# Patient Record
Sex: Female | Born: 1965 | Race: Black or African American | Hispanic: No | Marital: Married | Smoking: Former smoker
Health system: Southern US, Community
[De-identification: ages and names within clinical notes are randomized; demographics above are authoritative.]

## PROBLEM LIST (undated history)

## (undated) DIAGNOSIS — E213 Hyperparathyroidism, unspecified: Secondary | ICD-10-CM

## (undated) DIAGNOSIS — D649 Anemia, unspecified: Secondary | ICD-10-CM

## (undated) DIAGNOSIS — G4700 Insomnia, unspecified: Secondary | ICD-10-CM

## (undated) DIAGNOSIS — D126 Benign neoplasm of colon, unspecified: Secondary | ICD-10-CM

## (undated) DIAGNOSIS — K589 Irritable bowel syndrome without diarrhea: Secondary | ICD-10-CM

## (undated) DIAGNOSIS — E8881 Metabolic syndrome: Secondary | ICD-10-CM

## (undated) DIAGNOSIS — K219 Gastro-esophageal reflux disease without esophagitis: Secondary | ICD-10-CM

## (undated) DIAGNOSIS — R011 Cardiac murmur, unspecified: Secondary | ICD-10-CM

## (undated) DIAGNOSIS — Z720 Tobacco use: Secondary | ICD-10-CM

## (undated) DIAGNOSIS — I10 Essential (primary) hypertension: Secondary | ICD-10-CM

## (undated) DIAGNOSIS — G5640 Causalgia of unspecified upper limb: Secondary | ICD-10-CM

## (undated) DIAGNOSIS — K648 Other hemorrhoids: Secondary | ICD-10-CM

## (undated) DIAGNOSIS — M5020 Other cervical disc displacement, unspecified cervical region: Secondary | ICD-10-CM

## (undated) DIAGNOSIS — F329 Major depressive disorder, single episode, unspecified: Secondary | ICD-10-CM

## (undated) DIAGNOSIS — G9050 Complex regional pain syndrome I, unspecified: Secondary | ICD-10-CM

## (undated) DIAGNOSIS — E785 Hyperlipidemia, unspecified: Secondary | ICD-10-CM

## (undated) DIAGNOSIS — E210 Primary hyperparathyroidism: Secondary | ICD-10-CM

## (undated) DIAGNOSIS — G43909 Migraine, unspecified, not intractable, without status migrainosus: Secondary | ICD-10-CM

## (undated) DIAGNOSIS — R739 Hyperglycemia, unspecified: Secondary | ICD-10-CM

## (undated) DIAGNOSIS — E669 Obesity, unspecified: Secondary | ICD-10-CM

## (undated) DIAGNOSIS — K56699 Other intestinal obstruction unspecified as to partial versus complete obstruction: Secondary | ICD-10-CM

## (undated) DIAGNOSIS — K56609 Unspecified intestinal obstruction, unspecified as to partial versus complete obstruction: Secondary | ICD-10-CM

## (undated) DIAGNOSIS — K3184 Gastroparesis: Secondary | ICD-10-CM

## (undated) HISTORY — DX: Hyperglycemia, unspecified: R73.9

## (undated) HISTORY — DX: Hyperlipidemia, unspecified: E78.5

## (undated) HISTORY — DX: Complex regional pain syndrome I, unspecified: G90.50

## (undated) HISTORY — DX: Migraine, unspecified, not intractable, without status migrainosus: G43.909

## (undated) HISTORY — PX: HERNIA REPAIR: SHX51

## (undated) HISTORY — DX: Other cervical disc displacement, unspecified cervical region: M50.20

## (undated) HISTORY — DX: Insomnia, unspecified: G47.00

## (undated) HISTORY — DX: Gastroparesis: K31.84

## (undated) HISTORY — DX: Hyperparathyroidism, unspecified: E21.3

## (undated) HISTORY — PX: BACK SURGERY: SHX140

## (undated) HISTORY — PX: COLLATERAL LIGAMENT REPAIR, KNEE: SHX601

## (undated) HISTORY — DX: Essential (primary) hypertension: I10

## (undated) HISTORY — PX: SHOULDER ARTHROSCOPY W/ ROTATOR CUFF REPAIR: SHX2400

## (undated) HISTORY — DX: Cardiac murmur, unspecified: R01.1

## (undated) HISTORY — DX: Major depressive disorder, single episode, unspecified: F32.9

## (undated) HISTORY — PX: MYOMECTOMY: SHX85

## (undated) HISTORY — DX: Gastro-esophageal reflux disease without esophagitis: K21.9

## (undated) HISTORY — DX: Unspecified intestinal obstruction, unspecified as to partial versus complete obstruction: K56.609

## (undated) HISTORY — DX: Anemia, unspecified: D64.9

## (undated) HISTORY — PX: LUMBAR DISC SURGERY: SHX700

## (undated) HISTORY — DX: Metabolic syndrome: E88.810

## (undated) HISTORY — DX: Tobacco use: Z72.0

## (undated) HISTORY — DX: Benign neoplasm of colon, unspecified: D12.6

## (undated) HISTORY — DX: Primary hyperparathyroidism: E21.0

## (undated) HISTORY — DX: Causalgia of unspecified upper limb: G56.40

## (undated) HISTORY — DX: Metabolic syndrome: E88.81

## (undated) HISTORY — DX: Other intestinal obstruction unspecified as to partial versus complete obstruction: K56.699

## (undated) HISTORY — DX: Obesity, unspecified: E66.9

## (undated) HISTORY — DX: Other hemorrhoids: K64.8

---

## 1993-08-13 HISTORY — PX: TUBAL LIGATION: SHX77

## 1996-08-13 HISTORY — PX: ULNAR NERVE REPAIR: SHX2594

## 2003-08-14 HISTORY — PX: OTHER SURGICAL HISTORY: SHX169

## 2003-10-12 ENCOUNTER — Encounter: Payer: Self-pay | Admitting: Family Medicine

## 2003-10-12 LAB — CONVERTED CEMR LAB: Pap Smear: NORMAL

## 2004-09-11 ENCOUNTER — Emergency Department (HOSPITAL_COMMUNITY): Admission: EM | Admit: 2004-09-11 | Discharge: 2004-09-11 | Payer: Self-pay | Admitting: *Deleted

## 2005-01-19 ENCOUNTER — Encounter: Payer: Self-pay | Admitting: Family Medicine

## 2005-05-22 ENCOUNTER — Ambulatory Visit: Payer: Self-pay | Admitting: Internal Medicine

## 2005-05-23 ENCOUNTER — Ambulatory Visit: Payer: Self-pay | Admitting: Internal Medicine

## 2005-05-30 ENCOUNTER — Ambulatory Visit: Payer: Self-pay | Admitting: Internal Medicine

## 2005-05-30 ENCOUNTER — Ambulatory Visit (HOSPITAL_COMMUNITY): Admission: RE | Admit: 2005-05-30 | Discharge: 2005-05-30 | Payer: Self-pay | Admitting: Internal Medicine

## 2005-10-24 ENCOUNTER — Emergency Department (HOSPITAL_COMMUNITY): Admission: EM | Admit: 2005-10-24 | Discharge: 2005-10-25 | Payer: Self-pay | Admitting: Emergency Medicine

## 2005-10-29 ENCOUNTER — Ambulatory Visit: Payer: Self-pay | Admitting: Internal Medicine

## 2005-11-16 ENCOUNTER — Encounter: Admission: RE | Admit: 2005-11-16 | Discharge: 2006-02-14 | Payer: Self-pay | Admitting: Internal Medicine

## 2005-12-06 ENCOUNTER — Encounter
Admission: RE | Admit: 2005-12-06 | Discharge: 2006-03-06 | Payer: Self-pay | Admitting: Physical Medicine and Rehabilitation

## 2005-12-06 ENCOUNTER — Ambulatory Visit: Payer: Self-pay | Admitting: Physical Medicine and Rehabilitation

## 2005-12-12 ENCOUNTER — Ambulatory Visit: Payer: Self-pay | Admitting: Internal Medicine

## 2005-12-21 ENCOUNTER — Encounter
Admission: RE | Admit: 2005-12-21 | Discharge: 2005-12-21 | Payer: Self-pay | Admitting: Physical Medicine and Rehabilitation

## 2006-02-24 ENCOUNTER — Encounter: Admission: RE | Admit: 2006-02-24 | Discharge: 2006-02-24 | Payer: Self-pay | Admitting: Neurosurgery

## 2006-03-13 ENCOUNTER — Ambulatory Visit: Payer: Self-pay | Admitting: Internal Medicine

## 2006-06-24 ENCOUNTER — Ambulatory Visit: Payer: Self-pay | Admitting: Family Medicine

## 2006-09-19 ENCOUNTER — Observation Stay (HOSPITAL_COMMUNITY): Admission: AD | Admit: 2006-09-19 | Discharge: 2006-09-20 | Payer: Self-pay | Admitting: Family Medicine

## 2006-09-19 ENCOUNTER — Ambulatory Visit: Payer: Self-pay | Admitting: Family Medicine

## 2006-09-20 ENCOUNTER — Ambulatory Visit: Payer: Self-pay | Admitting: Internal Medicine

## 2006-09-25 ENCOUNTER — Ambulatory Visit: Payer: Self-pay | Admitting: Family Medicine

## 2006-10-08 ENCOUNTER — Ambulatory Visit: Payer: Self-pay | Admitting: Internal Medicine

## 2006-10-08 ENCOUNTER — Encounter: Payer: Self-pay | Admitting: Family Medicine

## 2006-11-10 ENCOUNTER — Emergency Department (HOSPITAL_COMMUNITY): Admission: EM | Admit: 2006-11-10 | Discharge: 2006-11-10 | Payer: Self-pay | Admitting: Emergency Medicine

## 2006-12-02 ENCOUNTER — Encounter: Admission: RE | Admit: 2006-12-02 | Discharge: 2006-12-02 | Payer: Self-pay | Admitting: Neurosurgery

## 2006-12-16 ENCOUNTER — Encounter: Payer: Self-pay | Admitting: Family Medicine

## 2007-01-17 ENCOUNTER — Emergency Department (HOSPITAL_COMMUNITY): Admission: EM | Admit: 2007-01-17 | Discharge: 2007-01-18 | Payer: Self-pay | Admitting: Emergency Medicine

## 2007-01-20 ENCOUNTER — Encounter (INDEPENDENT_AMBULATORY_CARE_PROVIDER_SITE_OTHER): Payer: Self-pay | Admitting: *Deleted

## 2007-04-02 ENCOUNTER — Telehealth: Payer: Self-pay | Admitting: Family Medicine

## 2007-05-06 ENCOUNTER — Telehealth: Payer: Self-pay | Admitting: Family Medicine

## 2007-06-05 ENCOUNTER — Telehealth: Payer: Self-pay | Admitting: Family Medicine

## 2007-06-17 ENCOUNTER — Encounter: Payer: Self-pay | Admitting: Family Medicine

## 2007-06-17 DIAGNOSIS — E785 Hyperlipidemia, unspecified: Secondary | ICD-10-CM | POA: Insufficient documentation

## 2007-06-17 DIAGNOSIS — D649 Anemia, unspecified: Secondary | ICD-10-CM

## 2007-06-17 DIAGNOSIS — F329 Major depressive disorder, single episode, unspecified: Secondary | ICD-10-CM

## 2007-06-17 DIAGNOSIS — I10 Essential (primary) hypertension: Secondary | ICD-10-CM | POA: Insufficient documentation

## 2007-06-17 DIAGNOSIS — K219 Gastro-esophageal reflux disease without esophagitis: Secondary | ICD-10-CM

## 2007-06-17 DIAGNOSIS — G9050 Complex regional pain syndrome I, unspecified: Secondary | ICD-10-CM | POA: Insufficient documentation

## 2007-06-17 DIAGNOSIS — K3184 Gastroparesis: Secondary | ICD-10-CM

## 2007-06-17 DIAGNOSIS — M5020 Other cervical disc displacement, unspecified cervical region: Secondary | ICD-10-CM | POA: Insufficient documentation

## 2007-06-17 DIAGNOSIS — E669 Obesity, unspecified: Secondary | ICD-10-CM

## 2007-06-17 DIAGNOSIS — F17200 Nicotine dependence, unspecified, uncomplicated: Secondary | ICD-10-CM | POA: Insufficient documentation

## 2007-06-17 DIAGNOSIS — G4700 Insomnia, unspecified: Secondary | ICD-10-CM | POA: Insufficient documentation

## 2007-06-17 DIAGNOSIS — E8881 Metabolic syndrome: Secondary | ICD-10-CM

## 2007-06-18 ENCOUNTER — Ambulatory Visit: Payer: Self-pay | Admitting: Family Medicine

## 2007-06-18 DIAGNOSIS — R5383 Other fatigue: Secondary | ICD-10-CM

## 2007-06-18 DIAGNOSIS — R5381 Other malaise: Secondary | ICD-10-CM

## 2007-06-19 LAB — CONVERTED CEMR LAB
BUN: 10 mg/dL (ref 6–23)
Basophils Absolute: 0.1 10*3/uL (ref 0.0–0.1)
Basophils Relative: 0.6 % (ref 0.0–1.0)
CO2: 31 meq/L (ref 19–32)
Calcium: 10.4 mg/dL (ref 8.4–10.5)
Chloride: 99 meq/L (ref 96–112)
Creatinine, Ser: 0.8 mg/dL (ref 0.4–1.2)
Eosinophils Absolute: 0.3 10*3/uL (ref 0.0–0.6)
Eosinophils Relative: 2.2 % (ref 0.0–5.0)
GFR calc Af Amer: 103 mL/min
GFR calc non Af Amer: 85 mL/min
Glucose, Bld: 104 mg/dL — ABNORMAL HIGH (ref 70–99)
HCT: 42.5 % (ref 36.0–46.0)
Hemoglobin: 14.8 g/dL (ref 12.0–15.0)
Hgb A1c MFr Bld: 6.1 % — ABNORMAL HIGH (ref 4.6–6.0)
Lymphocytes Relative: 40.3 % (ref 12.0–46.0)
MCHC: 34.8 g/dL (ref 30.0–36.0)
MCV: 85.8 fL (ref 78.0–100.0)
Monocytes Absolute: 0.9 10*3/uL — ABNORMAL HIGH (ref 0.2–0.7)
Monocytes Relative: 7.5 % (ref 3.0–11.0)
Neutro Abs: 5.5 10*3/uL (ref 1.4–7.7)
Neutrophils Relative %: 49.4 % (ref 43.0–77.0)
Platelets: 284 10*3/uL (ref 150–400)
Potassium: 3.3 meq/L — ABNORMAL LOW (ref 3.5–5.1)
RBC: 4.95 M/uL (ref 3.87–5.11)
RDW: 12.9 % (ref 11.5–14.6)
Sodium: 138 meq/L (ref 135–145)
TSH: 2.55 microintl units/mL (ref 0.35–5.50)
Vitamin B-12: 441 pg/mL (ref 211–911)
WBC: 11.4 10*3/uL — ABNORMAL HIGH (ref 4.5–10.5)

## 2007-06-20 LAB — CONVERTED CEMR LAB
BUN: 10 mg/dL (ref 6–23)
Basophils Absolute: 0.1 10*3/uL (ref 0.0–0.1)
Basophils Relative: 0.6 % (ref 0.0–1.0)
CO2: 31 meq/L (ref 19–32)
Calcium: 10.4 mg/dL (ref 8.4–10.5)
Chloride: 99 meq/L (ref 96–112)
Cholesterol: 207 mg/dL (ref 0–200)
Creatinine, Ser: 0.8 mg/dL (ref 0.4–1.2)
Direct LDL: 102.4 mg/dL
Eosinophils Absolute: 0.3 10*3/uL (ref 0.0–0.6)
Eosinophils Relative: 2.2 % (ref 0.0–5.0)
GFR calc Af Amer: 103 mL/min
GFR calc non Af Amer: 85 mL/min
Glucose, Bld: 104 mg/dL — ABNORMAL HIGH (ref 70–99)
HCT: 42.5 % (ref 36.0–46.0)
HDL: 32.4 mg/dL — ABNORMAL LOW (ref >39.0)
Hemoglobin: 14.8 g/dL (ref 12.0–15.0)
Hgb A1c MFr Bld: 6.1 % — ABNORMAL HIGH (ref 4.6–6.0)
Lymphocytes Relative: 40.3 % (ref 12.0–46.0)
MCHC: 34.8 g/dL (ref 30.0–36.0)
MCV: 85.8 fL (ref 78.0–100.0)
Monocytes Absolute: 0.9 10*3/uL — ABNORMAL HIGH (ref 0.2–0.7)
Monocytes Relative: 7.5 % (ref 3.0–11.0)
Neutro Abs: 5.5 10*3/uL (ref 1.4–7.7)
Neutrophils Relative %: 49.4 % (ref 43.0–77.0)
Platelets: 284 10*3/uL (ref 150–400)
Potassium: 3.3 meq/L — ABNORMAL LOW (ref 3.5–5.1)
RBC: 4.95 M/uL (ref 3.87–5.11)
RDW: 12.9 % (ref 11.5–14.6)
Sodium: 138 meq/L (ref 135–145)
TSH: 2.55 microintl units/mL (ref 0.35–5.50)
Total CHOL/HDL Ratio: 6.4
Triglycerides: 361 mg/dL (ref 0–149)
VLDL: 72 mg/dL — ABNORMAL HIGH (ref 0–40)
Vitamin B-12: 441 pg/mL (ref 211–911)
WBC: 11.4 10*3/uL — ABNORMAL HIGH (ref 4.5–10.5)

## 2007-07-02 ENCOUNTER — Ambulatory Visit: Payer: Self-pay | Admitting: Family Medicine

## 2007-07-02 LAB — CONVERTED CEMR LAB
CRP, High Sensitivity: 10 — ABNORMAL HIGH (ref 0.00–5.00)
Mono Screen: NEGATIVE
Rheumatoid fact SerPl-aCnc: <20 intl units/mL — ABNORMAL LOW (ref 0.0–20.0)
Sed Rate: 10 mm/hr (ref 0–25)

## 2007-07-04 LAB — CONVERTED CEMR LAB: Anti Nuclear Antibody(ANA): NEGATIVE

## 2007-07-16 ENCOUNTER — Telehealth: Payer: Self-pay | Admitting: Family Medicine

## 2007-07-21 ENCOUNTER — Telehealth (INDEPENDENT_AMBULATORY_CARE_PROVIDER_SITE_OTHER): Payer: Self-pay | Admitting: *Deleted

## 2007-07-31 ENCOUNTER — Encounter: Admission: RE | Admit: 2007-07-31 | Discharge: 2007-08-13 | Payer: Self-pay | Admitting: Family Medicine

## 2007-08-19 ENCOUNTER — Telehealth: Payer: Self-pay | Admitting: Family Medicine

## 2007-08-22 ENCOUNTER — Telehealth: Payer: Self-pay | Admitting: Family Medicine

## 2007-09-11 ENCOUNTER — Telehealth: Payer: Self-pay | Admitting: Family Medicine

## 2007-09-18 ENCOUNTER — Encounter (INDEPENDENT_AMBULATORY_CARE_PROVIDER_SITE_OTHER): Payer: Self-pay | Admitting: *Deleted

## 2007-09-22 ENCOUNTER — Telehealth: Payer: Self-pay | Admitting: Family Medicine

## 2007-09-24 ENCOUNTER — Telehealth: Payer: Self-pay | Admitting: Family Medicine

## 2007-10-17 ENCOUNTER — Telehealth: Payer: Self-pay | Admitting: Family Medicine

## 2007-11-24 ENCOUNTER — Telehealth: Payer: Self-pay | Admitting: Family Medicine

## 2007-12-26 ENCOUNTER — Telehealth: Payer: Self-pay | Admitting: Family Medicine

## 2008-01-08 ENCOUNTER — Telehealth: Payer: Self-pay | Admitting: Family Medicine

## 2008-01-22 ENCOUNTER — Telehealth: Payer: Self-pay | Admitting: Family Medicine

## 2008-01-23 ENCOUNTER — Telehealth: Payer: Self-pay | Admitting: Family Medicine

## 2008-02-03 ENCOUNTER — Telehealth: Payer: Self-pay | Admitting: Family Medicine

## 2008-02-11 ENCOUNTER — Telehealth: Payer: Self-pay | Admitting: Family Medicine

## 2008-02-19 ENCOUNTER — Ambulatory Visit: Payer: Self-pay | Admitting: Family Medicine

## 2008-02-19 ENCOUNTER — Telehealth: Payer: Self-pay | Admitting: Family Medicine

## 2008-02-25 ENCOUNTER — Telehealth: Payer: Self-pay | Admitting: Family Medicine

## 2008-03-11 ENCOUNTER — Telehealth: Payer: Self-pay | Admitting: Family Medicine

## 2008-03-19 ENCOUNTER — Telehealth: Payer: Self-pay | Admitting: Family Medicine

## 2008-03-25 ENCOUNTER — Telehealth: Payer: Self-pay | Admitting: Family Medicine

## 2008-04-29 ENCOUNTER — Telehealth: Payer: Self-pay | Admitting: Family Medicine

## 2008-04-30 ENCOUNTER — Telehealth: Payer: Self-pay | Admitting: Family Medicine

## 2008-04-30 ENCOUNTER — Encounter: Payer: Self-pay | Admitting: Family Medicine

## 2008-05-05 ENCOUNTER — Ambulatory Visit: Payer: Self-pay | Admitting: Family Medicine

## 2008-05-11 ENCOUNTER — Telehealth: Payer: Self-pay | Admitting: Family Medicine

## 2008-05-19 ENCOUNTER — Ambulatory Visit: Payer: Self-pay | Admitting: Family Medicine

## 2008-05-19 DIAGNOSIS — H00029 Hordeolum internum unspecified eye, unspecified eyelid: Secondary | ICD-10-CM | POA: Insufficient documentation

## 2008-05-19 DIAGNOSIS — J309 Allergic rhinitis, unspecified: Secondary | ICD-10-CM | POA: Insufficient documentation

## 2008-05-20 ENCOUNTER — Telehealth: Payer: Self-pay | Admitting: Family Medicine

## 2008-05-26 ENCOUNTER — Ambulatory Visit: Payer: Self-pay | Admitting: Family Medicine

## 2008-06-04 ENCOUNTER — Encounter (INDEPENDENT_AMBULATORY_CARE_PROVIDER_SITE_OTHER): Payer: Self-pay | Admitting: *Deleted

## 2008-06-11 ENCOUNTER — Telehealth: Payer: Self-pay | Admitting: Family Medicine

## 2008-06-28 ENCOUNTER — Encounter (INDEPENDENT_AMBULATORY_CARE_PROVIDER_SITE_OTHER): Payer: Self-pay | Admitting: *Deleted

## 2008-07-06 ENCOUNTER — Telehealth: Payer: Self-pay | Admitting: Family Medicine

## 2008-07-09 ENCOUNTER — Telehealth: Payer: Self-pay | Admitting: Family Medicine

## 2008-07-12 ENCOUNTER — Telehealth: Payer: Self-pay | Admitting: Family Medicine

## 2008-07-14 ENCOUNTER — Ambulatory Visit: Payer: Self-pay | Admitting: Family Medicine

## 2008-07-14 DIAGNOSIS — R209 Unspecified disturbances of skin sensation: Secondary | ICD-10-CM

## 2008-07-19 LAB — CONVERTED CEMR LAB
ALT: 42 units/L — ABNORMAL HIGH (ref 0–35)
AST: 34 units/L (ref 0–37)
Albumin: 3.8 g/dL (ref 3.5–5.2)
Alkaline Phosphatase: 73 units/L (ref 39–117)
BUN: 9 mg/dL (ref 6–23)
Basophils Absolute: 0 10*3/uL (ref 0.0–0.1)
Basophils Relative: 0.4 % (ref 0.0–3.0)
Bilirubin, Direct: 0.1 mg/dL (ref 0.0–0.3)
CO2: 30 meq/L (ref 19–32)
Calcium: 10.1 mg/dL (ref 8.4–10.5)
Chloride: 104 meq/L (ref 96–112)
Cholesterol: 186 mg/dL (ref 0–200)
Creatinine, Ser: 0.8 mg/dL (ref 0.4–1.2)
Direct LDL: 103.5 mg/dL
Eosinophils Absolute: 0.2 10*3/uL (ref 0.0–0.7)
Eosinophils Relative: 2.9 % (ref 0.0–5.0)
Folate: 16.2 ng/mL
GFR calc Af Amer: 102 mL/min
GFR calc non Af Amer: 84 mL/min
Glucose, Bld: 90 mg/dL (ref 70–99)
HCT: 44.3 % (ref 36.0–46.0)
HDL: 38.5 mg/dL — ABNORMAL LOW (ref >39.0)
Hemoglobin: 15.5 g/dL — ABNORMAL HIGH (ref 12.0–15.0)
Lymphocytes Relative: 46.7 % — ABNORMAL HIGH (ref 12.0–46.0)
MCHC: 35.1 g/dL (ref 30.0–36.0)
MCV: 85.7 fL (ref 78.0–100.0)
Monocytes Absolute: 0.6 10*3/uL (ref 0.1–1.0)
Monocytes Relative: 6.9 % (ref 3.0–12.0)
Neutro Abs: 3.7 10*3/uL (ref 1.4–7.7)
Neutrophils Relative %: 43.1 % (ref 43.0–77.0)
Platelets: 244 10*3/uL (ref 150–400)
Potassium: 3.6 meq/L (ref 3.5–5.1)
RBC: 5.17 M/uL — ABNORMAL HIGH (ref 3.87–5.11)
RDW: 13.1 % (ref 11.5–14.6)
Sodium: 139 meq/L (ref 135–145)
TSH: 1.71 microintl units/mL (ref 0.35–5.50)
Total Bilirubin: 0.8 mg/dL (ref 0.3–1.2)
Total CHOL/HDL Ratio: 4.8
Total Protein: 7 g/dL (ref 6.0–8.3)
Triglycerides: 238 mg/dL (ref 0–149)
VLDL: 48 mg/dL — ABNORMAL HIGH (ref 0–40)
Vitamin B-12: 522 pg/mL (ref 211–911)
WBC: 8.5 10*3/uL (ref 4.5–10.5)

## 2008-08-05 ENCOUNTER — Telehealth: Payer: Self-pay | Admitting: Family Medicine

## 2008-08-16 ENCOUNTER — Telehealth: Payer: Self-pay | Admitting: Family Medicine

## 2008-08-16 ENCOUNTER — Encounter: Payer: Self-pay | Admitting: Family Medicine

## 2008-09-20 ENCOUNTER — Telehealth: Payer: Self-pay | Admitting: Family Medicine

## 2008-10-20 ENCOUNTER — Telehealth: Payer: Self-pay | Admitting: Family Medicine

## 2008-10-28 ENCOUNTER — Encounter (INDEPENDENT_AMBULATORY_CARE_PROVIDER_SITE_OTHER): Payer: Self-pay | Admitting: *Deleted

## 2008-10-29 ENCOUNTER — Telehealth: Payer: Self-pay | Admitting: Family Medicine

## 2008-11-05 ENCOUNTER — Telehealth: Payer: Self-pay | Admitting: Family Medicine

## 2008-11-11 ENCOUNTER — Telehealth: Payer: Self-pay | Admitting: Family Medicine

## 2008-11-19 ENCOUNTER — Telehealth: Payer: Self-pay | Admitting: Family Medicine

## 2008-12-10 ENCOUNTER — Ambulatory Visit: Payer: Self-pay | Admitting: Family Medicine

## 2008-12-10 DIAGNOSIS — H6690 Otitis media, unspecified, unspecified ear: Secondary | ICD-10-CM | POA: Insufficient documentation

## 2008-12-21 ENCOUNTER — Telehealth: Payer: Self-pay | Admitting: Family Medicine

## 2009-01-24 ENCOUNTER — Telehealth: Payer: Self-pay | Admitting: Family Medicine

## 2009-02-22 ENCOUNTER — Telehealth: Payer: Self-pay | Admitting: Family Medicine

## 2009-03-10 ENCOUNTER — Encounter: Payer: Self-pay | Admitting: Family Medicine

## 2009-03-18 ENCOUNTER — Encounter: Payer: Self-pay | Admitting: Family Medicine

## 2009-03-25 ENCOUNTER — Telehealth: Payer: Self-pay | Admitting: Family Medicine

## 2009-03-29 ENCOUNTER — Encounter: Payer: Self-pay | Admitting: Family Medicine

## 2009-03-30 ENCOUNTER — Telehealth: Payer: Self-pay | Admitting: Family Medicine

## 2009-04-19 ENCOUNTER — Telehealth: Payer: Self-pay | Admitting: Family Medicine

## 2009-04-27 ENCOUNTER — Telehealth: Payer: Self-pay | Admitting: Family Medicine

## 2009-05-12 ENCOUNTER — Telehealth: Payer: Self-pay | Admitting: Family Medicine

## 2009-05-19 ENCOUNTER — Ambulatory Visit: Payer: Self-pay | Admitting: Family Medicine

## 2009-05-19 DIAGNOSIS — H1045 Other chronic allergic conjunctivitis: Secondary | ICD-10-CM

## 2009-05-27 ENCOUNTER — Telehealth: Payer: Self-pay | Admitting: Family Medicine

## 2009-06-27 ENCOUNTER — Telehealth: Payer: Self-pay | Admitting: Family Medicine

## 2009-07-01 ENCOUNTER — Telehealth: Payer: Self-pay | Admitting: Family Medicine

## 2009-07-06 ENCOUNTER — Ambulatory Visit: Payer: Self-pay | Admitting: Family Medicine

## 2009-07-22 ENCOUNTER — Ambulatory Visit: Payer: Self-pay | Admitting: Family Medicine

## 2009-07-22 DIAGNOSIS — Z87898 Personal history of other specified conditions: Secondary | ICD-10-CM | POA: Insufficient documentation

## 2009-07-25 ENCOUNTER — Telehealth: Payer: Self-pay | Admitting: Family Medicine

## 2009-07-27 ENCOUNTER — Encounter: Payer: Self-pay | Admitting: Family Medicine

## 2009-08-01 ENCOUNTER — Telehealth: Payer: Self-pay | Admitting: Family Medicine

## 2009-08-10 ENCOUNTER — Telehealth: Payer: Self-pay | Admitting: Family Medicine

## 2009-08-15 ENCOUNTER — Telehealth: Payer: Self-pay | Admitting: Family Medicine

## 2009-08-22 ENCOUNTER — Telehealth (INDEPENDENT_AMBULATORY_CARE_PROVIDER_SITE_OTHER): Payer: Self-pay | Admitting: *Deleted

## 2009-08-26 ENCOUNTER — Telehealth: Payer: Self-pay | Admitting: Family Medicine

## 2009-09-21 ENCOUNTER — Telehealth: Payer: Self-pay | Admitting: Family Medicine

## 2009-10-05 ENCOUNTER — Telehealth: Payer: Self-pay | Admitting: Family Medicine

## 2010-05-01 ENCOUNTER — Encounter: Admission: RE | Admit: 2010-05-01 | Discharge: 2010-05-01 | Payer: Self-pay | Admitting: Gastroenterology

## 2010-05-08 ENCOUNTER — Encounter: Admission: RE | Admit: 2010-05-08 | Discharge: 2010-05-08 | Payer: Self-pay | Admitting: Gastroenterology

## 2010-05-15 ENCOUNTER — Ambulatory Visit (HOSPITAL_COMMUNITY): Admission: RE | Admit: 2010-05-15 | Discharge: 2010-05-15 | Payer: Self-pay | Admitting: Gastroenterology

## 2010-05-19 ENCOUNTER — Encounter: Admission: RE | Admit: 2010-05-19 | Discharge: 2010-05-19 | Payer: Self-pay | Admitting: Gastroenterology

## 2010-06-01 ENCOUNTER — Ambulatory Visit (HOSPITAL_COMMUNITY): Admission: RE | Admit: 2010-06-01 | Discharge: 2010-06-01 | Payer: Self-pay | Admitting: Gastroenterology

## 2010-09-03 ENCOUNTER — Encounter: Payer: Self-pay | Admitting: Physical Medicine and Rehabilitation

## 2010-09-12 NOTE — Progress Notes (Signed)
Summary: refill requests for kadian, MSIR  Phone Note Refill Request Call back at Home Phone [phone] Message from:  Patient  Refills Requested: Medication #1:  KADIAN 60 MG  CP24 1 tab by mouth two times a day ( in addition to 30 mg two times a day )  Medication #2:  KADIAN 30 MG XR24H-CAP 1 tab by mouth two times a day in addition to the 60 mg two times a day already using.  Medication #3:  MSIR 30 MG 1 tab po q 6 hours  as needed breakthru pain Phoned request from pt.  She is asking if she can have a few more msir this month because she has been using more due to the weather.  She needs more added to this script because her insurance will only pay for one script per month.  Please call when ready.  Initial call taken by: Lowella Petties CMA,  September 21, 2009 8:29 AM  Follow-up for Phone Call        Left message on cell phone voicemail  that Rx ready for pick up. Follow-up by: Linde Gillis CMA Duncan Dull),  September 21, 2009 10:37 AM    Prescriptions: MSIR 30 MG 1 tab po q 6 hours  as needed breakthru pain  #45 x 0   Entered and Authorized by:   Kerby Nora MD   Signed by:   Kerby Nora MD on 09/21/2009   Method used:   Print then Give to Patient   RxID:   [id number] KADIAN 60 MG  CP24 (MORPHINE SULFATE) 1 tab by mouth two times a day ( in addition to 30 mg two times a day )  #60 x 0   Entered and Authorized by:   Kerby Nora MD   Signed by:   Kerby Nora MD on 09/21/2009   Method used:   Print then Give to Patient   RxID:   [id number] KADIAN 30 MG XR24H-CAP (MORPHINE SULFATE) 1 tab by mouth two times a day in addition to the 60 mg two times a day already using.  #60 x 0   Entered and Authorized by:   Kerby Nora MD   Signed by:   Kerby Nora MD on 09/21/2009   Method used:   Print then Give to Patient   RxID:   [id number]

## 2010-09-12 NOTE — Progress Notes (Signed)
Summary: Followup on MSIR rx.  Phone Note Call from Patient Call back at [phone] or [phone]   Caller: Patient Call For: Kerby Nora MD Summary of Call: Pt called last week for MSIR rx. and did not get call back. I called [facility] is there and ready. I called pt apologized for not getting a call back last week but med is at [facility] and ready for pick up. Pt thanked me said that was OK and will pick up med today. Initial call taken by: Lewanda Rife LPN,  August 15, 2009 9:46 AM

## 2010-09-12 NOTE — Progress Notes (Signed)
Summary: MSIR   Phone Note Call from Patient Call back at [facility] Phone [phone] Call back at [phone]   Caller: Patient Call For: Dr. Ermalene Searing Summary of Call: pt calling upset that she has been waiting all week for return call from Dr. Ermalene Searing or Dr. Patsy Lager. Pt states that because of the weather she uses more pain medication because the weather makes the pain worse. Patient states that the pharmacy will not fill MSIR 30 MG 3 tab by mouth daily as needed breakthru pain, because she is using more than what the rx states. Pt would like for you to re-write the instructions to reflect she is taking more because of the weather. Pt can't get rx filled because she is due until 09/03/2009, pt uses [facility]. Initial call taken by: Mervin Hack CMA Duncan Dull),  August 26, 2009 4:39 PM  Follow-up for Phone Call        Will temporarily change MSIR prescription to 4 times daily, but if continue high use..will need to return to pain center for KAdian adjustment.  please discard other MSIR rx.Dajha Urquilla will need to come pick up MSIR prescription.  Follow-up by: Kerby Nora MD,  August 26, 2009 5:13 PM  Additional Follow-up for Phone Call Additional follow up Details #1::        Patient says that she would like a referral to pain clinic as she thought this was already in the process of being set up.  Rob from [place] to come by office to pick up Rx. Additional Follow-up by: Linde Gillis CMA Duncan Dull),  August 26, 2009 5:42 PM    New/Updated Medications: * MSIR 30 MG 1 tab po q 6 hours  as needed breakthru pain Prescriptions: MSIR 30 MG 1 tab po q 6 hours  as needed breakthru pain  #30 x 0   Entered and Authorized by:   Kerby Nora MD   Signed by:   Kerby Nora MD on 08/26/2009   Method used:   Print then Give to Patient   RxID:   [id number]

## 2010-09-12 NOTE — Progress Notes (Signed)
Summary: wants phone call   Phone Note Call from Patient Call back at [facility] Phone [phone] Call back at [phone]   Caller: Patient Call For: Kerby Nora MD/ Dr. Patsy Lager Summary of Call: Patient is requesting phone call from Dr. Patsy Lager regarding her pain medication.  Initial call taken by: Melody Comas,  August 22, 2009 4:32 PM  Follow-up for Phone Call        i am going to defer question about pain medicines to Dr. Ermalene Searing who is in tomorrow Follow-up by: Hannah Beat MD,  August 22, 2009 4:47 PM  Additional Follow-up for Phone Call Additional follow up Details #1::        Will call back when in office again on Friday...if she has specific concerns that need to be adressed sooner...take message and resend me a phone note.  Additional Follow-up by: Kerby Nora MD,  August 23, 2009 3:50 PM    Additional Follow-up for Phone Call Additional follow up Details #2::    patient says that she was worried about the weather but she has figured it out herself Follow-up by: Benny Lennert CMA Duncan Dull),  August 24, 2009 10:46 AM

## 2010-09-12 NOTE — Progress Notes (Signed)
Summary: needs records sent to pain mgmt clinic  Phone Note Call from Patient Call back at [facility] Phone [phone] Call back at [phone]   Caller: Patient Call For: Kerby Nora MD Summary of Call: Patient wants phone call from Dr. Daphine Deutscher nurse.  Initial call taken by: Melody Comas,  October 05, 2009 3:24 PM  Follow-up for Phone Call        Pt has found a pain management doctor at [facility].  His name is Sheran Luz and they need pt's records sent to them at fax number [phone].  He wants meds prescribed for the last few months, last office notes for the last few months. Follow-up by: Lowella Petties CMA,  October 05, 2009 3:57 PM  Additional Follow-up for Phone Call Additional follow up Details #1::        Please send requested info.  Additional Follow-up by: Kerby Nora MD,  October 05, 2009 5:16 PM    Additional Follow-up for Phone Call Additional follow up Details #2::    Faxed over 23 pages of documentation including complete medication list, and last three office visit notes beginning with the last note on 07/22/2009.  Faxed to Sheran Luz at [phone]. Follow-up by: Linde Gillis CMA Duncan Dull),  October 07, 2009 12:32 PM   Appended Document: needs records sent to pain mgmt clinic Patient notified that information was sent today via fax.

## 2010-09-12 NOTE — Progress Notes (Signed)
Summary: Kadian and MSIR rx  Phone Note Refill Request Call back at [phone] Message from:  Patient on August 26, 2009 2:44 PM  Refills Requested: Medication #1:  KADIAN 60 MG  CP24 1 tab by mouth two times a day ( in addition to 30 mg two times a day )  Medication #2:  KADIAN 30 MG XR24H-CAP 1 tab by mouth two times a day in addition to the 60 mg two times a day already using.  Medication #3:  MSIR 30 MG 3 tab by mouth daily as needed breakthru pain Pt request refill for Kadian and MSIR. Please call pt when ready for pick up at [phone].Thank you.   Method Requested: Pick up at Office Initial call taken by: Lewanda Rife LPN,  August 26, 2009 2:45 PM  Follow-up for Phone Call        Patient notified, Rx left at front desk for pickup Follow-up by: Linde Gillis CMA Duncan Dull),  August 26, 2009 3:31 PM    Prescriptions: MSIR 30 MG 3 tab by mouth daily as needed breakthru pain  #30 x 0   Entered and Authorized by:   Kerby Nora MD   Signed by:   Kerby Nora MD on 08/26/2009   Method used:   Print then Give to Patient   RxID:   [id number] KADIAN 30 MG XR24H-CAP (MORPHINE SULFATE) 1 tab by mouth two times a day in addition to the 60 mg two times a day already using.  #60 x 0   Entered and Authorized by:   Kerby Nora MD   Signed by:   Kerby Nora MD on 08/26/2009   Method used:   Print then Give to Patient   RxID:   [id number] KADIAN 60 MG  CP24 (MORPHINE SULFATE) 1 tab by mouth two times a day ( in addition to 30 mg two times a day )  #60 x 0   Entered and Authorized by:   Kerby Nora MD   Signed by:   Kerby Nora MD on 08/26/2009   Method used:   Print then Give to Patient   RxID:   [id number]

## 2010-12-26 NOTE — Assessment & Plan Note (Signed)
[facility]                                 ON-CALL NOTE   SHUNTIA, EXTON                    MRN:          [id number]  DATE:01/17/2007                            DOB:          1965/10/07    Phone number [phone].   CHIEF COMPLAINT:  Vomiting.  Patient says that she has been vomiting  since Tuesday, everything she eats.  She has just had sips of water  today.  Took 2 TUMS, and is still vomiting.  Now, she said she is  projectile vomiting with no control over it.  Denies diarrhea or fever.  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  She takes  morphine twice a day, and she thinks she is in withdrawal.  Her regular  doctor, Dr. Ermalene Searing, went out on maternity leave this week, and she said  she has not called the office because no one will believe her, and they  think she is just a drug seeker.  She said that she is making no tears  now when she cries.  Her lips are chapped.  She thinks she is very  dehydrated, and cannot stop vomiting.  I instructed her to go to the  emergency room for evaluation now, because she may need rehydration with  IV, and this is what she is going to do.     Marne A. Tower, MD  Electronically Signed    MAT/MedQ  DD: 01/17/2007  DT: 01/18/2007  Job #: [id number]   cc:   Kerby Nora, MD

## 2010-12-26 NOTE — Assessment & Plan Note (Signed)
[facility]                                 ON-CALL NOTE   PRUDIE, GUTHRIDGE                    MRN:          [id number]  DATE:01/18/2007                            DOB:          1966-06-10    A patient of Dr. Daphine Deutscher, at around 1 o'clock p.m., phone number is  [phone].   CHIEF COMPLAINT:  Vomiting.  The patient says that she has continued  nausea and vomiting from narcotic withdrawal.  She cannot take any  medications.  She went to the emergency room, they did nothing but give  her nausea medicine.  She said her nausea and vomiting has slowed down,  but she does not know what to do because she feels so bad and she knows  the vomiting will return.  She wants medicine to treat her withdrawal  from narcotics.  I told her I could not do this over the phone, and  advised her that if she if still having nausea and vomiting to go back  to the emergency room.  She said she would continue to use the nausea  medicine they gave her at the current time and go to the emergency room  if she gets worse.     Marne A. Tower, MD  Electronically Signed    MAT/MedQ  DD: 01/18/2007  DT: 01/18/2007  Job #: [id number]

## 2010-12-29 NOTE — Discharge Summary (Signed)
NAME:  Emma Munoz, Emma Munoz NO.:  [id number]   MEDICAL RECORD NO.:  [id number]          PATIENT TYPE:  OBV   LOCATION:  5524                         FACILITY:  [facility]   PHYSICIAN:  Valerie A. Felicity Coyer, MDDATE OF BIRTH:  21-Aug-1965   DATE OF ADMISSION:  09/19/2006  DATE OF DISCHARGE:  09/20/2006                               DISCHARGE SUMMARY   DISCHARGE DIAGNOSIS:  1. Nausea and vomiting likely secondary to gastroparesis and      constipation in the setting of chronic narcotic use.  2. Mild dehydration.  3. Mild tachycardia secondary to dehydration.  4. Chronic pain  5. Hypertension.  6. Hypokalemia.   HISTORY OF PRESENT ILLNESS:  Ms. Mckeon is a 45 year old female who  presented to her primary care's office on September 19, 2005, with a chief  complaint of intractable nausea and vomiting.  She has a known history  of gastroparesis. She is also followed in the pain clinic for chronic  pain after an injury to her left hand. She reports a one week history of  occasional nausea and vomiting which became worse on the evening prior  to admission.  She reported vomiting dark brown foul smelling copious  emesis which she believes was feces.  She has been unable to keep down  her pain medications for 48 hours prior to admission. She was admitted  for further evaluation and treatment.   PAST MEDICAL HISTORY:  1. Hypertension.  2. Depression.  3. Insomnia.  4. Chronic pain with history of reflex sympathetic dystrophy.  5. Gastroesophageal reflux disease.  6. Gastroparesis secondary to chronic pain medications.  7. Tobacco abuse.  8. Obesity.  9. Anemia.  10.Hypertriglyceridemia.  11.Metabolic syndrome/pre-diabetes.   COURSE OF HOSPITALIZATION:  1. Nausea and vomiting likely secondary to gastroparesis and      constipation in the setting of chronic narcotic use. The patient      was admitted and underwent a KUB which showed no acute abdominal      abnormalities  except probable constipation.  She also underwent a      chest x-ray which noted chronic bronchitis versus asthmatic      changes.  The patient did have a bowel movement and is currently      tolerating p.o.'s.  As she states she does not have daily bowel      movements and uses MiraLax p.r.n., it was recommended to the      patient to try MiraLax once daily to try to regulate bowel pattern      and prevent constipation   1. Hypokalemia.  The patient was noted to have a potassium of 3.1.      This was taken prior to IV fluid which had 20 mEq of potassium and      we will give an additional 20 mEq p.o. prior to discharge.   DISCHARGE MEDICATIONS:  1. MiraLax 17 grams once daily.  2. Kadian CR 60 mg twice daily.  3. Metoprolol/HCTZ 100/25 1 tablet p.o. daily.  4. Cymbalta 60 mg p.o. daily.  5. K-Dur 20 mEq p.o. daily.  6. Nexium 40  mEq p.o. daily.  7. Morphine sulfate 30 mg as needed per pain clinic instructions.  8. Diclopramide 10 mg p.o. b.i.d.  9. Chantix 1mg  p.o. b.i.d.  10.Flexeril 10 mg as needed.   DISPOSITION:  The patient will be discharged to home.   PERTINENT LABORATORIES AT TIME OF DISCHARGE:  Potassium 3.1, BUN 7,  creatinine 0.64, hemoglobin 15.3, hematocrit 44.8, lipase 16.   FOLLOW-UP:  The patient is instructed follow up with Dr. Ermalene Searing been  slow on February 13 at 11 a.m. She is instructed to call Dr. Ermalene Searing or  return to the emergency room should she develop recurrent nausea,  vomiting or inability to keep down food or medicines.      Sandford Craze, NP      Raenette Rover. Felicity Coyer, MD  Electronically Signed    MO/MEDQ  D:  09/20/2006  T:  09/20/2006  Job:  [id number]   cc:   Kerby Nora, MD

## 2010-12-29 NOTE — Assessment & Plan Note (Signed)
[facility]                                 ON-CALL NOTE   RAMYAH, PANKOWSKI                    MRN:          [id number]  DATE:12/07/2006                            DOB:          1966-03-22    TELEPHONE PROGRESS NOTE:  Telephone number is [phone].   PROGRESS NOTE:  Nursing assistant discussed with her, that the patient  complained of withdrawal from medicine of time released morphine because  there was a mix up on her medication. She has been out of her medicine  and the insurance would not fill it. She is having some problems with  body shaking, cold sweats, arm pain, and throwing up. I recommended that  she be evaluated through the emergency room. It is not possible to go to  a direct admit without observing the patient specifically. The patient  stated that she has had this happen before and Dr. Ermalene Searing admitted her  from the office for a similar thing. I recommended that today though,  because I am not her primary physician, she be evaluated in the  emergency room. I discussed with Dr. Ermalene Searing that this not happen again.     Neta Mends. Fabian Sharp, MD  Electronically Signed    WKP/MedQ  DD: 12/07/2006  DT: 12/07/2006  Job #: [id number]   cc:   Kerby Nora, MD
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
## 2010-12-29 NOTE — Assessment & Plan Note (Signed)
[facility]                           [place] OFFICE NOTE   Emma Munoz, Emma Munoz                    MRN:          [id number]  DATE:09/19/2006                            DOB:          09-Jan-1966    HOSPITAL ADMISSION   CHIEF COMPLAINT:  Intractable nausea and vomiting with history of  gastroparesis.   HISTORY OF PRESENT ILLNESS:  Emma Munoz is a 45 year old female with  history of GERD, gastroparesis, and chronic pain on chronic narcotics  who presents with 1 week of occasional nausea and vomiting and belching  which gradually worsened until last night when she began vomiting  repeatedly.  The emesis was dark brown, foul smelling, and copious.  She  did not see any blood.  She did state that it smelled like feces.  She  also reports epigastric and central abdominal cramping, moderate in  intensity.  She has not been able to keep down pain medications in 48  hours and she has also not been able to tolerate liquids since last  night.  Her last bowel movement was 2 days ago.  She has Phenergan and  metoclopramide at home that she should be taking regularly but she  cannot keep these down.  She has been hospitalized for similar symptoms  in the past.  She states that she has had workup of stomach emptying  which I do not have documentation of in the past that showed  gastroparesis.   REVIEW OF SYSTEMS:  Tachycardia, weak, fatigue.  No fever or chills.  No  sick contacts.  No diarrhea.  No blood in the stool.   PAST MEDICAL HISTORY:  1. Hypertension.  2. Depression.  3. Insomnia.  4. Chronic pain (reflex sympathetic dystrophy).  5. GERD.  6. Gastroparesis from chronic pain medication.  7. Tobacco abuse.  8. Obesity.  9. Anemia.  10.Hypertriglyceridemia.  11.Metabolic syndrome/prediabetes.   SURGICAL HISTORY:  1. 1998 hand crush injury with a power jack resulting in chronic pain.  2. Five hand and arm surgeries since 1998.  3.  Hysterectomy for fibroids, no BSO.  4. Knee surgery on the left.  5. Three back surgeries for herniated disk.  6. Three C-sections.  7. 2006 EGD negative.  8. Colonoscopy negative in 2006.   FAMILY HISTORY:  Father deceased at age 39 secondary to assault,  otherwise healthy.  Mother alive at age 20, healthy.  One brother with  end-stage renal disease.  No family history of cardiovascular disease or  diabetes.   SOCIAL HISTORY:  Smokes less than one pack per day, no alcohol, no  drugs.  She has been married for 15 years and is disabled secondary to  her hand injury.  She has three children.   ALLERGIES:  ASPIRIN, IBUPROFEN, AND ALL NSAIDS BECAUSE THEY CAUSE  TONGUE, THROAT AND EXTREMITY SWELLING.   MEDICATIONS:  1. Kadian 60 mg p.o. b.i.d.  2. MSIR 30 mg three to four tablets q.4-6 h.  3. Chantix.  4. Cymbalta 60 mg daily.  5. Metoprolol/hydrochlorothiazide 100/12.5 mg daily.  6. Flexeril 10 mg two tablets p.o. daily.  7.  Potassium 20 mEq one tablet p.o. daily.  8. Metoclopramide 5 mg q.i.d.  9. Nexium 40 mg daily.  10.Lunesta 2 mg p.o. q.h.s. p.r.n.  11.Vitamin B complex 100 mg daily.  12.Multivitamin Weight Smart daily.   PHYSICAL EXAMINATION:  VITAL SIGNS:  Weight 226, temperature 98.4, pulse  112, blood pressure 134/90.  GENERAL:  Mild distress secondary to nausea, morbid obesity.  HEENT:  PERRLA.  Extraocular muscles intact.  Oropharynx clear.  Tympanic membranes clear.  Mucous membranes dry.  No thyromegaly.  No  cervical or supraclavicular lymphadenopathy.  CARDIOVASCULAR:  Tachycardia, 1/6 systolic murmur, normal PMI, 2+  peripheral pulses, no peripheral edema.  PULMONARY:  Clear to auscultation bilaterally.  No wheezes, rales, or  rhonchi.  ABDOMEN:  Epigastric and central tenderness to palpation moderate.  No  rebound, no guarding, no hepatosplenomegaly.  Normoactive bowel sounds.  NEURO:  Alert and oriented x3.  Cranial nerves II-XII grossly intact.    ASSESSMENT AND PLAN:  A 45 year old female with:   Problem 1. INTRACTABLE NAUSEA AND VOMITING.  Given her symptoms, this is  likely secondary to gastroparesis and less likely viral gastroenteritis.  Given her foul-smelling dark stool, I will check an acute abdominal  series to rule out partial obstruction.  She will be treated with IV  Reglan four times a day with Phenergan p.r.n. nausea.  I will check  electrolytes, lipase, and LFTs.   Problem 2. DEHYDRATION, MILD.  IV fluids at D5 1/2-normal saline with 20  mEq KCl at 150 mL/hr for 3 hours then decrease to 125 mL/hr.   Problem 3. TACHYCARDIA.  Likely secondary to #2 and possibly early  narcotic withdrawal.   Problem 4.  CHRONIC PAIN.  We will give pain medications in IV form.  The equivalent of Kadian 60 mg p.o. b.i.d. and morphine as 40 mg IV  divided daily.  I will begin it with this dose and this may be increased  as needed to prevent withdrawal and control the patient's pain while she  is in the hospital until she is tolerating p.o. medications.   Problem 5. HYPERTENSION.  Blood pressure is well controlled at this  point in time.  Once nausea and vomiting is controlled, she may restart  on her oral medications.     Kerby Nora, MD  Electronically Signed    AB/MedQ  DD: 09/19/2006  DT: 09/19/2006  Job #: [id number]

## 2010-12-29 NOTE — Assessment & Plan Note (Signed)
[facility]                         GASTROENTEROLOGY OFFICE NOTE   Emma Munoz                    MRN:          [id number]  DATE:10/08/2006                            DOB:          [birth year]    CHIEF COMPLAINT:  Nausea and vomiting, constipation/gastroparesis.   ASSESSMENT:  A 45 year old [place] American woman with chronic narcotic  use, which has induced gastroparesis and constipation.  She is now  having more difficulty than before.   PLAN:  1. Try some enemas, 1 or 2, to help things move from below, and then      use MiraLax 4 to 8 doses over 2 hours or so to see if that will not      promote a better bowel movement and improve these symptoms.  She is      to take her metoclopramide before the attempt to prep with some      MiraLax.  If this fails to work, and in particular, she develops      protracted vomiting, hospitalization could be needed, as she may go      into narcotic withdraw.  Beyond that, other than with MiraLax, we      will add some Amitiza too to see if she can tolerate that.  If      those samples help, once she has some bowel movements here, then we      can prescribe that.  Ultimately, I think the narcotics are the big      issue, which she cannot come off of due to her reflex sympathetic      dystrophy and chronic pain syndrome.   I have also requested records from a workup when she was hospitalized  (has been hospitalized twice for these problems) at [facility] in [place], [place].  She believes she had an upper GI  endoscopy, a colonoscopy and a gastric emptying study.   HISTORY:  A 45 year old [place] woman, as described above.  She  has been on chronic narcotics since 2002 due to chronic pain syndrome.  She had the development of left ulnar neuropathy and reflex sympathetic  dystrophy problems in the left arm after an injury to her left hand.  She has had several surgeries there.   For the past 2 years, she has had  difficulty with vomiting.  She was hospitalized in 2005 in [place],  [place] and had the workup described above.  Since that time, she had some  intermittent symptoms, but lately, they have been quite steady, and she  has not moved in her bowels in about 2 weeks at this time.  She did take  an enema, and had some results about 2 weeks ago.  She feels very  distended, and said her weight will go up, and then decline as she ends  up defecating.  She was hospitalized from September 19, 2006 to September 20, 2006 on the inpatient  Service, and she said that she drank  quite a bit of MiraLax, and eventually moved her bowels and felt  somewhat better.   PAST MEDICAL HISTORY:  1.  As above.  2. Hysterectomy.  3. Four surgeries in the left arm 1998, 2001, 2002.  4. Left and right knee surgery.  5. Obesity.  6. Three caesarian sections.  7. Two back surgeries.  8. Multiple fibroids removed 1983.   MEDICATIONS:  1. Kadian 60 mg twice daily.  2. Morphine sulfate IR 30 mg 3 to 4 every 4 to 6 hours.  3. Chantix.  4. Cymbalta 60 mg daily.  5. Metoprolol/hydrochlorothiazide 100/12.5 mg daily.  6. Flexeril 20 mg daily.  7. Potassium 20 mEq daily.  8. Metoclopramide 10 mg 4 times a day.  9. Nexium 40 mg daily.  10.Vitamin B complex 100 mg daily.  11.Multivitamin.  12.Weight Smart daily.  13.MiraLax daily.  14.Lunesta 2 mg at bedtime as needed.  15.Pseudoephedrine p.r.n.   DRUG ALLERGIES:  1. ASPIRIN.  2. NSAIDS CAUSE SWELLING OF THE TONGUE, THROAT AND EXTREMITIES.   Additional medical history includes metabolic syndrome and prediabetes,  hypertriglyceridemia, history of anemia, insomnia.   FAMILY HISTORY:  Prostate cancer in a grandfather.  Otherwise, our  review is negative, including no family history of colon cancer.   SOCIAL HISTORY:  She is married, 1 son, 2 daughters.  Has a college  education.  She lives with her husband and children.   She does smoke,  trying to quit.  No alcohol, tobacco or drugs otherwise.   REVIEW OF SYSTEMS:  See above, or see my medical history form.   PHYSICAL EXAMINATION:  Reveals an obese back woman, in no acute  distress.  Height 5 feet 8 inches.  Weight 232 pounds.  Blood pressure 102/72.  Pulse 72.  The eyes are anicteric.  NECK:  Supple.  No thyromegaly or mass.  CHEST:  Clear.  HEART:  S1 and S2.  I hear no murmurs or gallops.  ABDOMEN:  Distended.  Bowel sounds are present.  I do not hear an  obvious succussion splash.  It is soft.  It is mildly tender throughout.  RECTAL:  Exam in the presence of female nursing staff shows no  impaction.  There is no significant stool in the rectum.  LYMPHATIC:  No neck or supraclavicular nodes.  EXTREMITIES:  No peripheral edema.  SKIN:  Warm and dry.  No acute rash.  NEUROLOGIC/PSYCHIATRIC:  She is alert and oriented x3.   Data from her hospitalization:  Her abdominal films showed no acute  abnormalities, except what they described as probable constipation.  A  chest x-ray showed a chronic bronchitis versus asthmatic changes.  During that hospitalization she was eventually able to tolerate p.o. and  was discharged.  Her hemoglobin was 15.  Potassium was 3.1, which was  supplemented.  BUN 7.  Creatinine 0.64.  Lipase was 16.   I appreciate the opportunity to care for this patient.   I have not scheduled followup for this lady right at this time.  We will  see her in followup if needed.  She is to call me back with the response  to the Amitiza, and I will prescribe that if it is working, and if not,  we will need to think of other techniques.  Ultimately, if it would be  at all possible for her to get some non-narcotic pain relief, I think  she should try to pursue that option.  Perhaps, getting established with  a pain clinic, presumably she already is.  I have not discussed that with her further at this time.     Emma Munoz.  Emma Payor, MD,FACG   Electronically Signed    CEG/MedQ  DD: 10/08/2006  DT: 10/08/2006  Job #: [id number]   cc:   Kerby Nora, MD

## 2010-12-29 NOTE — Assessment & Plan Note (Signed)
[facility]                             [place] OFFICE NOTE   Emma Munoz, Emma Munoz                    MRN:          [id number]  DATE:06/24/2006                            DOB:          07/18/1966    CHIEF COMPLAINT:  A 45 year old-black-female here to establish new doctor.   HISTORY OF PRESENT ILLNESS:  Emma Munoz states that she previously saw  Dr. Artist Pais at a different  office.  She is changing doctors because of  location.  She has the following health issues.  1. Hypertension, well-controlled:  She currently takes      metoprolol/hydrochlorothiazide 100/12.5 mg daily.  She denies any side      effects from this medication.  2. Depression, stable:  She is currently on Chantix 60 mg daily.  She      states that this is controlling her depression fairly well.  She denies      suicidal or homicidal ideation.  3. Chronic pain, poor control.  Emma Munoz states that gradually her      pain is under worse and worse control.  She currently has an      appointment upcoming with Dr. Pamelia Hoit at a chronic pain center in      early December to adjust her medications.  She is currently on Kadian      60 mg p.o. b.i.d. as well as MSIR 30 mg 3 to 4 tablets q.4 to 6 hours      for breakthrough pain.  She also takes Flexeril 10 mg 2 tablets daily.      Of note, she has had some gastroparesis that is most-likely secondary      to these medications.  She takes metoclopramide 5 mg q.i.d.   REVIEW OF SYSTEMS:  Otherwise, negative.   PAST MEDICAL HISTORY:  1. Hypertension.  2. Depression.  3. Insomnia.  4. Chronic pain, left neck to hand.  5. Gastroesophageal reflux disease.  6. Gastroparesis secondary to pain medicine.  7. Tobacco abuse.  8. Herniated disk in neck.   HOSPITALIZATIONS, SURGERIES, PROCEDURES:  1. In 1998 crush hand injury from a power jack resulting in all chronic      pain issues.  2. In 1998 five hand and arm  surgeries.  3. Hysterectomy for fibroids.  4. Left knee surgery.  5. Three back surgeries for herniated disks.  6. Three C-sections.  7. In 2006 EGD, negative.  8. Mammogram 2004, negative.  9. Colonoscopy 2006, negative.   ALLERGIES:  ASPIRIN, IBUPROFEN, and ALL NSAIDS cause tongue, throat, and  extremity swelling.   MEDICATIONS:  1. Kadian 60 mg p.o. b.i.d.  2. MSIR 30 mg 3 to 4 tablets q. 4 to 6 hours.  3. Chantix unknown dose.  4. Cymbalta 60 mg daily.  5. Metoprolol hydrochlorothiazide 100/12.5.  6. Flexeril 10 mg 2 tablets daily.  7. Potassium 20 mEq 1 tablet p.o. daily.  8. Metoclopramide 5 mg p.o. q.i.d.  9. Nexium 40 mg daily.  10.Lunesta 2 mg p.o. nightly p.r.n. insomnia.  11.Vitamin B complex 100 mg daily.  12.Multivitamins Weight Smart daily.  FAMILY HISTORY:  Father deceased at age 6 secondary to assault.  He was  otherwise healthy.  Mother alive at age 93, healthy.  She has 1 brother with  end-stage renal disease that sounds like it is related to strep and may be  post-streptococcal glomerulonephritis.  No family history of cardiovascular  disease or diabetes.  Her maternal grandfather did have prostate cancer.  There is no other family history of cancer.   SOCIAL HISTORY:  She smokes less than 1 pack per day.  She does not drink  alcohol.  She does not use drugs.  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  She has been married for 15 years.  She  denies verbal or physical abuse.  She has 3 children, 1 who has problems  with prediabetes and obesity.  She walks 3 times per week and goes to the  gym 2 days per week.  She eats fruits, vegetables, and lots of water, and is  trying to improve her diet.   PHYSICAL EXAM:  VITAL SIGNS:  Height 67 inches, weight 232, making BMI 37.  Blood pressure 116/82, pulse 80, temperature 98.3.  GENERAL:  Obese-appearing female in no apparent distress.  HEENT:  PERRLA.  Extraocular muscles are intact.   Oropharynx clear.  Tympanic membranes clear.  Nares clear.  No thyromegaly.  No lymphadenopathy.  CARDIOVASCULAR:  Regular rate and rhythm.  No murmurs, rubs, or gallops.  PULMONARY:  Clear to auscultation bilaterally.  No wheeze, rales, or  rhonchi.  ABDOMEN:  Soft and nontender.  Normoactive bowel sounds.  No  hepatosplenomegaly.  SKIN:  Multiple scars over left hand, wrist, and forearm, as well as a small  scar on the left shoulder and scarring on medial elbow.  EXTREMITIES:  With 2+ pulses bilateral lower extremities, slight swelling in  left upper hand, non-pitting.  Unable to squeeze more than 3/5 on the  strength scale in the left hand secondary to swelling and pain.  Otherwise,  strength throughout is 5/5.  NEURO:  Alert and oriented x3.  Cranial nerves 2-12 are grossly intact.  PSYCH:  No suicidal or homicidal ideation.   DIAGNOSES:  1. Hypertension, well-controlled:  Continue on current medication.  2. Depression, stable:  She will continue on Cymbalta at the current dose.  3. Chronic pain:  This is very extensive given her injury to her left hand      and the multiple surgeries.  It also appears that she is having some      pain in her neck secondary to herniated disk.  She will keep her      appointment with Dr. Pamelia Hoit regarding changing her Kadian and MSIR.      She may continue on Flexeril, the dose she is currently on.  I did warn      her that she is on a lot of sedating medications and she needs to be      careful.  4. Prevention:  She has not had any Pap smear secondary to having a      hysterectomy.  She refused a flu vaccine today.  She will determine      what dose her Chantix is and, if she is not on a full dose, we can      increase this.  She is encouraged to exercise and lose weight as well      as improve her diet.  It sounds as though she has had  blood sugar      previously at her other doctor's that suggested prediabetes.  She will     very likely need  to have this rechecked, as well as to determine when a      cholesterol panel has last been done.     Kerby Nora, MD    AB/MedQ  DD: 06/24/2006  DT: 06/24/2006  Job #: [id number]

## 2010-12-29 NOTE — Assessment & Plan Note (Signed)
[facility]                                 ON-CALL NOTE   JAKHIA, BUXTON                    MRN:          [id number]  DATE:11/10/2006                            DOB:          Oct 02, 1965    Caller is Ruthine Dose, her daughter, November 10, 2006 at 6:45 p.m.  Phone number [phone]. Regular doctor is Dr. Ermalene Searing. I am Dr Milinda Antis, on  call.   CHIEF COMPLAINT:  Back pain.   Porsche said she had just brought her mother to the emergency room at  [facility]. She suddenly had very severe back pain and was unable to  move. They had much difficulty moving her from the bed to get her to the  hospital, but she is in tremendous pain. She wanted to know if the wait  would be long in the emergency room and if there was anything I could do  to speed it up. I told her that it would take a while to be seen and  that more life-threatening cases tend to be seen in the emergency room  first so they may have a long wait, to try to be as organized as she can  about her mother's medicine list and medical problems and to call back  if they had any problems, and she said they would wait in the emergency  room to be seen.     Marne A. Tower, MD  Electronically Signed    MAT/MedQ  DD: 11/11/2006  DT: 11/11/2006  Job #: [id number]   cc:   Kerby Nora, MD

## 2010-12-29 NOTE — Assessment & Plan Note (Signed)
HISTORY OF PRESENT ILLNESS:  Emma Munoz is a 45 year old married black  female who was kindly referred by Dr. Artist Pais for evaluation of left upper  extremity pain. She was seen for the first time December 10, 2005. At that  evaluation, it was noted that she had increased lower extremity tone with  sustained clonus on the right as well as persistent left upper extremity  pain. She also had brisk reflexes throughout both lower extremities. An MRI  of cervical spine was obtained, which was done with [facility]. Read  by Dr. Pecolia Ades. A moderate size disk protrusion at C5-6 with extrusion of  disk material up behind C5 and down behind C6 was noted. There is also  significant mass effect on the anterior thecal sac, which Dr. Pecolia Ades felt  may be responsible for patient's radicular symptoms. This study was reviewed  with [name] and her husband today. Attempts were made to call the  patient. A phone message was left, however, [name] states that she  believes her daughter may have inadvertently erased the tape and she was  unable to contact our office because she did not know that we had left a  message.   [name] states that her pain continues to be about a 7 on a scale of  10 in the left upper extremity, involving from the shoulder region  throughout the elbow and into the hand area. The pain is described as sharp,  burning, stabbing, dull, tingling, and aching variably. Sleep is overall  poor. Repetitive motions typically exacerbate her pain. She does note she  has intermittent neck stiffness. Has difficulty looking behind her while she  is driving. She can walk essentially indefinitely. Does note some balance  problems, however, which are mild. She is able to climb stairs. She is  independent with her self care. Denis bowel or bladder control problems.  Denis depression or anxiety. Denies suicidal ideation.   PAST MEDICAL HISTORY/SOCIAL HISTORY/FAMILY HISTORY:   Denies any changes  since our last visit.   PHYSICAL EXAMINATION:  VITAL SIGNS:  Blood pressure 149/100, pulse 84,  respiratory rate 16, 98% saturated on room air.  GENERAL:  A well developed, well nourished, black female who appears her  stated age.  NEUROLOGIC:  Oriented x3. Affect bright and alert. Cooperative and pleasant.  Transitions from sit to stand without any difficulty. Gait overall is within  normal limits. Tandem gait, she has some trouble. Romberg's test, she had  some mild trouble as well. Slight decreased range of motion  in the cervical  spine. Full shoulder range of motion  is noted. Decreased sensation noted in  the left upper extremity with light touch. Motor strength in upper and lower  extremities is overall in the 5 over 5 range. Reflexes are brisk throughout.  She has some sustained clonus in the right. Few beats on the left are noted  in the lower extremities as well.   IMPRESSION:  1.  Status post left hand crush injury in 1999.  2.  Status post left ulnar transposition 1999 and 2001.  3.  Status post left carpal tunnel surgery 1999.  4.  Status post left digital release in little finger in 1999.  5.  Left shoulder surgery rotator cuff repair 2003.  6.  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   PLAN:  With the patient's history  of mild balance problems, brisk reflexes,  and right and left upper extremity pain, and with recent MRI of cervical  spine, which showed C5-6 disk extrusion, would like her to followup with  neurosurgery for further evaluation and possible treatment. The patient does  not need a refill on her morphine at this time. However, should she need  refills, we anticipate we will be taking over her narcotic medications.           ______________________________  Brantley Stage, M.D.     DMK/MedQ  D:  01/04/2006 14:21:40  T:  01/05/2006 14:59:18   Job #:  [id number]   cc:   Thomos Lemons, D.O. [facility]  [address] [place], [place] [address]

## 2011-05-31 LAB — I-STAT 8, (EC8 V) (CONVERTED LAB)
Acid-Base Excess: 1
BUN: 8
Bicarbonate: 27.6 — ABNORMAL HIGH
Chloride: 106
Glucose, Bld: 109 — ABNORMAL HIGH
HCT: 49 — ABNORMAL HIGH
Hemoglobin: 16.7 — ABNORMAL HIGH
Operator id: 277751
Potassium: 3.7
Sodium: 140
TCO2: 29
pCO2, Ven: 47.9
pH, Ven: 7.369 — ABNORMAL HIGH

## 2011-05-31 LAB — POCT I-STAT CREATININE
Creatinine, Ser: 0.8
Operator id: 277751

## 2011-07-14 HISTORY — PX: IMPLANTATION VAGAL NERVE STIMULATOR: SUR692

## 2011-10-16 ENCOUNTER — Telehealth: Payer: Self-pay | Admitting: Internal Medicine

## 2011-10-16 ENCOUNTER — Ambulatory Visit: Payer: Self-pay | Admitting: Internal Medicine

## 2011-10-16 NOTE — Telephone Encounter (Signed)
Please put her in first available slot.  We will need all notes.

## 2011-10-16 NOTE — Telephone Encounter (Signed)
The gerald kanda pa at [facility] called back Emma Munoz is unable to keep this appointment today because of transportation issues.  The pa wanted to know when we could get her worked in.  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 We need to call kim @ [facility] with appointment [phone]

## 2011-10-17 NOTE — Telephone Encounter (Signed)
Kim @ [facility] will let pt know her appointment date and time she will also fax over the office notes to [place] Correct number is [phone]

## 2011-10-19 ENCOUNTER — Ambulatory Visit (INDEPENDENT_AMBULATORY_CARE_PROVIDER_SITE_OTHER): Payer: Medicare Other | Admitting: Internal Medicine

## 2011-10-19 ENCOUNTER — Encounter: Payer: Self-pay | Admitting: Internal Medicine

## 2011-10-19 DIAGNOSIS — I10 Essential (primary) hypertension: Secondary | ICD-10-CM

## 2011-10-19 DIAGNOSIS — E119 Type 2 diabetes mellitus without complications: Secondary | ICD-10-CM

## 2011-10-19 DIAGNOSIS — R110 Nausea: Secondary | ICD-10-CM | POA: Insufficient documentation

## 2011-10-19 MED ORDER — GLUCOSE BLOOD VI STRP: ORAL_STRIP | Status: DC

## 2011-10-19 MED ORDER — GLIPIZIDE-METFORMIN HCL 2.5-500 MG PO TABS: 1.0000 | ORAL_TABLET | Freq: Two times a day (BID) | ORAL | Status: DC

## 2011-10-19 MED ORDER — FREESTYLE LANCETS MISC: Status: DC

## 2011-10-19 NOTE — Progress Notes (Signed)
Subjective:    Patient ID: Emma Munoz, female    DOB: 05-13-1966, 46 y.o.   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 She notes that over the last several years she has not been feeling well. She notes she has had numerous issues with her bowels including intermittent diarrhea and constipation. She reports extensive workup in the past for this. Etiology has not been determined. Last week, she was feeling particularly poorly in question if she might have a urinary tract infection. She went to urgent care and was found to have blood sugar greater than 500. Her hemoglobin A1c was 12%. She was started on metformin and scheduled in our office to establish care. She reports that she's been taking the metformin and denies any side effects noted from this medication. She was not given a glucometer but checked her blood sugar using her husband's monitor and found her sugar this morning to be 245. She questions how she could develop diabetes so quickly given that she had lab work less than 3 months ago which was completely normal. She questions whether there may be some secondary cause to her diabetes. She reports no family history of diabetes.  Outpatient Encounter Prescriptions as of 10/19/2011  Medication Sig Dispense Refill  . cyclobenzaprine (FLEXERIL) 10 MG tablet Take 20 mg by mouth at bedtime.      Marland Kitchen esomeprazole (NEXIUM) 40 MG capsule Take 40 mg by mouth daily.      . furosemide (LASIX) 40 MG tablet Take 40 mg by mouth daily. For elevated BP      . metoprolol-hydrochlorothiazide (LOPRESSOR HCT) 100-25 MG per tablet Take 1 tablet by mouth daily.      Marland Kitchen morphine (KADIAN) 80 MG 24 hr capsule Take 80 mg by mouth 2 (two) times daily.      Marland Kitchen morphine (MSIR) 30 MG tablet Take 30 mg by mouth as needed.      . Multiple Vitamins-Minerals (MULTIVITAMIN WITH MINERALS) tablet  Take 1 tablet by mouth daily.      . Vitamin D, Ergocalciferol, (DRISDOL) 50000 UNITS CAPS Take 50,000 Units by mouth 2 (two) times a week.      Marland Kitchen glipiZIDE-metformin (METAGLIP) 2.5-500 MG per tablet Take 1 tablet by mouth 2 (two) times daily before a meal.  60 tablet  3  . glucose blood (FREESTYLE LITE) test strip Test twice daily DX 250.02  100 each  3  . Lancets (FREESTYLE) lancets Use as instructed Twice daily DX: 250.02  100 each  12    Review of Systems  Constitutional: Negative for fever, chills, appetite change, fatigue and unexpected weight change.  HENT: Negative for ear pain, congestion, sore throat, trouble swallowing, neck pain, voice change and sinus pressure.   Eyes: Negative for visual disturbance.  Respiratory: Negative for cough, shortness of breath, wheezing and stridor.   Cardiovascular: Negative for chest pain, palpitations and leg swelling.  Gastrointestinal: Positive for nausea, abdominal pain, diarrhea, constipation and abdominal distention. Negative for vomiting, blood in stool and anal bleeding.  Genitourinary: Negative for dysuria and flank pain.  Musculoskeletal: Positive for myalgias, back pain and arthralgias. Negative for gait problem.  Skin: Negative for color change and rash.  Neurological: Negative for dizziness and headaches.  Hematological: Negative for adenopathy. Does not bruise/bleed easily.  Psychiatric/Behavioral: Negative for suicidal ideas, sleep disturbance and dysphoric mood. The patient is not nervous/anxious.  BP 140/90  Pulse 78  Temp(Src) 98.2 F (36.8 C) (Oral)  Ht 5\' 7"  (1.702 m)  Wt 225 lb (102.059 kg)  BMI 35.24 kg/m2  SpO2 98%     Objective:   Physical Exam  Constitutional: She is oriented to person, place, and time. She appears well-developed and well-nourished. No distress.  HENT:  Head: Normocephalic and atraumatic.  Right Ear: External ear normal.  Left Ear: External ear normal.  Nose: Nose normal.  Mouth/Throat:  Oropharynx is clear and moist. No oropharyngeal exudate.  Eyes: Conjunctivae are normal. Pupils are equal, round, and reactive to light. Right eye exhibits no discharge. Left eye exhibits no discharge. No scleral icterus.  Neck: Normal range of motion. Neck supple. No tracheal deviation present. No thyromegaly present.  Cardiovascular: Normal rate, regular rhythm, normal heart sounds and intact distal pulses.  Exam reveals no gallop and no friction rub.   No murmur heard. Pulmonary/Chest: Effort normal and breath sounds normal. No respiratory distress. She has no wheezes. She has no rales. She exhibits no tenderness.  Musculoskeletal: Normal range of motion. She exhibits no edema and no tenderness.  Lymphadenopathy:    She has no cervical adenopathy.  Neurological: She is alert and oriented to person, place, and time. No cranial nerve deficit. She exhibits normal muscle tone. Coordination normal.  Skin: Skin is warm and dry. No rash noted. She is not diaphoretic. No erythema. No pallor.  Psychiatric: She has a normal mood and affect. Her behavior is normal. Judgment and thought content normal.          Assessment & Plan:

## 2011-10-19 NOTE — Assessment & Plan Note (Signed)
As above, question if she may of had chronic pancreatitis. Will check lipase with labs. Will get abdominal ultrasound. Followup 2 weeks.

## 2011-10-19 NOTE — Assessment & Plan Note (Signed)
Blood pressure well-controlled today. We'll continue current medications. We'll check renal function with labs on Monday. Will consider changing to ACE inhibitor at next visit. Followup in 2 weeks.

## 2011-10-19 NOTE — Assessment & Plan Note (Addendum)
New diagnosis last week. Given sudden onset and symptoms of progressive nausea over months, question if she may of had chronic pancreatitis. Given that sugars are persistently elevated, will change to glipizide metformin. We discussed the use of insulin but she prefers not to start injectable medication at this time. She was given glucometer today and instructed on use. She will record her blood sugars twice daily and email with results. Goal fasting sugar will be 80-120. She will call if any blood sugars less than 80. We will check CMP, TSH, and lipase with her lab work fasting on Monday. She will have abdominal ultrasound to look at her pancreas. She will be referred to nutritionist for counseling. She will followup in 2 weeks.

## 2011-10-19 NOTE — Patient Instructions (Signed)
Check blood sugars twice daily.  Email results.  Follow up 2 weeks.

## 2011-10-22 ENCOUNTER — Encounter: Payer: Self-pay | Admitting: Internal Medicine

## 2011-10-22 ENCOUNTER — Other Ambulatory Visit (INDEPENDENT_AMBULATORY_CARE_PROVIDER_SITE_OTHER): Payer: Medicare Other | Admitting: *Deleted

## 2011-10-22 DIAGNOSIS — D649 Anemia, unspecified: Secondary | ICD-10-CM

## 2011-10-22 DIAGNOSIS — R110 Nausea: Secondary | ICD-10-CM

## 2011-10-22 DIAGNOSIS — E119 Type 2 diabetes mellitus without complications: Secondary | ICD-10-CM

## 2011-10-22 DIAGNOSIS — F329 Major depressive disorder, single episode, unspecified: Secondary | ICD-10-CM

## 2011-10-22 LAB — COMPREHENSIVE METABOLIC PANEL
ALT: 177 U/L — ABNORMAL HIGH (ref 0–35)
AST: 188 U/L — ABNORMAL HIGH (ref 0–37)
Albumin: 4 g/dL (ref 3.5–5.2)
Alkaline Phosphatase: 115 U/L (ref 39–117)
BUN: 9 mg/dL (ref 6–23)
CO2: 27 mEq/L (ref 19–32)
Calcium: 11 mg/dL — ABNORMAL HIGH (ref 8.4–10.5)
Chloride: 100 mEq/L (ref 96–112)
Creatinine, Ser: 0.8 mg/dL (ref 0.4–1.2)
GFR: 101.63 mL/min (ref >60.00)
Glucose, Bld: 207 mg/dL — ABNORMAL HIGH (ref 70–99)
Potassium: 3.7 mEq/L (ref 3.5–5.1)
Sodium: 135 mEq/L (ref 135–145)
Total Bilirubin: 0.5 mg/dL (ref 0.3–1.2)
Total Protein: 7.3 g/dL (ref 6.0–8.3)

## 2011-10-22 LAB — LIPID PANEL
Cholesterol: 214 mg/dL — ABNORMAL HIGH (ref 0–200)
HDL: 45.1 mg/dL (ref >39.00)
Total CHOL/HDL Ratio: 5
Triglycerides: 282 mg/dL — ABNORMAL HIGH (ref 0.0–149.0)
VLDL: 56.4 mg/dL — ABNORMAL HIGH (ref 0.0–40.0)

## 2011-10-22 LAB — LDL CHOLESTEROL, DIRECT: Direct LDL: 109.1 mg/dL

## 2011-10-22 LAB — TSH: TSH: 0.73 u[IU]/mL (ref 0.35–5.50)

## 2011-10-23 ENCOUNTER — Encounter: Payer: Self-pay | Admitting: Internal Medicine

## 2011-10-23 ENCOUNTER — Other Ambulatory Visit (INDEPENDENT_AMBULATORY_CARE_PROVIDER_SITE_OTHER): Payer: Medicare Other | Admitting: *Deleted

## 2011-10-23 ENCOUNTER — Other Ambulatory Visit: Payer: Self-pay | Admitting: Internal Medicine

## 2011-10-23 DIAGNOSIS — K7200 Acute and subacute hepatic failure without coma: Secondary | ICD-10-CM

## 2011-10-23 DIAGNOSIS — E039 Hypothyroidism, unspecified: Secondary | ICD-10-CM

## 2011-10-23 DIAGNOSIS — K759 Inflammatory liver disease, unspecified: Secondary | ICD-10-CM

## 2011-10-23 NOTE — Progress Notes (Signed)
Addended by: Jobie Quaker on: 10/23/2011 04:31 PM   Modules accepted: Orders

## 2011-10-24 ENCOUNTER — Other Ambulatory Visit: Payer: Self-pay | Admitting: *Deleted

## 2011-10-24 LAB — HEPATITIS B SURFACE ANTIBODY,QUALITATIVE: Hep B S Ab: NEGATIVE

## 2011-10-24 LAB — LIPASE: Lipase: 14 U/L (ref 0–75)

## 2011-10-24 LAB — HEPATITIS B CORE ANTIBODY, TOTAL: Hep B Core Total Ab: NEGATIVE

## 2011-10-24 LAB — HEPATITIS C ANTIBODY: HCV Ab: NEGATIVE

## 2011-10-24 LAB — COMPREHENSIVE METABOLIC PANEL
Albumin: 4.6 g/dL (ref 3.5–5.2)
Alkaline Phosphatase: 140 U/L — ABNORMAL HIGH (ref 39–117)
BUN: 10 mg/dL (ref 6–23)
Glucose, Bld: 144 mg/dL — ABNORMAL HIGH (ref 70–99)
Potassium: 4.2 mEq/L (ref 3.5–5.3)
Total Bilirubin: 0.6 mg/dL (ref 0.3–1.2)

## 2011-10-24 LAB — PROTIME-INR
INR: 0.93 (ref <1.50)
Prothrombin Time: 12.9 seconds (ref 11.6–15.2)

## 2011-10-24 LAB — TSH: TSH: 2.449 u[IU]/mL (ref 0.350–4.500)

## 2011-10-24 LAB — HEPATITIS A ANTIBODY, IGM: Hep A IgM: NEGATIVE

## 2011-10-24 MED ORDER — METOPROLOL-HYDROCHLOROTHIAZIDE 100-25 MG PO TABS: 1.0000 | ORAL_TABLET | Freq: Every day | ORAL | Status: DC

## 2011-10-25 ENCOUNTER — Encounter: Payer: Self-pay | Admitting: Internal Medicine

## 2011-10-25 LAB — CMV IGM: CMV IgM: 0.38 (ref <0.90)

## 2011-10-25 LAB — ANTI-MICROSOMAL ANTIBODY LIVER / KIDNEY: LKM1 Ab: <=20 U (ref <=20.0)

## 2011-10-26 ENCOUNTER — Telehealth: Payer: Self-pay | Admitting: Internal Medicine

## 2011-10-26 ENCOUNTER — Telehealth: Payer: Self-pay | Admitting: *Deleted

## 2011-10-26 DIAGNOSIS — R110 Nausea: Secondary | ICD-10-CM

## 2011-10-26 DIAGNOSIS — R748 Abnormal levels of other serum enzymes: Secondary | ICD-10-CM

## 2011-10-26 LAB — IGG, IGA, IGM: IgG (Immunoglobin G), Serum: 1200 mg/dL (ref 690–1700)

## 2011-10-26 LAB — CERULOPLASMIN: Ceruloplasmin: 40 mg/dL (ref 20–60)

## 2011-10-26 NOTE — Telephone Encounter (Signed)
Referral entered  

## 2011-10-26 NOTE — Telephone Encounter (Signed)
Message copied by Vernie Murders on Fri Oct 26, 2011  2:50 PM ------      Message from: Ronna Polio A      Created: Fri Oct 26, 2011  2:10 PM       Please bring her back for [facility].

## 2011-10-29 ENCOUNTER — Encounter: Payer: Self-pay | Admitting: Internal Medicine

## 2011-10-29 ENCOUNTER — Other Ambulatory Visit (INDEPENDENT_AMBULATORY_CARE_PROVIDER_SITE_OTHER): Payer: Medicare Other | Admitting: *Deleted

## 2011-10-29 ENCOUNTER — Ambulatory Visit: Payer: Self-pay | Admitting: Internal Medicine

## 2011-10-29 DIAGNOSIS — D649 Anemia, unspecified: Secondary | ICD-10-CM

## 2011-10-29 DIAGNOSIS — G4700 Insomnia, unspecified: Secondary | ICD-10-CM

## 2011-10-29 DIAGNOSIS — R110 Nausea: Secondary | ICD-10-CM

## 2011-10-29 DIAGNOSIS — F329 Major depressive disorder, single episode, unspecified: Secondary | ICD-10-CM

## 2011-10-30 ENCOUNTER — Telehealth: Payer: Self-pay | Admitting: Internal Medicine

## 2011-10-30 DIAGNOSIS — K8020 Calculus of gallbladder without cholecystitis without obstruction: Secondary | ICD-10-CM

## 2011-10-30 LAB — PTH, INTACT AND CALCIUM: Calcium, Total (PTH): 9.9 mg/dL (ref 8.4–10.5)

## 2011-10-30 NOTE — Telephone Encounter (Signed)
US abdomen was abnormal with multiple stones in the gallbladder and dilation of the common bile duct.  I would like to set her up with general surgery for evaluation for cholecystectomy.

## 2011-10-30 NOTE — Telephone Encounter (Signed)
Patient informed. 

## 2011-10-31 NOTE — Telephone Encounter (Signed)
Okay to schedule, new pt slot I do not see Ganem's notes in EP[facility]. They need to be available for review prior to her appt. Thanks

## 2011-11-01 ENCOUNTER — Telehealth: Payer: Self-pay | Admitting: Internal Medicine

## 2011-11-01 ENCOUNTER — Ambulatory Visit (INDEPENDENT_AMBULATORY_CARE_PROVIDER_SITE_OTHER): Payer: Medicare Other | Admitting: Internal Medicine

## 2011-11-01 ENCOUNTER — Encounter: Payer: Self-pay | Admitting: Internal Medicine

## 2011-11-01 VITALS — BP 158/86 | HR 110 | Temp 98.5°F | Wt 226.0 lb

## 2011-11-01 DIAGNOSIS — E1165 Type 2 diabetes mellitus with hyperglycemia: Secondary | ICD-10-CM

## 2011-11-01 DIAGNOSIS — K8020 Calculus of gallbladder without cholecystitis without obstruction: Secondary | ICD-10-CM | POA: Insufficient documentation

## 2011-11-01 DIAGNOSIS — E210 Primary hyperparathyroidism: Secondary | ICD-10-CM

## 2011-11-01 NOTE — Telephone Encounter (Signed)
Needs email about appointments.

## 2011-11-01 NOTE — Progress Notes (Signed)
Subjective:    Patient ID: Emma Munoz, female    DOB: 14-Dec-1965, 46 y.o.   MRN: [id number]  HPI 46 year old female with history of diabetes, depression, chronic pain, and intermittent episodes of nausea and abdominal pain presents for followup. She presents to discuss results of recent testing. Recent ultrasound of the abdomen showed gallstones but no evidence of cholecystitis. We discussed the potential of intermittent episodes of cholecystitis. She notes that over the last several years she has had intermittent episodes of nausea after eating fatty foods. This is associated with diffuse abdominal pain. This had not been evaluated in the past with ultrasound.  We also reviewed recent lab testing which showed elevated parathyroid hormone level and elevated calcium level. We discussed primary hyperparathyroidism. We discussed the potential symptoms associated with this disorder. Patient notes that for years, she has had fatigue, depression, and chronic bony pain. She notes that in the distant past her OB/GYN had noted her calcium and parathyroid hormone to be elevated, but no action was taken at that time.  In regards to her diabetes, she notes that blood sugars recently have been near 150-200. She reports full compliance with her medications.  Outpatient Encounter Prescriptions as of 11/01/2011  Medication Sig Dispense Refill  . cyclobenzaprine (FLEXERIL) 10 MG tablet Take 20 mg by mouth at bedtime.      Marland Kitchen esomeprazole (NEXIUM) 40 MG capsule Take 40 mg by mouth daily.      . furosemide (LASIX) 40 MG tablet Take 40 mg by mouth daily. For elevated BP      . glipiZIDE-metformin (METAGLIP) 2.5-500 MG per tablet Take 1 tablet by mouth 2 (two) times daily before a meal.  60 tablet  3  . glucose blood (FREESTYLE LITE) test strip Test twice daily DX 250.02  100 each  3  . Lancets (FREESTYLE) lancets Use as instructed Twice daily DX: 250.02  100 each  12  . metoprolol-hydrochlorothiazide (LOPRESSOR  HCT) 100-25 MG per tablet Take 1 tablet by mouth daily.  90 tablet  1  . morphine (KADIAN) 80 MG 24 hr capsule Take 80 mg by mouth 2 (two) times daily.      Marland Kitchen morphine (MSIR) 30 MG tablet Take 30 mg by mouth as needed.      . Multiple Vitamins-Minerals (MULTIVITAMIN WITH MINERALS) tablet Take 1 tablet by mouth daily.      . Vitamin D, Ergocalciferol, (DRISDOL) 50000 UNITS CAPS Take 50,000 Units by mouth 2 (two) times a week.        Review of Systems  Constitutional: Positive for fatigue. Negative for fever, chills, appetite change and unexpected weight change.  HENT: Negative for ear pain, congestion, sore throat, trouble swallowing, neck pain, voice change and sinus pressure.   Eyes: Negative for visual disturbance.  Respiratory: Negative for cough, shortness of breath, wheezing and stridor.   Cardiovascular: Negative for chest pain, palpitations and leg swelling.  Gastrointestinal: Positive for nausea, abdominal pain and constipation. Negative for vomiting, diarrhea, blood in stool, abdominal distention and anal bleeding.  Genitourinary: Negative for dysuria and flank pain.  Musculoskeletal: Positive for myalgias, back pain and arthralgias. Negative for gait problem.  Skin: Negative for color change and rash.  Neurological: Negative for dizziness and headaches.  Hematological: Negative for adenopathy. Does not bruise/bleed easily.  Psychiatric/Behavioral: Negative for suicidal ideas, sleep disturbance and dysphoric mood. The patient is not nervous/anxious.    BP 158/86  Pulse 110  Temp(Src) 98.5 F (36.9 C) (Oral)  Wt 226 lb (  102.513 kg)  SpO2 98%     Objective:   Physical Exam  Constitutional: She is oriented to person, place, and time. She appears well-developed and well-nourished. No distress.  HENT:  Head: Normocephalic and atraumatic.  Right Ear: External ear normal.  Left Ear: External ear normal.  Nose: Nose normal.  Mouth/Throat: Oropharynx is clear and moist. No  oropharyngeal exudate.  Eyes: Conjunctivae are normal. Pupils are equal, round, and reactive to light. Right eye exhibits no discharge. Left eye exhibits no discharge. No scleral icterus.  Neck: Normal range of motion. Neck supple. Carotid bruit is not present. No tracheal deviation present. No mass and no thyromegaly present.    Cardiovascular: Normal rate, regular rhythm, normal heart sounds and intact distal pulses.  Exam reveals no gallop and no friction rub.   No murmur heard. Pulmonary/Chest: Effort normal and breath sounds normal. No respiratory distress. She has no wheezes. She has no rales. She exhibits no tenderness.  Musculoskeletal: Normal range of motion. She exhibits no edema and no tenderness.  Lymphadenopathy:    She has no cervical adenopathy.  Neurological: She is alert and oriented to person, place, and time. No cranial nerve deficit. She exhibits normal muscle tone. Coordination normal.  Skin: Skin is warm and dry. No rash noted. She is not diaphoretic. No erythema. No pallor.  Psychiatric: She has a normal mood and affect. Her behavior is normal. Judgment and thought content normal.          Assessment & Plan:

## 2011-11-01 NOTE — Assessment & Plan Note (Signed)
Noted on ultrasound of abdomen. Symptoms are most consistent with recurrent episodes of cholecystitis. Will set up with general surgery for evaluation for cholecystectomy.

## 2011-11-01 NOTE — Patient Instructions (Addendum)
Surgery Appt Dr. Lemar Livings, 3/28 at 2pm  Parathyroid Hormone This is a test to determine whether PTH levels are responding normally to changes in blood calcium levels. It also helps to distinguish the cause of calcium imbalances, and to evaluate parathyroid function. When calcium blood levels are higher or lower than normal, and when your caregiver may want to determine how well your parathyroid glands are working. Parathyroid hormone (PTH) helps the body maintain stable levels of calcium in the blood. It is part of a 'feedback loop' that includes calcium, PTH, vitamin D, and, to some extent, phosphate and magnesium. Conditions and diseases that disrupt this feedback loop can cause inappropriate elevations or decreases in calcium and PTH levels and lead to symptoms of hypercalcemia (raised blood levels of calcium) or hypocalcemia (low blood levels of calcium).  PTH is produced by four parathyroid glands that are located in the neck beside the thyroid gland. Normally, these glands secrete PTH into the bloodstream in response to low blood calcium levels. Parathyroid hormone then works in three ways to help raise blood calcium levels back to normal. It takes calcium from the body's bone, stimulates the activation of vitamin D in the kidney (which in turn increases the absorption of calcium from the intestines), and suppresses the excretion of calcium in the urine (while encouraging excretion of phosphate). As calcium levels begin to increase in the blood, PTH normally decreases. PREPARATION FOR TEST You should have nothing to eat or drink except for water after midnight on the day of the test or as directed by your caregiver. A blood sample is obtained by inserting a needle into a vein in the arm. NORMAL FINDINGS Conventional Normal  PTH intact (whole)   Assay includes intact PTH   Values (pg/mL)10-65   SI Units (ng/L)10-65   PTH N-terminalN-terminal   Values (pg/mL) 8-24   SI Units (ng/L)8-24     PTH C-terminal   Assay Includes C-terminal   Values (pg/mL) 50-330   SI Units (ng/L) 50-330   Intact PTH   Midmolecule  Ranges for normal findings may vary among different laboratories and hospitals. You should always check with your doctor after having lab work or other tests done to discuss the meaning of your test results and whether your values are considered within normal limits. MEANING OF TEST  Your caregiver will go over the test results with you and discuss the importance and meaning of your results, as well as treatment options and the need for additional tests if necessary. OBTAINING THE TEST RESULTS  It is your responsibility to obtain your test results. Ask the lab or department performing the test when and how you will get your results. Document Released: 09/01/2004 Document Revised: 07/19/2011 Document Reviewed: 07/11/2008 [facility] Patient Information 2012 [place], [place].

## 2011-11-01 NOTE — Assessment & Plan Note (Signed)
Recent blood sugars continue to be elevated just above goal. We'll continue current medications for now and await evaluation of other ongoing issues. Patient will followup in one month.

## 2011-11-01 NOTE — Assessment & Plan Note (Signed)
Patient was noted to have elevated calcium on labs. Parathyroid hormone was also elevated at greater than 100. This is consistent with primary hyperparathyroidism. Discussed at length with patient today. Will set up referral to endocrinology. Given that this has been persistent for several years, question if it may have contributed to many of her symptoms. We also discussed that she may have bone loss and will need bone density testing. She will followup here in one month after seen by endocrinology.

## 2011-11-02 ENCOUNTER — Encounter: Payer: Self-pay | Admitting: Internal Medicine

## 2011-11-02 ENCOUNTER — Ambulatory Visit: Payer: Medicare Other | Admitting: Internal Medicine

## 2011-11-03 ENCOUNTER — Encounter: Payer: Self-pay | Admitting: Internal Medicine

## 2011-11-05 ENCOUNTER — Telehealth: Payer: Self-pay | Admitting: Internal Medicine

## 2011-11-05 ENCOUNTER — Ambulatory Visit: Payer: Medicare Other | Admitting: Internal Medicine

## 2011-11-05 ENCOUNTER — Encounter: Payer: Self-pay | Admitting: Internal Medicine

## 2011-11-05 NOTE — Telephone Encounter (Signed)
[facility] Triage Call Report Triage Record Num: [id number] Operator: Lesli Albee Patient Name: Emma Munoz Call Date & Time: 11/04/2011 8:31:27PM Patient Phone: [phone] PCP: Patient Gender: Female PCP Fax : Patient DOB: November 18, 1965 Practice Name: [facility] Reason for Call: Caller: [name]/Patient; PCP: Ronna Polio; CB#: [phone]; Call regarding Vomiting, shaking, abd. pain; Pt is shaky; abdominal pain and vomiting. Patient has gallstones. Pt has consultations this week for a parathyroid and gallbladder . Pt states she was supposed to call her MD if these sx occurred. Pt has been vomiting x 1 hour this time. She does not want to go to the hospital. Rn read note in EPIC concerning gallstones. Advised pt monitor for several hours if no improvement go to the hospital Protocol(s) Used: Nausea or Vomiting Recommended Outcome per Protocol: See Provider within 2 Weeks Reason for Outcome: Frequent episodes of indigestion/reflux Care Advice: Avoid the use of chocolate, peppermint, coffee, alcohol, and tobacco. These are irritants that may increase the reflux symptoms. ~ ~ SYMPTOM / CONDITION MANAGEMENT Heartburn Relief (Dietary): - Avoid overeating. - Eat smaller, more frequent meals and chew each bite thoroughly. - Avoid high fat, spicy or gas-producing foods. - Limit liquids/foods that are gastric irritants (fruit juices, caffeinated, carbonated or alcoholic beverages, chocolate and dairy products). - Eat at least three hours before bedtime. ~ Heartburn Relief (Positioning): - Avoid bending over at the waist or lying flat soon after eating. - Avoid clothing that is tight around the abdomen and waist. - Sleeping on stacked pillows or raising the head of bed on 6 inch blocks may help prevent reflux. ~ 11/04/2011 8:50:05PM Page 1 of 1 [id number]

## 2011-11-06 ENCOUNTER — Encounter: Payer: Self-pay | Admitting: Internal Medicine

## 2011-11-12 ENCOUNTER — Encounter: Payer: Self-pay | Admitting: Internal Medicine

## 2011-11-12 HISTORY — PX: CHOLECYSTECTOMY: SHX55

## 2011-11-13 ENCOUNTER — Ambulatory Visit: Payer: Self-pay | Admitting: General Surgery

## 2011-11-13 NOTE — Telephone Encounter (Signed)
Patient is scheduled with Dr. Rhea Belton

## 2011-11-13 NOTE — Telephone Encounter (Signed)
Patient emailed with appointment date and time.

## 2011-11-14 ENCOUNTER — Telehealth: Payer: Self-pay | Admitting: Internal Medicine

## 2011-11-14 NOTE — Telephone Encounter (Signed)
Cordelia Pen called in and states patient is suppose to have surgery on the 8th of April with Dr. Doristine Counter.  Cordelia Pen states that she had an abnormal EKG they are faxing over and they need surgical clearance. They would like for [place] to contact patient if she needs to be seen by [place].

## 2011-11-16 ENCOUNTER — Ambulatory Visit (INDEPENDENT_AMBULATORY_CARE_PROVIDER_SITE_OTHER): Payer: Medicare Other | Admitting: Cardiovascular Disease

## 2011-11-16 ENCOUNTER — Encounter: Payer: Self-pay | Admitting: Internal Medicine

## 2011-11-16 ENCOUNTER — Telehealth: Payer: Self-pay | Admitting: Cardiovascular Disease

## 2011-11-16 ENCOUNTER — Encounter: Payer: Self-pay | Admitting: Cardiovascular Disease

## 2011-11-16 ENCOUNTER — Telehealth: Payer: Self-pay | Admitting: Internal Medicine

## 2011-11-16 ENCOUNTER — Encounter: Payer: Self-pay | Admitting: *Deleted

## 2011-11-16 VITALS — BP 158/114 | HR 101 | Wt 223.0 lb

## 2011-11-16 DIAGNOSIS — Z01810 Encounter for preprocedural cardiovascular examination: Secondary | ICD-10-CM

## 2011-11-16 DIAGNOSIS — I10 Essential (primary) hypertension: Secondary | ICD-10-CM | POA: Insufficient documentation

## 2011-11-16 DIAGNOSIS — R9431 Abnormal electrocardiogram [ECG] [EKG]: Secondary | ICD-10-CM

## 2011-11-16 DIAGNOSIS — R079 Chest pain, unspecified: Secondary | ICD-10-CM

## 2011-11-16 NOTE — Telephone Encounter (Signed)
I called and spoke with patient to give her the appointment with Dr. Kirke Corin the cardiologist she wasn't aware that she had an abnormal EKG. I advised her that this was part of the surgical clearance.  She is going to see them today.

## 2011-11-16 NOTE — Telephone Encounter (Signed)
Cardiac clearance note faxed to [phone].

## 2011-11-16 NOTE — Assessment & Plan Note (Signed)
I reviewed her recent ECG. An isolated Q wave in lead 3 is not diagnostic for inferior MI. There is a tiny Q wave in aVF which is not pathologic. There is poor R wave progression in the anterior leads but this is not a specific finding for anterior MI. Based on the patient's history, admitting she had a prior myocardial infarction. She is able to do her physical activities without any reported exertional symptoms. Her functional capacity she seems to be good. Based on all of that, I think she can proceed with the planned surgery at an overall low risk for cardiovascular complications. Her blood pressure is elevated today but that seems to be related to discomfort in her left arm. Her dose of morphine was recently decreased and she is having discomfort at this time. I do recommend at some point evaluating her with a stress test which can be done actually after she recovered from that surgery just to clarify the ECG changes. She is not able to be steady on her feet on the treadmill due to her left hand injury and she will need a pharmacologic stress test.

## 2011-11-16 NOTE — Telephone Encounter (Signed)
Needs letter for surgical clearance on Monday Fax [phone]

## 2011-11-16 NOTE — Telephone Encounter (Signed)
I called and spoke with Diane at [phone] and she will refax the EKG now.

## 2011-11-16 NOTE — Telephone Encounter (Signed)
I have not see the EKG. We can have her repeat one here?

## 2011-11-16 NOTE — Telephone Encounter (Signed)
Thank you so much for your quick work on this! The abnormal EKG was not performed by [place], it was done by the surgeon and he called today about it.

## 2011-11-16 NOTE — Telephone Encounter (Signed)
Left VM for Marcelino Duster at surgeon's office b/c pt is scheduled for surgery Monday. I advised her to call cardiology for further info regarding clearance. Marcelino Duster at surgeons office # [phone]

## 2011-11-16 NOTE — Telephone Encounter (Signed)
They need to know if she is cleared for surgery she is scheduled for this Monday 11/19/11.  Eunice Blase would like [place] to call her today and let her know [phone].

## 2011-11-16 NOTE — Progress Notes (Signed)
HPI  This is a 46 year old female who is here today for preoperative cardiovascular evaluation for cholecystectomy to be done on Monday. She has history of type 2 diabetes which was only diagnosed 3 weeks ago. She also has history of hypertension and tobacco use. She reports previous left hand injury with significant discomfort and pain. She is on long acting morphine. She was recently found to have gallstones with some abdominal discomfort. She was referred for cholecystectomy. She had a preoperative ECG which showed sinus rhythm with a Q wave in lead 3 and poor R-wave progression in the anterior leads. The patient reports no previous history of myocardial infarction. She is not aware of any cardiac problems at all. She denies any chest pain or dyspnea whatsoever even with physical activities. She walks everyday about 1 mile with her dog. Most of the time she starts at a slow pace but she goes at a faster pace later and she is able to do that without any reported exertional symptoms.  Allergies  Allergen Reactions  . Aspirin     REACTION: Tongue, throat, extremities swell  . Ibuprofen     REACTION: Tongue, throat, extremities swell  . Iohexol      Desc: N&V after Gadolinium (Magnevist) injection, Onset Date: [id number]   . Nsaids     REACTION: Tongue, throat, extremities swell     Current Outpatient Prescriptions on File Prior to Visit  Medication Sig Dispense Refill  . cyclobenzaprine (FLEXERIL) 10 MG tablet Take 20 mg by mouth at bedtime.      Marland Kitchen esomeprazole (NEXIUM) 40 MG capsule Take 40 mg by mouth daily.      Marland Kitchen glipiZIDE-metformin (METAGLIP) 2.5-500 MG per tablet Take 1 tablet by mouth 2 (two) times daily before a meal.  60 tablet  3  . glucose blood (FREESTYLE LITE) test strip Test twice daily DX 250.02  100 each  3  . Lancets (FREESTYLE) lancets Use as instructed Twice daily DX: 250.02  100 each  12  . lisinopril (PRINIVIL,ZESTRIL) 20 MG tablet Take 20 mg by mouth daily.      .  metoprolol succinate (TOPROL-XL) 100 MG 24 hr tablet Take 100 mg by mouth daily. Take with or immediately following a meal.      . morphine (KADIAN) 80 MG 24 hr capsule Take 80 mg by mouth 2 (two) times daily.      Marland Kitchen morphine (MSIR) 30 MG tablet Take 30 mg by mouth as needed.      . Multiple Vitamins-Minerals (MULTIVITAMIN WITH MINERALS) tablet Take 1 tablet by mouth daily.      . Vitamin D, Ergocalciferol, (DRISDOL) 50000 UNITS CAPS Take 50,000 Units by mouth 2 (two) times a week.         Past Medical History  Diagnosis Date  . Diabetes mellitus   . Hypertension   . Tobacco abuse      Past Surgical History  Procedure Date  . Laparoscopic hysterectomy 2005  . Cesarean section 87, 87, 95  . Lumbar disc surgery     x2  . Knee surgery     Realignment surgery  . Hand surgery   . Ulnar nerve repair   . Rotator cuff repair   . Nerve stimulator in lumbar spine      Family History  Problem Relation Age of Onset  . Early death Father 59    GUNSHOT  . Kidney disease Brother     s/p transplant  . Heart disease Maternal  Grandmother     Heart defect  . Cancer Maternal Grandfather     Prostate     History   Social History  . Marital Status: Married    Spouse Name: N/A    Number of Children: 3  . Years of Education: N/A   Occupational History  . Not on file.   Social History Main Topics  . Smoking status: Current Everyday Smoker -- 1.0 packs/day  . Smokeless tobacco: Never Used  . Alcohol Use: Yes     Rarely  . Drug Use: No  . Sexually Active: Not on file   Other Topics Concern  . Not on file   Social History Narrative   Lives with husband and 17YO daughter. 2 dogs. Disabled after left arm injury.     ROS Constitutional: Negative for fever, chills, diaphoresis, activity change, appetite change and fatigue.  HENT: Negative for hearing loss, nosebleeds, congestion, sore throat, facial swelling, drooling, trouble swallowing, neck pain, voice change, sinus  pressure and tinnitus.  Eyes: Negative for photophobia, pain, discharge and visual disturbance.  Respiratory: Negative for apnea, cough, chest tightness, shortness of breath and wheezing.  Cardiovascular: Negative for chest pain, palpitations and leg swelling.  Gastrointestinal: Negative for nausea, vomiting, abdominal pain, diarrhea, constipation, blood in stool and abdominal distention.  Genitourinary: Negative for dysuria, urgency, frequency, hematuria and decreased urine volume.  Musculoskeletal: Negative for myalgias, back pain, joint swelling, arthralgias and gait problem.  Skin: Negative for color change, pallor, rash and wound.  Neurological: Negative for dizziness, tremors, seizures, syncope, speech difficulty, weakness, light-headedness, numbness and headaches.  Psychiatric/Behavioral: Negative for suicidal ideas, hallucinations, behavioral problems and agitation. The patient is not nervous/anxious.     PHYSICAL EXAM   BP 158/114  Pulse 101  Wt 223 lb (101.152 kg)  Constitutional: She is oriented to person, place, and time. She appears well-developed and well-nourished. No distress.  HENT: No nasal discharge.  Head: Normocephalic and atraumatic.  Eyes: Pupils are equal and round. Right eye exhibits no discharge. Left eye exhibits no discharge.  Neck: Normal range of motion. Neck supple. No JVD present. No thyromegaly present.  Cardiovascular: Normal rate, regular rhythm, normal heart sounds. Exam reveals no gallop and no friction rub. No murmur heard.  Pulmonary/Chest: Effort normal and breath sounds normal. No stridor. No respiratory distress. She has no wheezes. She has no rales. She exhibits no tenderness.  Abdominal: Soft. Bowel sounds are normal. She exhibits no distension. There is no tenderness. There is no rebound and no guarding.  Musculoskeletal: Normal range of motion. She exhibits no edema and no tenderness.  Neurological: She is alert and oriented to person, place,  and time. Coordination normal.  Skin: Skin is warm and dry. No rash noted. She is not diaphoretic. No erythema. No pallor.  Psychiatric: She has a normal mood and affect. Her behavior is normal. Judgment and thought content normal.     EKG: Normal sinus rhythm isolated Q wave in III. There is a tiny Q wave in aVF which does not appear to be pathologic. There is poor R wave progression in the anterior leads.   ASSESSMENT AND PLAN

## 2011-11-16 NOTE — Patient Instructions (Signed)
Your physician recommends that you schedule a follow-up appointment in: 1 month after you have stress test @ [facility]. I will call you with the date and time of there stress test  Your physician has requested that you have a lexiscan myoview THIS WILL BE DONE @ [facility], I WILL CALL YOU WITH THE DATE AND TIME OF THE STRESS TEST. For further information please visit [email or web address] Please follow instruction sheet, as given.  PER DR. Kirke Corin YOU HAVE BEEN CLEARED TO HAVE YOUR SURGERY Monday 11/19/11

## 2011-11-19 ENCOUNTER — Ambulatory Visit: Payer: Self-pay | Admitting: General Surgery

## 2011-11-20 NOTE — Telephone Encounter (Signed)
You are welcome and I am glad we could get the patient seen so quickly.

## 2011-11-21 ENCOUNTER — Inpatient Hospital Stay: Payer: Self-pay | Admitting: General Surgery

## 2011-11-21 LAB — COMPREHENSIVE METABOLIC PANEL
Anion Gap: 11 (ref 7–16)
BUN: 11 mg/dL (ref 7–18)
Bilirubin,Total: 1.6 mg/dL — ABNORMAL HIGH (ref 0.2–1.0)
Calcium, Total: 11.4 mg/dL — ABNORMAL HIGH (ref 8.5–10.1)
Chloride: 101 mmol/L (ref 98–107)
EGFR (Non-African Amer.): >60
Glucose: 150 mg/dL — ABNORMAL HIGH (ref 65–99)
Osmolality: 274 (ref 275–301)
SGPT (ALT): 75 U/L
Sodium: 136 mmol/L (ref 136–145)

## 2011-11-21 LAB — CBC WITH DIFFERENTIAL/PLATELET
Eosinophil #: 0.1 10*3/uL (ref 0.0–0.7)
Eosinophil %: 0.5 %
HGB: 18.7 g/dL — ABNORMAL HIGH (ref 12.0–16.0)
MCH: 28.9 pg (ref 26.0–34.0)
MCV: 88 fL (ref 80–100)
Monocyte #: 1.2 x10 3/mm — ABNORMAL HIGH (ref 0.2–0.9)
Monocyte %: 7.1 %
Neutrophil %: 82.4 %
Platelet: 294 10*3/uL (ref 150–440)
RBC: 6.47 10*6/uL — ABNORMAL HIGH (ref 3.80–5.20)

## 2011-11-21 LAB — BILIRUBIN, DIRECT: Bilirubin, Direct: 0.5 mg/dL — ABNORMAL HIGH

## 2011-11-22 LAB — BASIC METABOLIC PANEL
Anion Gap: 10 (ref 7–16)
Calcium, Total: 10.8 mg/dL — ABNORMAL HIGH (ref 8.5–10.1)
Chloride: 103 mmol/L (ref 98–107)
Co2: 24 mmol/L (ref 21–32)
Creatinine: 0.52 mg/dL — ABNORMAL LOW (ref 0.60–1.30)
Osmolality: 276 (ref 275–301)
Sodium: 137 mmol/L (ref 136–145)

## 2011-11-22 LAB — HEPATIC FUNCTION PANEL A (ARMC)
Albumin: 3.3 g/dL — ABNORMAL LOW (ref 3.4–5.0)
Alkaline Phosphatase: 102 U/L (ref 50–136)
Bilirubin, Direct: 0.4 mg/dL — ABNORMAL HIGH (ref 0.00–0.20)
SGOT(AST): 72 U/L — ABNORMAL HIGH (ref 15–37)

## 2011-11-22 LAB — CBC WITH DIFFERENTIAL/PLATELET
Basophil #: 0 10*3/uL (ref 0.0–0.1)
Basophil %: 0.2 %
HGB: 18.1 g/dL — ABNORMAL HIGH (ref 12.0–16.0)
Lymphocyte #: 2.5 10*3/uL (ref 1.0–3.6)
Monocyte #: 2 x10 3/mm — ABNORMAL HIGH (ref 0.2–0.9)
Monocytes: 9 %
Neutrophil #: 12.7 10*3/uL — ABNORMAL HIGH (ref 1.4–6.5)
Neutrophil %: 73.4 %
Platelet: 276 10*3/uL (ref 150–440)
RBC: 6.29 10*6/uL — ABNORMAL HIGH (ref 3.80–5.20)
RDW: 13.7 % (ref 11.5–14.5)
Segmented Neutrophils: 79 %
WBC: 17.2 10*3/uL — ABNORMAL HIGH (ref 3.6–11.0)

## 2011-11-22 LAB — PATHOLOGY REPORT

## 2011-11-23 LAB — BASIC METABOLIC PANEL
Anion Gap: 9 (ref 7–16)
Co2: 26 mmol/L (ref 21–32)
Creatinine: 0.53 mg/dL — ABNORMAL LOW (ref 0.60–1.30)
EGFR (African American): >60
Glucose: 125 mg/dL — ABNORMAL HIGH (ref 65–99)
Osmolality: 275 (ref 275–301)
Sodium: 138 mmol/L (ref 136–145)

## 2011-11-23 LAB — CBC WITH DIFFERENTIAL/PLATELET
Basophil #: 0 10*3/uL (ref 0.0–0.1)
Eosinophil %: 0.3 %
HCT: 46.7 % (ref 35.0–47.0)
Lymphocyte #: 1.8 10*3/uL (ref 1.0–3.6)
MCH: 28.5 pg (ref 26.0–34.0)
MCHC: 32.9 g/dL (ref 32.0–36.0)
Monocyte #: 1.4 x10 3/mm — ABNORMAL HIGH (ref 0.2–0.9)
Monocyte %: 12.3 %
Platelet: 275 10*3/uL (ref 150–440)
RBC: 5.39 10*6/uL — ABNORMAL HIGH (ref 3.80–5.20)
RDW: 13.5 % (ref 11.5–14.5)
WBC: 11.6 10*3/uL — ABNORMAL HIGH (ref 3.6–11.0)

## 2011-11-26 LAB — BASIC METABOLIC PANEL
Anion Gap: 11 (ref 7–16)
BUN: 5 mg/dL — ABNORMAL LOW (ref 7–18)
Calcium, Total: 10.4 mg/dL — ABNORMAL HIGH (ref 8.5–10.1)
Chloride: 103 mmol/L (ref 98–107)
Co2: 23 mmol/L (ref 21–32)
Creatinine: 0.59 mg/dL — ABNORMAL LOW (ref 0.60–1.30)
EGFR (African American): >60
EGFR (Non-African Amer.): >60
Sodium: 137 mmol/L (ref 136–145)

## 2011-11-26 LAB — CBC WITH DIFFERENTIAL/PLATELET
Basophil #: 0 10*3/uL (ref 0.0–0.1)
Basophil %: 0.4 %
Lymphocyte #: 2.8 10*3/uL (ref 1.0–3.6)
MCHC: 32.8 g/dL (ref 32.0–36.0)
MCV: 87 fL (ref 80–100)
Neutrophil %: 64.6 %
RDW: 13.6 % (ref 11.5–14.5)
WBC: 12.8 10*3/uL — ABNORMAL HIGH (ref 3.6–11.0)

## 2011-11-28 ENCOUNTER — Telehealth: Payer: Self-pay | Admitting: *Deleted

## 2011-11-28 DIAGNOSIS — T148XXA Other injury of unspecified body region, initial encounter: Secondary | ICD-10-CM

## 2011-11-28 NOTE — Telephone Encounter (Signed)
Yes, that is fine. She had perforated colon. May have open wound? Was not clear from surgical note from inpatient stay.

## 2011-11-28 NOTE — Telephone Encounter (Signed)
Pt left message. She had complications from her recent surgery and would like home health eval. OK?

## 2011-11-29 ENCOUNTER — Ambulatory Visit: Payer: Medicare Other | Admitting: Dietician

## 2011-11-29 ENCOUNTER — Ambulatory Visit: Payer: Self-pay | Admitting: General Surgery

## 2011-11-29 LAB — BASIC METABOLIC PANEL
Anion Gap: 8 (ref 7–16)
BUN: 8 mg/dL (ref 7–18)
Calcium, Total: 10.4 mg/dL — ABNORMAL HIGH (ref 8.5–10.1)
Chloride: 102 mmol/L (ref 98–107)
Co2: 27 mmol/L (ref 21–32)
Creatinine: 0.63 mg/dL (ref 0.60–1.30)
EGFR (African American): >60
Glucose: 120 mg/dL — ABNORMAL HIGH (ref 65–99)
Osmolality: 273 (ref 275–301)
Potassium: 3.4 mmol/L — ABNORMAL LOW (ref 3.5–5.1)
Sodium: 137 mmol/L (ref 136–145)

## 2011-11-29 LAB — CBC WITH DIFFERENTIAL/PLATELET
Basophil #: 0.1 10*3/uL (ref 0.0–0.1)
Basophil %: 0.8 %
Eosinophil #: 0.5 10*3/uL (ref 0.0–0.7)
Eosinophil %: 4 %
HGB: 14.3 g/dL (ref 12.0–16.0)
Lymphocyte #: 2.8 10*3/uL (ref 1.0–3.6)
MCH: 28.8 pg (ref 26.0–34.0)
MCV: 87 fL (ref 80–100)
Neutrophil %: 62.9 %
RDW: 13.3 % (ref 11.5–14.5)

## 2011-11-29 NOTE — Telephone Encounter (Signed)
Emma Munoz, can you please help me with this?

## 2011-11-30 ENCOUNTER — Inpatient Hospital Stay: Payer: Self-pay | Admitting: General Surgery

## 2011-12-01 LAB — CBC WITH DIFFERENTIAL/PLATELET
Basophil %: 0.7 %
Eosinophil #: 0.3 10*3/uL (ref 0.0–0.7)
HCT: 43.2 % (ref 35.0–47.0)
HGB: 14.2 g/dL (ref 12.0–16.0)
Lymphocyte #: 3.1 10*3/uL (ref 1.0–3.6)
Lymphocyte %: 30.9 %
MCH: 28.4 pg (ref 26.0–34.0)
MCHC: 32.8 g/dL (ref 32.0–36.0)
MCV: 87 fL (ref 80–100)
Monocyte #: 1 x10 3/mm — ABNORMAL HIGH (ref 0.2–0.9)
Platelet: 334 10*3/uL (ref 150–440)
RBC: 4.99 10*6/uL (ref 3.80–5.20)
WBC: 10.2 10*3/uL (ref 3.6–11.0)

## 2011-12-01 LAB — COMPREHENSIVE METABOLIC PANEL
Anion Gap: 9 (ref 7–16)
BUN: 5 mg/dL — ABNORMAL LOW (ref 7–18)
Calcium, Total: 10.5 mg/dL — ABNORMAL HIGH (ref 8.5–10.1)
Chloride: 104 mmol/L (ref 98–107)
Creatinine: 0.67 mg/dL (ref 0.60–1.30)
EGFR (African American): >60
EGFR (Non-African Amer.): >60
Glucose: 123 mg/dL — ABNORMAL HIGH (ref 65–99)
SGPT (ALT): 94 U/L — ABNORMAL HIGH
Sodium: 140 mmol/L (ref 136–145)

## 2011-12-01 LAB — URINALYSIS, COMPLETE
Bacteria: NONE SEEN
Bilirubin,UR: NEGATIVE
Glucose,UR: NEGATIVE mg/dL (ref 0–75)
Nitrite: NEGATIVE
Ph: 6 (ref 4.5–8.0)
Specific Gravity: 1.011 (ref 1.003–1.030)
Squamous Epithelial: 6

## 2011-12-01 LAB — WOUND CULTURE

## 2011-12-01 LAB — PROTIME-INR: INR: 1

## 2011-12-01 LAB — APTT: Activated PTT: 33.6 secs (ref 23.6–35.9)

## 2011-12-05 ENCOUNTER — Encounter: Payer: Self-pay | Admitting: Internal Medicine

## 2011-12-06 ENCOUNTER — Encounter: Payer: Self-pay | Admitting: Internal Medicine

## 2011-12-06 ENCOUNTER — Ambulatory Visit: Payer: Medicare Other | Admitting: Internal Medicine

## 2011-12-06 ENCOUNTER — Ambulatory Visit (INDEPENDENT_AMBULATORY_CARE_PROVIDER_SITE_OTHER): Payer: Medicare Other | Admitting: Internal Medicine

## 2011-12-06 VITALS — BP 130/88 | HR 61 | Temp 98.2°F | Ht 67.0 in | Wt 216.0 lb

## 2011-12-06 DIAGNOSIS — E1165 Type 2 diabetes mellitus with hyperglycemia: Secondary | ICD-10-CM

## 2011-12-06 DIAGNOSIS — S31109A Unspecified open wound of abdominal wall, unspecified quadrant without penetration into peritoneal cavity, initial encounter: Secondary | ICD-10-CM | POA: Insufficient documentation

## 2011-12-06 DIAGNOSIS — R109 Unspecified abdominal pain: Secondary | ICD-10-CM | POA: Insufficient documentation

## 2011-12-06 MED ORDER — OXYCODONE-ACETAMINOPHEN 5-325 MG PO TABS: 1.0000 | ORAL_TABLET | Freq: Three times a day (TID) | ORAL | Status: AC | PRN

## 2011-12-06 NOTE — Telephone Encounter (Signed)
Emma Munoz, can you please help with this.  Thanks.

## 2011-12-06 NOTE — Progress Notes (Signed)
Subjective:    Patient ID: Emma Munoz, female    DOB: 1966-01-14, 46 y.o.   MRN: [id number]  HPI 46 year old female who is status post cholecystectomy which was complicated by bowel perforation presents for followup. She was discharged from the hospital on 12/04/2011. She reports persistent abdominal distention and mild abdominal pain since discharge. She is using Percocet with improvement. She notes no bowel movement since 12/04/2011. She has been taking MiraLax, starting this morning. She denies any nausea or vomiting. She denies any fever or chills. She has not been taking her diabetes medications, and blood sugars have been well-controlled, typically running around 100 fasting. She has some decreased appetite with minimal by mouth intake.  Outpatient Encounter Prescriptions as of 12/06/2011  Medication Sig Dispense Refill  . cyclobenzaprine (FLEXERIL) 10 MG tablet Take 20 mg by mouth at bedtime.      Marland Kitchen esomeprazole (NEXIUM) 40 MG capsule Take 40 mg by mouth daily.      Marland Kitchen glucose blood (FREESTYLE LITE) test strip Test twice daily DX 250.02  100 each  3  . Lancets (FREESTYLE) lancets Use as instructed Twice daily DX: 250.02  100 each  12  . lisinopril (PRINIVIL,ZESTRIL) 20 MG tablet Take 20 mg by mouth daily.      . metoprolol succinate (TOPROL-XL) 100 MG 24 hr tablet Take 100 mg by mouth daily. Take with or immediately following a meal.      . morphine (KADIAN) 80 MG 24 hr capsule Take 80 mg by mouth 2 (two) times daily.      Marland Kitchen morphine (MSIR) 30 MG tablet Take 30 mg by mouth as needed.      . Vitamin D, Ergocalciferol, (DRISDOL) 50000 UNITS CAPS Take 50,000 Units by mouth 2 (two) times a week.      Marland Kitchen glipiZIDE-metformin (METAGLIP) 2.5-500 MG per tablet Take 1 tablet by mouth 2 (two) times daily before a meal.  60 tablet  3  . Multiple Vitamins-Minerals (MULTIVITAMIN WITH MINERALS) tablet Take 1 tablet by mouth daily.      . Omega-3 Fatty Acids (OMEGA 3 PO) Take 1 tablet by mouth daily.       Marland Kitchen oxyCODONE-acetaminophen (ROXICET) 5-325 MG per tablet Take 1 tablet by mouth every 8 (eight) hours as needed for pain.  60 tablet  0    Review of Systems  Constitutional: Positive for fatigue. Negative for fever, chills, appetite change and unexpected weight change.  HENT: Negative for ear pain, congestion, sore throat, trouble swallowing, neck pain, voice change and sinus pressure.   Eyes: Negative for visual disturbance.  Respiratory: Negative for cough, shortness of breath, wheezing and stridor.   Cardiovascular: Negative for chest pain, palpitations and leg swelling.  Gastrointestinal: Positive for abdominal pain and abdominal distention. Negative for nausea, vomiting, diarrhea, constipation, blood in stool and anal bleeding.  Genitourinary: Negative for dysuria and flank pain.  Musculoskeletal: Negative for myalgias, arthralgias and gait problem.  Skin: Negative for color change and rash.  Neurological: Negative for dizziness and headaches.  Hematological: Negative for adenopathy. Does not bruise/bleed easily.  Psychiatric/Behavioral: Negative for suicidal ideas, sleep disturbance and dysphoric mood. The patient is not nervous/anxious.    BP 130/88  Pulse 61  Temp(Src) 98.2 F (36.8 C) (Oral)  Ht 5\' 7"  (1.702 m)  Wt 216 lb (97.977 kg)  BMI 33.83 kg/m2     Objective:   Physical Exam  Constitutional: She is oriented to person, place, and time. She appears well-developed and well-nourished. No distress.  HENT:  Head: Normocephalic and atraumatic.  Right Ear: External ear normal.  Left Ear: External ear normal.  Nose: Nose normal.  Mouth/Throat: Oropharynx is clear and moist. No oropharyngeal exudate.  Eyes: Conjunctivae are normal. Pupils are equal, round, and reactive to light. Right eye exhibits no discharge. Left eye exhibits no discharge. No scleral icterus.  Neck: Normal range of motion. Neck supple. No tracheal deviation present. No thyromegaly present.    Cardiovascular: Normal rate, regular rhythm, normal heart sounds and intact distal pulses.  Exam reveals no gallop and no friction rub.   No murmur heard. Pulmonary/Chest: Effort normal and breath sounds normal. No respiratory distress. She has no wheezes. She has no rales. She exhibits no tenderness.  Abdominal: Soft. She exhibits distension. There is tenderness.    Musculoskeletal: Normal range of motion. She exhibits no edema and no tenderness.  Lymphadenopathy:    She has no cervical adenopathy.  Neurological: She is alert and oriented to person, place, and time. No cranial nerve deficit. She exhibits normal muscle tone. Coordination normal.  Skin: Skin is warm and dry. No rash noted. She is not diaphoretic. No erythema. No pallor.  Psychiatric: She has a normal mood and affect. Her behavior is normal. Judgment and thought content normal.          Assessment & Plan:

## 2011-12-06 NOTE — Assessment & Plan Note (Signed)
Secondary to recent abdominal surgery. Symptoms are improved with Percocet. Will refill this prescription today. We discussed the potential risk of this medicine, including exacerbation of constipation. She will use it as sparingly as possible. She has followup with her surgeon next week and will followup here in 2 weeks. She will call tomorrow if she has not yet had a bowel movement.

## 2011-12-06 NOTE — Telephone Encounter (Signed)
OK to begin referral process to [facility].  Thanks for setting this up.

## 2011-12-06 NOTE — Assessment & Plan Note (Signed)
Approximately 1 cm of dehiscent of abdominal wound. Will set up home health for evaluation and bandaging, as patient is having difficulty seeing the open area of her wound. There is no evidence of infection today. Will have her followup here in 2 weeks.

## 2011-12-06 NOTE — Telephone Encounter (Signed)
Pt called stating she was discharged from hospital on 4/23.  She still has section of incision that has not closed.  She was given wound care instrucitons when she left hospital, but she can not see incision to clean and change dressing.  Her husband is trying to change dressing, but does not like to look at wound.  Pt would like home health to come to house for wound care.  Pt has appt today.  Do you want to wait until pt comes in to discuss home health or is it OK to begin referral process?  Please advise.

## 2011-12-06 NOTE — Assessment & Plan Note (Signed)
Blood sugars well-controlled off medication, likely secondary to patient's poor by mouth intake. We'll continue to monitor. If blood sugars trend up above 150, will restart medication. Followup in 2 weeks.

## 2011-12-07 ENCOUNTER — Encounter (INDEPENDENT_AMBULATORY_CARE_PROVIDER_SITE_OTHER): Payer: Medicare Other | Admitting: Internal Medicine

## 2011-12-07 DIAGNOSIS — K5900 Constipation, unspecified: Secondary | ICD-10-CM

## 2011-12-07 LAB — WOUND AEROBIC CULTURE

## 2011-12-07 MED ORDER — LACTULOSE 20 GM/30ML PO SOLN: 15.0000 mL | ORAL | Status: DC | PRN

## 2011-12-11 ENCOUNTER — Encounter: Payer: Self-pay | Admitting: Internal Medicine

## 2011-12-13 ENCOUNTER — Other Ambulatory Visit: Payer: Self-pay | Admitting: *Deleted

## 2011-12-13 NOTE — Telephone Encounter (Signed)
Fine to refill 

## 2011-12-13 NOTE — Telephone Encounter (Signed)
Pharmacy request Rx for Polyethylene Glycol SPN GM #[id number] [reports Rx expired, written 12.20.10]; medication not on current med list EMR, but was on med list in [facility]. Please advise.

## 2011-12-14 MED ORDER — POLYETHYLENE GLYCOL 3350 17 GM/SCOOP PO POWD: 17.0000 g | Freq: Every day | ORAL | Status: AC

## 2011-12-14 NOTE — Telephone Encounter (Signed)
Done

## 2011-12-20 ENCOUNTER — Encounter: Payer: Self-pay | Admitting: Internal Medicine

## 2011-12-21 ENCOUNTER — Ambulatory Visit (INDEPENDENT_AMBULATORY_CARE_PROVIDER_SITE_OTHER): Payer: Medicare Other | Admitting: Internal Medicine

## 2011-12-21 ENCOUNTER — Encounter: Payer: Self-pay | Admitting: Internal Medicine

## 2011-12-21 ENCOUNTER — Telehealth: Payer: Self-pay

## 2011-12-21 VITALS — BP 140/90 | HR 91 | Temp 98.7°F | Ht 67.0 in | Wt 213.5 lb

## 2011-12-21 DIAGNOSIS — R5383 Other fatigue: Secondary | ICD-10-CM

## 2011-12-21 DIAGNOSIS — R140 Abdominal distension (gaseous): Secondary | ICD-10-CM | POA: Insufficient documentation

## 2011-12-21 DIAGNOSIS — R142 Eructation: Secondary | ICD-10-CM

## 2011-12-21 DIAGNOSIS — E210 Primary hyperparathyroidism: Secondary | ICD-10-CM

## 2011-12-21 DIAGNOSIS — R5381 Other malaise: Secondary | ICD-10-CM

## 2011-12-21 DIAGNOSIS — E1165 Type 2 diabetes mellitus with hyperglycemia: Secondary | ICD-10-CM

## 2011-12-21 LAB — CBC WITH DIFFERENTIAL/PLATELET
Basophils Relative: 0.6 % (ref 0.0–3.0)
Eosinophils Absolute: 0.2 10*3/uL (ref 0.0–0.7)
HCT: 46.7 % — ABNORMAL HIGH (ref 36.0–46.0)
Hemoglobin: 15.6 g/dL — ABNORMAL HIGH (ref 12.0–15.0)
Lymphocytes Relative: 32.4 % (ref 12.0–46.0)
Lymphs Abs: 2 10*3/uL (ref 0.7–4.0)
MCHC: 33.4 g/dL (ref 30.0–36.0)
MCV: 86.6 fl (ref 78.0–100.0)
Monocytes Absolute: 0.3 10*3/uL (ref 0.1–1.0)
Neutro Abs: 3.6 10*3/uL (ref 1.4–7.7)
RBC: 5.39 Mil/uL — ABNORMAL HIGH (ref 3.87–5.11)

## 2011-12-21 LAB — COMPREHENSIVE METABOLIC PANEL
Albumin: 3.8 g/dL (ref 3.5–5.2)
Alkaline Phosphatase: 108 U/L (ref 39–117)
BUN: 6 mg/dL (ref 6–23)
CO2: 26 mEq/L (ref 19–32)
GFR: 124.97 mL/min (ref >60.00)
Glucose, Bld: 168 mg/dL — ABNORMAL HIGH (ref 70–99)
Potassium: 4 mEq/L (ref 3.5–5.1)
Total Bilirubin: 0.6 mg/dL (ref 0.3–1.2)

## 2011-12-21 LAB — TSH: TSH: 0.57 u[IU]/mL (ref 0.35–5.50)

## 2011-12-21 NOTE — Assessment & Plan Note (Signed)
Blood sugars have been much lower recently likely secondary to patient's poor appetite and decreased by mouth intake. We'll continue to monitor off medication.

## 2011-12-21 NOTE — Assessment & Plan Note (Addendum)
Given her recent episode of bowel perforation after cholecystectomy, symptoms are concerning for ileus versus bowel obstruction. Encouraged her to get a plain x-ray of the abdomen today. She would prefer to wait until Monday. We discussed that if her symptoms worsen, with any persistent nausea, vomiting, worsening abdominal pain that she will need to be seen in the ER over the weekend. She will try using a Fleets enema. Will check electrolytes and thyroid function with labs today.

## 2011-12-21 NOTE — Progress Notes (Signed)
Subjective:    Patient ID: Emma Munoz, female    DOB: 1966-06-04, 46 y.o.   MRN: [id number]  HPI 46 year old female with history of diabetes, primary hyperparathyroidism, and recent cholecystectomy complicated by bowel perforation presents for followup. Her primary concern today is diffuse abdominal distention and constipation. She reports she has not had a regular bowel movement in over a week. She did pass some small pebbles of stool this morning. She has mild diffuse abdominal pain. She feels nauseous. She has not had any vomiting. She has not had any fever or chills.  In regards to her diabetes, she reports that her blood sugars continue to remain less than 150, mostly less than 100 without the use of her medication. Her appetite has been poor which may be contributing.  She is also concerned today about progressive fatigue. She has difficulty functioning. She reports that she was seen by endocrinology at [facility] and they did not move forward with further evaluation of her hyperparathyroidism. She is scheduled for followup there in July.  Outpatient Encounter Prescriptions as of 12/21/2011  Medication Sig Dispense Refill  . cyclobenzaprine (FLEXERIL) 10 MG tablet Take 20 mg by mouth at bedtime.      Marland Kitchen esomeprazole (NEXIUM) 40 MG capsule Take 40 mg by mouth daily.      Marland Kitchen glipiZIDE-metformin (METAGLIP) 2.5-500 MG per tablet Take 1 tablet by mouth 2 (two) times daily before a meal.  60 tablet  3  . glucose blood (FREESTYLE LITE) test strip Test twice daily DX 250.02  100 each  3  . Lactulose 20 GM/30ML SOLN Take 15 mLs (10 g total) by mouth every 2 (two) hours as needed.  240 mL  0  . Lancets (FREESTYLE) lancets Use as instructed Twice daily DX: 250.02  100 each  12  . lisinopril (PRINIVIL,ZESTRIL) 20 MG tablet Take 20 mg by mouth daily.      . metoprolol succinate (TOPROL-XL) 100 MG 24 hr tablet Take 100 mg by mouth daily. Take with or immediately following a meal.      . morphine (KADIAN)  80 MG 24 hr capsule Take 80 mg by mouth 2 (two) times daily.      Marland Kitchen morphine (MSIR) 30 MG tablet Take 30 mg by mouth as needed.      . Multiple Vitamins-Minerals (MULTIVITAMIN WITH MINERALS) tablet Take 1 tablet by mouth daily.      . Omega-3 Fatty Acids (OMEGA 3 PO) Take 1 tablet by mouth daily.      . Vitamin D, Ergocalciferol, (DRISDOL) 50000 UNITS CAPS Take 50,000 Units by mouth 2 (two) times a week.        Review of Systems  Constitutional: Positive for fatigue. Negative for fever, chills, appetite change and unexpected weight change.  HENT: Negative for ear pain, congestion, sore throat, trouble swallowing, neck pain, voice change and sinus pressure.   Eyes: Negative for visual disturbance.  Respiratory: Negative for cough, shortness of breath, wheezing and stridor.   Cardiovascular: Negative for chest pain, palpitations and leg swelling.  Gastrointestinal: Positive for nausea, constipation and abdominal distention. Negative for vomiting, abdominal pain, diarrhea, blood in stool and anal bleeding.  Genitourinary: Negative for dysuria and flank pain.  Musculoskeletal: Negative for myalgias, arthralgias and gait problem.  Skin: Negative for color change and rash.  Neurological: Negative for dizziness and headaches.  Hematological: Negative for adenopathy. Does not bruise/bleed easily.  Psychiatric/Behavioral: Negative for suicidal ideas, sleep disturbance and dysphoric mood. The patient is not nervous/anxious.  BP 140/90  Pulse 91  Temp(Src) 98.7 F (37.1 C) (Oral)  Ht 5\' 7"  (1.702 m)  Wt 213 lb 8 oz (96.843 kg)  BMI 33.44 kg/m2     Objective:   Physical Exam  Constitutional: She is oriented to person, place, and time. She appears well-developed and well-nourished. No distress.  HENT:  Head: Normocephalic and atraumatic.  Right Ear: External ear normal.  Left Ear: External ear normal.  Nose: Nose normal.  Mouth/Throat: Oropharynx is clear and moist. No oropharyngeal  exudate.  Eyes: Conjunctivae are normal. Pupils are equal, round, and reactive to light. Right eye exhibits no discharge. Left eye exhibits no discharge. No scleral icterus.  Neck: Normal range of motion. Neck supple. No tracheal deviation present. No thyromegaly present.  Cardiovascular: Normal rate, regular rhythm, normal heart sounds and intact distal pulses.  Exam reveals no gallop and no friction rub.   No murmur heard. Pulmonary/Chest: Effort normal and breath sounds normal. No respiratory distress. She has no wheezes. She has no rales. She exhibits no tenderness.  Abdominal: Soft. Bowel sounds are normal. She exhibits distension. She exhibits no mass. There is tenderness. There is no rebound and no guarding.  Musculoskeletal: Normal range of motion. She exhibits no edema and no tenderness.  Lymphadenopathy:    She has no cervical adenopathy.  Neurological: She is alert and oriented to person, place, and time. No cranial nerve deficit. She exhibits normal muscle tone. Coordination normal.  Skin: Skin is warm and dry. No rash noted. She is not diaphoretic. No erythema. No pallor.  Psychiatric: She has a normal mood and affect. Her behavior is normal. Judgment and thought content normal.          Assessment & Plan:

## 2011-12-21 NOTE — Telephone Encounter (Signed)
Message copied by Festus Aloe on Fri Dec 21, 2011  2:07 PM ------      Message from: Lorine Bears A      Created: Fri Dec 21, 2011 12:16 PM       Please cancel her stress test. Ask her to call us when she is feeling better to reschedule it.             ----- Message -----         From: Shelia Media, MD         Sent: 12/21/2011  12:10 PM           To: Iran Ouch, MD            Jerolyn Center,      Ms. Kjos is pretty sick right now. I am worried about bowel obstruction.  Can you help me postpone her stress test that is scheduled next week?      Thanks!      Candise Bowens

## 2011-12-21 NOTE — Telephone Encounter (Signed)
Notified patient we will cancel her stress test that is scheduled for Dec 26, 2011 @ [facility]. The patient will call back per Dr. Dan Humphreys when feeling better.

## 2011-12-21 NOTE — Assessment & Plan Note (Signed)
Will set nuclear medicine scan to look for parathyroid adenoma. Will set up with local endocrinologist. Will repeat calcium and parathyroid hormone levels today.

## 2011-12-24 DIAGNOSIS — I10 Essential (primary) hypertension: Secondary | ICD-10-CM

## 2011-12-24 DIAGNOSIS — F329 Major depressive disorder, single episode, unspecified: Secondary | ICD-10-CM

## 2011-12-24 DIAGNOSIS — F3289 Other specified depressive episodes: Secondary | ICD-10-CM

## 2011-12-24 DIAGNOSIS — T8131XA Disruption of external operation (surgical) wound, not elsewhere classified, initial encounter: Secondary | ICD-10-CM

## 2011-12-24 DIAGNOSIS — E119 Type 2 diabetes mellitus without complications: Secondary | ICD-10-CM

## 2011-12-26 ENCOUNTER — Telehealth: Payer: Self-pay | Admitting: Internal Medicine

## 2011-12-26 NOTE — Telephone Encounter (Signed)
Patient wants to know is it necessary for her to go to Diabetes Management appointment.

## 2011-12-26 NOTE — Telephone Encounter (Signed)
If she is not feeling well, she can reschedule.

## 2011-12-27 NOTE — Telephone Encounter (Signed)
Patient notified

## 2011-12-28 ENCOUNTER — Ambulatory Visit: Payer: Self-pay | Admitting: General Surgery

## 2011-12-28 LAB — COMPREHENSIVE METABOLIC PANEL
Albumin: 3.7 g/dL (ref 3.4–5.0)
Alkaline Phosphatase: 118 U/L (ref 50–136)
BUN: 7 mg/dL (ref 7–18)
Bilirubin,Total: 0.6 mg/dL (ref 0.2–1.0)
Calcium, Total: 10.1 mg/dL (ref 8.5–10.1)
Chloride: 104 mmol/L (ref 98–107)
Co2: 26 mmol/L (ref 21–32)
Glucose: 133 mg/dL — ABNORMAL HIGH (ref 65–99)
Osmolality: 274 (ref 275–301)
Potassium: 3.8 mmol/L (ref 3.5–5.1)
SGOT(AST): 46 U/L — ABNORMAL HIGH (ref 15–37)

## 2011-12-28 LAB — CBC WITH DIFFERENTIAL/PLATELET
Basophil %: 1.2 %
HCT: 44.4 % (ref 35.0–47.0)
Lymphocyte #: 3.8 10*3/uL — ABNORMAL HIGH (ref 1.0–3.6)
MCH: 28.7 pg (ref 26.0–34.0)
MCHC: 33.3 g/dL (ref 32.0–36.0)
MCV: 86 fL (ref 80–100)
Monocyte #: 0.6 x10 3/mm (ref 0.2–0.9)
Neutrophil #: 3.5 10*3/uL (ref 1.4–6.5)
Neutrophil %: 42.4 %
Platelet: 247 10*3/uL (ref 150–440)
RBC: 5.16 10*6/uL (ref 3.80–5.20)
RDW: 13.1 % (ref 11.5–14.5)
WBC: 8.2 10*3/uL (ref 3.6–11.0)

## 2011-12-31 ENCOUNTER — Ambulatory Visit: Payer: Medicare Other | Admitting: Cardiovascular Disease

## 2011-12-31 ENCOUNTER — Ambulatory Visit (HOSPITAL_COMMUNITY)
Admission: RE | Admit: 2011-12-31 | Discharge: 2011-12-31 | Disposition: A | Discharge status: Home / Self Care | Payer: Medicare Other | Source: Ambulatory Visit | Attending: Internal Medicine | Admitting: Internal Medicine

## 2011-12-31 ENCOUNTER — Encounter (HOSPITAL_COMMUNITY)
Admission: RE | Admit: 2011-12-31 | Discharge: 2011-12-31 | Disposition: A | Discharge status: Home / Self Care | Payer: Medicare Other | Source: Ambulatory Visit | Attending: Internal Medicine | Admitting: Internal Medicine

## 2011-12-31 DIAGNOSIS — E210 Primary hyperparathyroidism: Secondary | ICD-10-CM | POA: Insufficient documentation

## 2011-12-31 MED ORDER — TECHNETIUM TC 99M SESTAMIBI - CARDIOLITE
25.0000 | Freq: Once | INTRAVENOUS | Status: AC | PRN
  Administered 2011-12-31: 25 via INTRAVENOUS

## 2012-01-04 ENCOUNTER — Encounter: Payer: Self-pay | Admitting: Internal Medicine

## 2012-01-09 ENCOUNTER — Ambulatory Visit: Payer: Medicare Other | Admitting: Internal Medicine

## 2012-01-14 ENCOUNTER — Ambulatory Visit: Payer: Medicare Other

## 2012-01-27 ENCOUNTER — Observation Stay: Payer: Self-pay | Admitting: General Surgery

## 2012-01-28 LAB — CBC WITH DIFFERENTIAL/PLATELET
Basophil #: 0.1 10*3/uL (ref 0.0–0.1)
Eosinophil #: 0.2 10*3/uL (ref 0.0–0.7)
Eosinophil %: 2.3 %
HGB: 15.7 g/dL (ref 12.0–16.0)
Lymphocyte #: 3.5 10*3/uL (ref 1.0–3.6)
MCH: 28.2 pg (ref 26.0–34.0)
MCHC: 33 g/dL (ref 32.0–36.0)
MCV: 85 fL (ref 80–100)
Monocyte %: 5.4 %
Neutrophil #: 4.1 10*3/uL (ref 1.4–6.5)
RBC: 5.58 10*6/uL — ABNORMAL HIGH (ref 3.80–5.20)

## 2012-01-28 LAB — BASIC METABOLIC PANEL
BUN: 9 mg/dL (ref 7–18)
Calcium, Total: 10.1 mg/dL (ref 8.5–10.1)
Chloride: 108 mmol/L — ABNORMAL HIGH (ref 98–107)
Co2: 26 mmol/L (ref 21–32)
Creatinine: 0.67 mg/dL (ref 0.60–1.30)
EGFR (African American): >60
EGFR (Non-African Amer.): >60
Sodium: 139 mmol/L (ref 136–145)

## 2012-02-01 ENCOUNTER — Other Ambulatory Visit: Payer: Self-pay | Admitting: *Deleted

## 2012-02-01 ENCOUNTER — Encounter: Payer: Self-pay | Admitting: Internal Medicine

## 2012-02-01 MED ORDER — LINACLOTIDE 145 MCG PO CAPS: 1.0000 | ORAL_CAPSULE | Freq: Every day | ORAL | Status: DC

## 2012-02-06 ENCOUNTER — Encounter: Payer: Self-pay | Admitting: Internal Medicine

## 2012-02-06 ENCOUNTER — Ambulatory Visit (INDEPENDENT_AMBULATORY_CARE_PROVIDER_SITE_OTHER): Payer: Medicare Other | Admitting: Internal Medicine

## 2012-02-06 ENCOUNTER — Telehealth: Payer: Self-pay | Admitting: Internal Medicine

## 2012-02-06 VITALS — BP 148/100 | HR 68 | Ht 67.0 in | Wt 216.8 lb

## 2012-02-06 DIAGNOSIS — K5900 Constipation, unspecified: Secondary | ICD-10-CM

## 2012-02-06 DIAGNOSIS — F1910 Other psychoactive substance abuse, uncomplicated: Secondary | ICD-10-CM

## 2012-02-06 DIAGNOSIS — F1190 Opioid use, unspecified, uncomplicated: Secondary | ICD-10-CM

## 2012-02-06 DIAGNOSIS — K5909 Other constipation: Secondary | ICD-10-CM

## 2012-02-06 DIAGNOSIS — K589 Irritable bowel syndrome without diarrhea: Secondary | ICD-10-CM

## 2012-02-06 DIAGNOSIS — R110 Nausea: Secondary | ICD-10-CM

## 2012-02-06 DIAGNOSIS — R109 Unspecified abdominal pain: Secondary | ICD-10-CM

## 2012-02-06 MED ORDER — LINACLOTIDE 290 MCG PO CAPS: 290.0000 ug | ORAL_CAPSULE | Freq: Every day | ORAL | Status: DC

## 2012-02-06 MED ORDER — ONDANSETRON 4 MG PO TBDP: 4.0000 mg | ORAL_TABLET | Freq: Three times a day (TID) | ORAL | Status: AC | PRN

## 2012-02-06 NOTE — Progress Notes (Signed)
Munoz ID: Emma Munoz, female   DOB: [birth year], 46 y.o.   MRN: [id number]  SUBJECTIVE: HPI Emma Munoz is a 46 yo female with PMH of diabetes, hypertension, GERD, chronic constipation, chronic pain due to a right hand injury on chronic narcotics, and hyperparathyroidism with hypercalcemia who seen in consultation at Emma request of Dr. Dan Humphreys for evaluation of abdominal pain and constipation. Emma Munoz reports chronic long-standing constipation which has been worse over Emma last few years. She reports without using over-Emma-counter laxatives such as magnesium citrate she will have a bowel movement except for every 10-14 day. Her constipation is associated with lower abdominal pain, significant abdominal bloating. She states she "constantly feels pregnant". She does report intermittent nausea, occasional vomiting. There are days when Emma nausea and vomiting tend to be worse. Of note she did have a cholecystectomy in April 2013 which was complicated by small bowel injury. This required rehospitalization with segmental small bowel resection. She lost probably 30 pounds around Emma surgery and has gained some of this back. She does not have heartburn when taking her Nexium. No dysphagia or odynophagia.  She has had extensive GI workups in Emma past both in [name], at [name], and at [name]. She reports having undergone EGD and colonoscopy in [name] in approximately 2006. She does recall having anorectal manometry and biofeedback therapy. She was told that her anal manometry was normal, and she got no benefit from biofeedback. She also recalls gastric imaging study being delayed. This was performed at [name]. She has tried Reglan in Emma past, she does not remember Emma dose, but she does remember it did not help significantly.  Dr. Dan Humphreys started her on LINZESS 145 mcg 2 days ago. She took a dose yesterday but also took magnesium citrate. She did have some mild diarrhea  yesterday. She took a dose of LINZESS this morning has not had a bowel movement.  Review of Systems  As per history of present illness, otherwise negative, except for occasional back pain, fatigue, and vision changes  Past Medical History  Diagnosis Date  . Diabetes mellitus   . Hypertension   . Tobacco abuse     Current Outpatient Prescriptions  Medication Sig Dispense Refill  . cyclobenzaprine (FLEXERIL) 10 MG tablet Take 20 mg by mouth at bedtime.      Marland Kitchen esomeprazole (NEXIUM) 40 MG capsule Take 40 mg by mouth daily.      Marland Kitchen glucose blood (FREESTYLE LITE) test strip Test twice daily DX 250.02  100 each  3  . Lancets (FREESTYLE) lancets Use as instructed Twice daily DX: 250.02  100 each  12  . Linaclotide (LINZESS) 290 MCG CAPS Take 290 mcg by mouth daily.  30 capsule  2  . lisinopril (PRINIVIL,ZESTRIL) 20 MG tablet Take 20 mg by mouth daily.      . metoprolol succinate (TOPROL-XL) 100 MG 24 hr tablet Take 100 mg by mouth daily. Take with or immediately following a meal.      . morphine (KADIAN) 80 MG 24 hr capsule Take 80 mg by mouth 2 (two) times daily.      Marland Kitchen morphine (MSIR) 30 MG tablet Take 30 mg by mouth as needed.      . ondansetron (ZOFRAN ODT) 4 MG disintegrating tablet Take 1 tablet (4 mg total) by mouth every 8 (eight) hours as needed for nausea.  20 tablet  0  . Vitamin D, Ergocalciferol, (DRISDOL) 50000 UNITS CAPS Take 50,000 Units by  mouth 2 (two) times a week.      Marland Kitchen DISCONTD: Linaclotide (LINZESS) 145 MCG CAPS Take 1 capsule by mouth daily.  30 capsule  3    Allergies  Allergen Reactions  . Aspirin     REACTION: Tongue, throat, extremities swell  . Ibuprofen     REACTION: Tongue, throat, extremities swell  . Iohexol      Desc: N&V after Gadolinium (Magnevist) injection, Onset Date: [id number]   . Nsaids     REACTION: Tongue, throat, extremities swell    Family History  Problem Relation Age of Onset  . Early death Father 71    GUNSHOT  . Kidney disease  Brother     s/p transplant  . Heart disease Maternal Grandmother     Heart defect  . Prostate cancer Maternal Grandfather     History  Substance Use Topics  . Smoking status: Current Everyday Smoker -- 1.0 packs/day  . Smokeless tobacco: Never Used  . Alcohol Use: Yes     Rarely    OBJECTIVE: BP 148/100  Pulse 68  Ht 5\' 7"  (1.702 m)  Wt 216 lb 12.8 oz (98.34 kg)  BMI 33.96 kg/m2 Constitutional: Well-developed and well-nourished. No distress. HEENT: Normocephalic and atraumatic. Oropharynx is clear and moist. No oropharyngeal exudate. Conjunctivae are normal. Pupils are equal round and reactive to light. No scleral icterus. Neck: Neck supple. Trachea midline. Cardiovascular: Normal rate, regular rhythm and intact distal pulses. No M/R/G Pulmonary/chest: Effort normal and breath sounds normal. No wheezing, rales or rhonchi. Abdominal: Soft, obese, mildly distended, nontender. Bowel sounds active throughout. There are no masses palpable. No hepatosplenomegaly. Extremities: no clubbing, cyanosis, with trace pretibial edema Lymphadenopathy: No cervical adenopathy noted. Neurological: Alert and oriented to person place and time. Skin: Skin is warm and dry. No rashes noted. Psychiatric: Normal mood and affect. Behavior is normal.  Labs and Imaging -- CBC    Component Value Date/Time   WBC 6.3 12/21/2011 1432   RBC 5.39* 12/21/2011 1432   HGB 15.6* 12/21/2011 1432   HCT 46.7* 12/21/2011 1432   PLT 232.0 12/21/2011 1432   MCV 86.6 12/21/2011 1432   MCHC 33.4 12/21/2011 1432   RDW 13.9 12/21/2011 1432   LYMPHSABS 2.0 12/21/2011 1432   MONOABS 0.3 12/21/2011 1432   EOSABS 0.2 12/21/2011 1432   BASOSABS 0.0 12/21/2011 1432    CMP     Component Value Date/Time   NA 138 12/21/2011 1432   K 4.0 12/21/2011 1432   CL 104 12/21/2011 1432   CO2 26 12/21/2011 1432   GLUCOSE 168* 12/21/2011 1432   BUN 6 12/21/2011 1432   CREATININE 0.7 12/21/2011 1432   CREATININE 0.69 10/23/2011 1631   CALCIUM  11.0* 12/21/2011 1432   CALCIUM 10.8* 12/21/2011 1432   PROT 7.4 12/21/2011 1432   ALBUMIN 3.8 12/21/2011 1432   AST 50* 12/21/2011 1432   ALT 50* 12/21/2011 1432   ALKPHOS 108 12/21/2011 1432   BILITOT 0.6 12/21/2011 1432   GFRNONAA 84 07/14/2008 1257   GFRAA 102 07/14/2008 1257   TSH 0.57  Gastric imaging study dated October 2011 NUCLEAR MEDICINE GASTRIC EMPTYING SCAN    Technique:  After oral ingestion of radiolabeled meal, sequential abdominal images were obtained for 120 minutes.  Residual percentage of activity remaining within Emma stomach was calculated at 60 and 120 minutes.    Radiopharmaceutical: 2  mCi Tc-67m sulfur colloid.    Comparison:  CT abdomen pelvis of 05/01/2010    Findings: Emma Munoz  was given 2 mCi of technetium 46m sulfur colloid scrambled into an egg and taken orally.  Imaging over Emma abdomen was performed.  At 2 hours, 17% of Emma radionuclide remains within Emma stomach.  30% or less remaining at 2 hours is within normal limits.    IMPRESSION: Normal nuclear medicine gastric emptying study.   ASSESSMENT AND PLAN: 46 yo female with PMH of diabetes, hypertension, GERD, chronic constipation, chronic pain due to a right hand injury on chronic narcotics, and hyperparathyroidism with hypercalcemia who seen in consultation at Emma request of Dr. Dan Humphreys for evaluation of abdominal pain and constipation.  1. Constipation/abd bloating/lower abd cramping pain -- Emma Munoz's constellation of symptoms, most notably her constipation and lower abdominal pain is very likely multifactorial. 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 I also feel that her hypercalcemia is definitely playing a role in her slow bowel transit and constipation. Her hyperparathyroidism and hypercalcemia is currently being evaluated by an endocrinologist in [place], [place]. I completely agree with Emma Linzess therapy, and have decided to  increase Emma dose to 290 mcg daily. This dose is indicated for IBS-C and may help with her abdominal pain as well. Certainly think if she has more regular bowel habit she will feel better. Other potential etiologies include small bowel bacterial overgrowth, but I will hold off on empiric treatment for now. --We need to obtain her prior records from [facility], her GI doctor in [place], [facility], and [facility]. This will help piece together her prior workup and avoid unnecessary retesting. --I'll see her back in one month to gauge her response to therapy.  2. Nausea/vomiting -- this is intermittent and may be related to gastroparesis. Her gastroparesis risk factors are diabetes and chronic narcotic use. Interestingly, her gastric emptying study from 2011 was normal, but I would not repeat this on narcotics as invariably narcotics delayed gastric emptying. At present I would like to work on constipation first and see if this helps her upper symptoms. She's not currently on an anti-medic and therefore I will give her Zofran 48 mg every 8 hours as needed for nausea.  In Emma interim she will continue to work with her endocrinologist and hopefully we can lower her calcium which I think will help her overall GI function/motility.

## 2012-02-06 NOTE — Patient Instructions (Addendum)
You have been given a separate informational sheet regarding your tobacco use, the importance of quitting and local resources to help you quit.  We have sent the following medications to your pharmacy for you to pick up at your convenience: Linzess; Zofran   Follow up with Dr. Rhea Belton in office in 6 weeks

## 2012-02-06 NOTE — Telephone Encounter (Signed)
Forward to Dr. Erick Blinks for review on 02-06-12 ym

## 2012-02-19 LAB — CBC WITH DIFFERENTIAL/PLATELET
Basophil #: 0.1 10*3/uL (ref 0.0–0.1)
Lymphocyte #: 1.5 10*3/uL (ref 1.0–3.6)
MCV: 85 fL (ref 80–100)
Monocyte %: 3 %
Neutrophil %: 83.8 %
Platelet: 308 10*3/uL (ref 150–440)
RBC: 6.12 10*6/uL — ABNORMAL HIGH (ref 3.80–5.20)
RDW: 13.6 % (ref 11.5–14.5)
WBC: 11.6 10*3/uL — ABNORMAL HIGH (ref 3.6–11.0)

## 2012-02-19 LAB — BASIC METABOLIC PANEL
Anion Gap: 9 (ref 7–16)
BUN: 8 mg/dL (ref 7–18)
Creatinine: 0.81 mg/dL (ref 0.60–1.30)
EGFR (African American): >60
EGFR (Non-African Amer.): >60
Glucose: 119 mg/dL — ABNORMAL HIGH (ref 65–99)
Osmolality: 284 (ref 275–301)

## 2012-02-20 ENCOUNTER — Telehealth: Payer: Self-pay | Admitting: Gastroenterology

## 2012-02-20 ENCOUNTER — Observation Stay: Payer: Self-pay | Admitting: General Surgery

## 2012-02-20 LAB — URINALYSIS, COMPLETE
Bilirubin,UR: NEGATIVE
Glucose,UR: NEGATIVE mg/dL (ref 0–75)
Ketone: NEGATIVE
Leukocyte Esterase: NEGATIVE
Ph: 6 (ref 4.5–8.0)
RBC,UR: 9 /HPF (ref 0–5)
Squamous Epithelial: 4
WBC UR: 5 /HPF (ref 0–5)

## 2012-02-20 NOTE — Telephone Encounter (Signed)
Per Dr. Rhea Belton faxed records to Dr. Birdie Sons

## 2012-02-21 LAB — BASIC METABOLIC PANEL
Creatinine: 0.7 mg/dL (ref 0.60–1.30)
Potassium: 4.1 mmol/L (ref 3.5–5.1)
Sodium: 140 mmol/L (ref 136–145)

## 2012-02-21 LAB — CBC WITH DIFFERENTIAL/PLATELET
Basophil %: 0.5 %
Eosinophil #: 0.1 10*3/uL (ref 0.0–0.7)
HCT: 42.2 % (ref 35.0–47.0)
Lymphocyte #: 4 10*3/uL — ABNORMAL HIGH (ref 1.0–3.6)
MCH: 28.3 pg (ref 26.0–34.0)
MCHC: 33.2 g/dL (ref 32.0–36.0)
Monocyte #: 0.4 x10 3/mm (ref 0.2–0.9)
Neutrophil #: 2.6 10*3/uL (ref 1.4–6.5)
Neutrophil %: 36.2 %
RBC: 4.94 10*6/uL (ref 3.80–5.20)
WBC: 7.1 10*3/uL (ref 3.6–11.0)

## 2012-02-28 ENCOUNTER — Telehealth: Payer: Self-pay | Admitting: Cardiovascular Disease

## 2012-02-28 NOTE — Telephone Encounter (Signed)
Pt needs clearance for right inferior thyroidectomy. Dr Chestine Spore thinks he heard a murmur on exam today

## 2012-02-28 NOTE — Telephone Encounter (Signed)
Do you need to see pt before clearing her for surg. She saw you last in April

## 2012-02-29 ENCOUNTER — Other Ambulatory Visit: Payer: Self-pay

## 2012-02-29 DIAGNOSIS — R011 Cardiac murmur, unspecified: Secondary | ICD-10-CM

## 2012-02-29 NOTE — Telephone Encounter (Signed)
Can you please schedule. Thanks.

## 2012-02-29 NOTE — Telephone Encounter (Signed)
She needs to have an echocardiogram before surgery.

## 2012-03-04 NOTE — Telephone Encounter (Signed)
Left message for pt to call the office.

## 2012-03-10 ENCOUNTER — Telehealth: Payer: Self-pay | Admitting: Internal Medicine

## 2012-03-10 NOTE — Telephone Encounter (Signed)
Caller: Aminat/Patient; Phone Number: [phone]; Message from caller: Pt.  Calling, wants to know if she can have home health RN to give her IVF's at home vs being admitted to the hospital.  Had labs drawn today.   She stressed that "I do not want to go back to the hospital".  I advised that this was MD decision.

## 2012-03-10 NOTE — Telephone Encounter (Signed)
Pt called wanting to know if we could get home health nurse to come back out.  To give her iv fluid.  Dr Lemar Livings told her that the iv fluid help wash out the calculim . Please advise asap  Pt stated she is starting to get sick again and does not want to be admitted in hospital again

## 2012-03-10 NOTE — Telephone Encounter (Signed)
IV fluid would only be a temporizing measure.  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 Chestine Spore.

## 2012-03-10 NOTE — Telephone Encounter (Signed)
We need to see her and discuss before setting this up.

## 2012-03-10 NOTE — Telephone Encounter (Signed)
Patient advised as instructed via telephone, she would really like home health nurse to come to her home to give IV.  She stated that she always ends up back in the hospital and she doesn't want that.  She stated that Dr. Chestine Spore cancelled her surgery because he wants another thyroid scan done and more blood work before he does the surgery.  She would like to [place] the same home health agency that she used before.  Please advise.

## 2012-03-11 NOTE — Telephone Encounter (Signed)
We have no appointments for this week, ok for her to wait until next week?

## 2012-03-11 NOTE — Telephone Encounter (Signed)
Patient scheduled for 03/13/2012 at 11:00 with Dr. Dan Humphreys.

## 2012-03-11 NOTE — Telephone Encounter (Signed)
OK to wait

## 2012-03-12 ENCOUNTER — Other Ambulatory Visit: Payer: Medicare Other

## 2012-03-12 ENCOUNTER — Telehealth: Payer: Self-pay | Admitting: Internal Medicine

## 2012-03-12 ENCOUNTER — Telehealth: Payer: Self-pay | Admitting: *Deleted

## 2012-03-12 NOTE — Telephone Encounter (Signed)
Patient called stating that she is doubled over in pain, vomiting, and she can't keep anything down.  I spoke with Dr. Dan Humphreys and she advised that patient should go to the ED because she will probably need IV fluids and possibly IV narcotics.  Patient was advised as instructed by Dr. Dan Humphreys and she stated that she is not agreeing to go to the ED.  I advised patient that this is for her best interest, she verbalized understanding but stated that she may or may not go to the ED.

## 2012-03-12 NOTE — Telephone Encounter (Signed)
Patient called c/o N,V, and abdominal pain / bloating. Chart reviewed. Quite complicated.Marland KitchenMarland KitchenHas seen MANY GI groups for chronic problems with the same. New to our group. She says she is moving her bowels well. She says that she can't keep her medications down and is afraid of narcotic withdraw. She says that "they usually put me in the hospital and put me on IVs..." I advised to go to ER ASAP for evaluation.

## 2012-03-13 ENCOUNTER — Encounter: Payer: Self-pay | Admitting: Internal Medicine

## 2012-03-13 ENCOUNTER — Ambulatory Visit: Payer: Medicare Other | Admitting: Internal Medicine

## 2012-03-13 DIAGNOSIS — Z0289 Encounter for other administrative examinations: Secondary | ICD-10-CM

## 2012-03-13 LAB — COMPREHENSIVE METABOLIC PANEL
Albumin: 3.9 g/dL (ref 3.4–5.0)
Alkaline Phosphatase: 140 U/L — ABNORMAL HIGH (ref 50–136)
Anion Gap: 8 (ref 7–16)
BUN: 10 mg/dL (ref 7–18)
Bilirubin,Total: 1.2 mg/dL — ABNORMAL HIGH (ref 0.2–1.0)
Calcium, Total: 11 mg/dL — ABNORMAL HIGH (ref 8.5–10.1)
Creatinine: 0.7 mg/dL (ref 0.60–1.30)
EGFR (Non-African Amer.): >60
Glucose: 136 mg/dL — ABNORMAL HIGH (ref 65–99)
Osmolality: 279 (ref 275–301)
Potassium: 3.7 mmol/L (ref 3.5–5.1)
Sodium: 139 mmol/L (ref 136–145)

## 2012-03-13 LAB — CBC
HCT: 53.2 % — ABNORMAL HIGH (ref 35.0–47.0)
HGB: 18.1 g/dL — ABNORMAL HIGH (ref 12.0–16.0)
MCH: 28.4 pg (ref 26.0–34.0)
Platelet: 305 10*3/uL (ref 150–440)
RBC: 6.4 10*6/uL — ABNORMAL HIGH (ref 3.80–5.20)

## 2012-03-13 LAB — URINALYSIS, COMPLETE
Bacteria: NONE SEEN
Bilirubin,UR: NEGATIVE
Blood: NEGATIVE
Glucose,UR: NEGATIVE mg/dL (ref 0–75)
Leukocyte Esterase: NEGATIVE
Squamous Epithelial: 3

## 2012-03-13 LAB — LIPASE, BLOOD: Lipase: 37 U/L — ABNORMAL LOW (ref 73–393)

## 2012-03-13 NOTE — Telephone Encounter (Signed)
Would await to hear from hospital team.  She usually presents to [facility] which is closer for her.  Her issues are complex and include chronic constipation, primary hyperparathyroidism with hypercalcemia.

## 2012-03-13 NOTE — Telephone Encounter (Signed)
I can't find any record of hospital visit in the [facility] system; she could have gone to [facility] though. Any instructions? Thanks.

## 2012-03-14 ENCOUNTER — Ambulatory Visit: Payer: Medicare Other | Admitting: Internal Medicine

## 2012-03-14 ENCOUNTER — Inpatient Hospital Stay: Payer: Self-pay | Admitting: Surgery

## 2012-03-15 LAB — COMPREHENSIVE METABOLIC PANEL
Anion Gap: 7 (ref 7–16)
BUN: 7 mg/dL (ref 7–18)
Calcium, Total: 10 mg/dL (ref 8.5–10.1)
Chloride: 104 mmol/L (ref 98–107)
Co2: 29 mmol/L (ref 21–32)
EGFR (African American): >60
EGFR (Non-African Amer.): >60
Glucose: 81 mg/dL (ref 65–99)
Osmolality: 276 (ref 275–301)
Potassium: 3.6 mmol/L (ref 3.5–5.1)
Sodium: 140 mmol/L (ref 136–145)

## 2012-03-15 LAB — CBC WITH DIFFERENTIAL/PLATELET
Comment - H1-Com1: NORMAL
Eosinophil %: 2.3 %
MCH: 29.5 pg (ref 26.0–34.0)
MCHC: 35.1 g/dL (ref 32.0–36.0)
Monocytes: 5 %
Neutrophil #: 4.8 10*3/uL (ref 1.4–6.5)
Neutrophil %: 51.2 %
Platelet: 214 10*3/uL (ref 150–440)
RBC: 4.97 10*6/uL (ref 3.80–5.20)
RDW: 14.1 % (ref 11.5–14.5)
Variant Lymphocyte - H1-Rlymph: 1 %

## 2012-03-16 LAB — CBC WITH DIFFERENTIAL/PLATELET
Basophil #: 0 10*3/uL (ref 0.0–0.1)
Basophil %: 0.3 %
HCT: 42.2 % (ref 35.0–47.0)
HGB: 14.4 g/dL (ref 12.0–16.0)
Lymphocyte #: 3.7 10*3/uL — ABNORMAL HIGH (ref 1.0–3.6)
Lymphocyte %: 45.4 %
MCHC: 34.2 g/dL (ref 32.0–36.0)
MCV: 84 fL (ref 80–100)
Monocyte %: 6.3 %
Neutrophil #: 3.7 10*3/uL (ref 1.4–6.5)
RDW: 13.9 % (ref 11.5–14.5)
WBC: 8.1 10*3/uL (ref 3.6–11.0)

## 2012-03-16 LAB — BASIC METABOLIC PANEL
BUN: 6 mg/dL — ABNORMAL LOW (ref 7–18)
Creatinine: 0.67 mg/dL (ref 0.60–1.30)
Glucose: 104 mg/dL — ABNORMAL HIGH (ref 65–99)
Osmolality: 281 (ref 275–301)
Potassium: 3.6 mmol/L (ref 3.5–5.1)
Sodium: 142 mmol/L (ref 136–145)

## 2012-03-16 LAB — HEPATIC FUNCTION PANEL A (ARMC)
Bilirubin, Direct: 0.1 mg/dL (ref 0.00–0.20)
Bilirubin,Total: 0.4 mg/dL (ref 0.2–1.0)
Total Protein: 6.6 g/dL (ref 6.4–8.2)

## 2012-03-17 LAB — COMPREHENSIVE METABOLIC PANEL
Alkaline Phosphatase: 109 U/L (ref 50–136)
BUN: 3 mg/dL — ABNORMAL LOW (ref 7–18)
Bilirubin,Total: 0.6 mg/dL (ref 0.2–1.0)
Calcium, Total: 10 mg/dL (ref 8.5–10.1)
Co2: 29 mmol/L (ref 21–32)
EGFR (African American): >60
EGFR (Non-African Amer.): >60
Glucose: 81 mg/dL (ref 65–99)
Osmolality: 271 (ref 275–301)
SGOT(AST): 37 U/L (ref 15–37)
Sodium: 138 mmol/L (ref 136–145)

## 2012-03-18 LAB — BASIC METABOLIC PANEL
Anion Gap: 7 (ref 7–16)
BUN: 5 mg/dL — ABNORMAL LOW (ref 7–18)
Chloride: 106 mmol/L (ref 98–107)
Co2: 26 mmol/L (ref 21–32)
Creatinine: 0.65 mg/dL (ref 0.60–1.30)
EGFR (African American): >60
EGFR (Non-African Amer.): >60
Glucose: 86 mg/dL (ref 65–99)
Potassium: 4 mmol/L (ref 3.5–5.1)
Sodium: 139 mmol/L (ref 136–145)

## 2012-03-19 LAB — CBC WITH DIFFERENTIAL/PLATELET
Eosinophil %: 3.4 %
Lymphocyte #: 4.6 10*3/uL — ABNORMAL HIGH (ref 1.0–3.6)
MCH: 28.6 pg (ref 26.0–34.0)
MCHC: 34.1 g/dL (ref 32.0–36.0)
Monocyte %: 7.3 %
Neutrophil #: 5.1 10*3/uL (ref 1.4–6.5)
RBC: 5.57 10*6/uL — ABNORMAL HIGH (ref 3.80–5.20)
RDW: 14.1 % (ref 11.5–14.5)
WBC: 11 10*3/uL (ref 3.6–11.0)

## 2012-03-19 LAB — BASIC METABOLIC PANEL
Calcium, Total: 10.6 mg/dL — ABNORMAL HIGH (ref 8.5–10.1)
Chloride: 108 mmol/L — ABNORMAL HIGH (ref 98–107)
Co2: 25 mmol/L (ref 21–32)
EGFR (African American): >60
Potassium: 4.3 mmol/L (ref 3.5–5.1)
Sodium: 139 mmol/L (ref 136–145)

## 2012-03-26 ENCOUNTER — Inpatient Hospital Stay (HOSPITAL_COMMUNITY)
Admission: EM | Admit: 2012-03-26 | Discharge: 2012-03-31 | DRG: 389 | Disposition: A | Discharge status: Home / Self Care | Payer: Medicare Other | Attending: Internal Medicine | Admitting: Internal Medicine

## 2012-03-26 ENCOUNTER — Emergency Department (HOSPITAL_COMMUNITY): Payer: Medicare Other

## 2012-03-26 ENCOUNTER — Encounter (HOSPITAL_COMMUNITY): Payer: Self-pay

## 2012-03-26 DIAGNOSIS — G894 Chronic pain syndrome: Secondary | ICD-10-CM | POA: Diagnosis present

## 2012-03-26 DIAGNOSIS — Z87898 Personal history of other specified conditions: Secondary | ICD-10-CM

## 2012-03-26 DIAGNOSIS — K56600 Partial intestinal obstruction, unspecified as to cause: Secondary | ICD-10-CM

## 2012-03-26 DIAGNOSIS — E1165 Type 2 diabetes mellitus with hyperglycemia: Secondary | ICD-10-CM

## 2012-03-26 DIAGNOSIS — R140 Abdominal distension (gaseous): Secondary | ICD-10-CM

## 2012-03-26 DIAGNOSIS — K5900 Constipation, unspecified: Secondary | ICD-10-CM | POA: Diagnosis present

## 2012-03-26 DIAGNOSIS — F329 Major depressive disorder, single episode, unspecified: Secondary | ICD-10-CM | POA: Diagnosis present

## 2012-03-26 DIAGNOSIS — D649 Anemia, unspecified: Secondary | ICD-10-CM

## 2012-03-26 DIAGNOSIS — E669 Obesity, unspecified: Secondary | ICD-10-CM

## 2012-03-26 DIAGNOSIS — E210 Primary hyperparathyroidism: Secondary | ICD-10-CM | POA: Diagnosis present

## 2012-03-26 DIAGNOSIS — G9050 Complex regional pain syndrome I, unspecified: Secondary | ICD-10-CM | POA: Diagnosis present

## 2012-03-26 DIAGNOSIS — Z79899 Other long term (current) drug therapy: Secondary | ICD-10-CM

## 2012-03-26 DIAGNOSIS — F3289 Other specified depressive episodes: Secondary | ICD-10-CM | POA: Diagnosis present

## 2012-03-26 DIAGNOSIS — R110 Nausea: Secondary | ICD-10-CM

## 2012-03-26 DIAGNOSIS — R5383 Other fatigue: Secondary | ICD-10-CM

## 2012-03-26 DIAGNOSIS — K56609 Unspecified intestinal obstruction, unspecified as to partial versus complete obstruction: Principal | ICD-10-CM | POA: Diagnosis present

## 2012-03-26 DIAGNOSIS — E209 Hypoparathyroidism, unspecified: Secondary | ICD-10-CM | POA: Diagnosis present

## 2012-03-26 DIAGNOSIS — R209 Unspecified disturbances of skin sensation: Secondary | ICD-10-CM

## 2012-03-26 DIAGNOSIS — K219 Gastro-esophageal reflux disease without esophagitis: Secondary | ICD-10-CM

## 2012-03-26 DIAGNOSIS — J309 Allergic rhinitis, unspecified: Secondary | ICD-10-CM

## 2012-03-26 DIAGNOSIS — E119 Type 2 diabetes mellitus without complications: Secondary | ICD-10-CM | POA: Diagnosis present

## 2012-03-26 DIAGNOSIS — M5020 Other cervical disc displacement, unspecified cervical region: Secondary | ICD-10-CM

## 2012-03-26 DIAGNOSIS — N9489 Other specified conditions associated with female genital organs and menstrual cycle: Secondary | ICD-10-CM | POA: Diagnosis present

## 2012-03-26 DIAGNOSIS — Z01810 Encounter for preprocedural cardiovascular examination: Secondary | ICD-10-CM

## 2012-03-26 DIAGNOSIS — R1110 Vomiting, unspecified: Secondary | ICD-10-CM

## 2012-03-26 DIAGNOSIS — F17200 Nicotine dependence, unspecified, uncomplicated: Secondary | ICD-10-CM | POA: Diagnosis present

## 2012-03-26 DIAGNOSIS — K3184 Gastroparesis: Secondary | ICD-10-CM

## 2012-03-26 DIAGNOSIS — E8881 Metabolic syndrome: Secondary | ICD-10-CM

## 2012-03-26 DIAGNOSIS — I10 Essential (primary) hypertension: Secondary | ICD-10-CM

## 2012-03-26 DIAGNOSIS — G4700 Insomnia, unspecified: Secondary | ICD-10-CM

## 2012-03-26 DIAGNOSIS — R109 Unspecified abdominal pain: Secondary | ICD-10-CM

## 2012-03-26 DIAGNOSIS — S31109A Unspecified open wound of abdominal wall, unspecified quadrant without penetration into peritoneal cavity, initial encounter: Secondary | ICD-10-CM

## 2012-03-26 DIAGNOSIS — E785 Hyperlipidemia, unspecified: Secondary | ICD-10-CM

## 2012-03-26 LAB — URINE MICROSCOPIC-ADD ON

## 2012-03-26 LAB — URINALYSIS, ROUTINE W REFLEX MICROSCOPIC
Hgb urine dipstick: NEGATIVE
Ketones, ur: NEGATIVE mg/dL
Leukocytes, UA: NEGATIVE
Protein, ur: 30 mg/dL — AB
Urobilinogen, UA: 1 mg/dL (ref 0.0–1.0)

## 2012-03-26 LAB — CBC WITH DIFFERENTIAL/PLATELET
Basophils Absolute: 0 10*3/uL (ref 0.0–0.1)
HCT: 41.2 % (ref 36.0–46.0)
Lymphocytes Relative: 37 % (ref 12–46)
Lymphs Abs: 2.8 10*3/uL (ref 0.7–4.0)
Monocytes Absolute: 0.5 10*3/uL (ref 0.1–1.0)
Neutro Abs: 4.2 10*3/uL (ref 1.7–7.7)
RBC: 5.07 MIL/uL (ref 3.87–5.11)
RDW: 13.6 % (ref 11.5–15.5)
WBC: 7.8 10*3/uL (ref 4.0–10.5)

## 2012-03-26 LAB — COMPREHENSIVE METABOLIC PANEL
ALT: 20 U/L (ref 0–35)
AST: 21 U/L (ref 0–37)
CO2: 22 mEq/L (ref 19–32)
Chloride: 104 mEq/L (ref 96–112)
Creatinine, Ser: 0.58 mg/dL (ref 0.50–1.10)
GFR calc non Af Amer: >90 mL/min (ref >90)
Glucose, Bld: 106 mg/dL — ABNORMAL HIGH (ref 70–99)
Sodium: 137 mEq/L (ref 135–145)
Total Bilirubin: 0.3 mg/dL (ref 0.3–1.2)

## 2012-03-26 LAB — POCT PREGNANCY, URINE: Preg Test, Ur: NEGATIVE

## 2012-03-26 MED ORDER — SODIUM CHLORIDE 0.9 % IV BOLUS (SEPSIS)
1000.0000 mL | Freq: Once | INTRAVENOUS | Status: AC
  Administered 2012-03-26: 1000 mL via INTRAVENOUS

## 2012-03-26 MED ORDER — MORPHINE SULFATE 4 MG/ML IJ SOLN: 6.0000 mg | Freq: Once | INTRAMUSCULAR | Status: DC

## 2012-03-26 MED ORDER — ONDANSETRON HCL 4 MG/2ML IJ SOLN
4.0000 mg | Freq: Once | INTRAMUSCULAR | Status: AC
  Administered 2012-03-26: 4 mg via INTRAVENOUS
  Filled 2012-03-26: qty 2

## 2012-03-26 MED ORDER — MORPHINE SULFATE 4 MG/ML IJ SOLN
4.0000 mg | Freq: Once | INTRAMUSCULAR | Status: AC
  Administered 2012-03-26: 4 mg via INTRAVENOUS
  Filled 2012-03-26: qty 1

## 2012-03-26 MED ORDER — IOHEXOL 300 MG/ML  SOLN
20.0000 mL | INTRAMUSCULAR | Status: AC
  Administered 2012-03-26 (×2): 20 mL via ORAL

## 2012-03-26 MED ORDER — IOHEXOL 300 MG/ML  SOLN
100.0000 mL | Freq: Once | INTRAMUSCULAR | Status: AC | PRN
  Administered 2012-03-26: 100 mL via INTRAVENOUS

## 2012-03-26 MED ORDER — IOHEXOL 300 MG/ML  SOLN: 20.0000 mL | INTRAMUSCULAR | Status: AC

## 2012-03-26 NOTE — ED Notes (Signed)
Patient transported to X-ray 

## 2012-03-26 NOTE — ED Provider Notes (Signed)
History     CSN: [id number]  Arrival date & time 03/26/12  1324   First MD Initiated Contact with Patient 03/26/12 1823      Chief Complaint  Patient presents with  . Abdominal Pain    (Consider location/radiation/quality/duration/timing/severity/associated sxs/prior treatment) HPI The patient presents to the emergency department with diffuse abdominal pain.  The patient states that she has had bouts of this type of pain over the 6 months following a cholecystectomy, where she had a complication of perforation of her intestine during the operation.  Patient states that she was in hospital last week at [facility].  Patient denies fever, dysuria, chest pain, shortness of breath, weakness dysuria, back pain, or fatigue.  Patient states she called the surgeon, that she saw previously, and they refused to see her again, advised, her she will need to find another surgeon.  Patient states that she called his colleague, and advised to just take a laxative.  They state that the product of the emergency department tonight because of increased swelling and distention inHer abdomen.  Past Medical History  Diagnosis Date  . Diabetes mellitus   . Hypertension   . Tobacco abuse     Past Surgical History  Procedure Date  . Laparoscopic hysterectomy 2005  . Cesarean section 85, 87, 95  . Lumbar disc surgery     x2  . Knee surgery     Realignment surgery  . Hand surgery   . Ulnar nerve repair   . Rotator cuff repair   . Nerve stimulator in lumbar spine   . Cholecystectomy     With complications  . Myomectomy   . Abdominal surgery     Family History  Problem Relation Age of Onset  . Early death Father 23    GUNSHOT  . Kidney disease Brother     s/p transplant  . Heart disease Maternal Grandmother     Heart defect  . Prostate cancer Maternal Grandfather     History  Substance Use Topics  . Smoking status: Current Everyday Smoker -- 1.0 packs/day  . Smokeless  tobacco: Never Used  . Alcohol Use: Yes     Rarely    OB History    Grav Para Term Preterm Abortions TAB SAB Ect Mult Living                  Review of Systems All other systems negative except as documented in the HPI. All pertinent positives and negatives as reviewed in the HPI.  Allergies  Gadolinium derivatives; Aspirin; Ibuprofen; and Nsaids  Home Medications   Current Outpatient Rx  Name Route Sig Dispense Refill  . CYCLOBENZAPRINE HCL 10 MG PO TABS Oral Take 20 mg by mouth at bedtime.    Marland Kitchen ESOMEPRAZOLE MAGNESIUM 40 MG PO CPDR Oral Take 40 mg by mouth daily.    Marland Kitchen LISINOPRIL 20 MG PO TABS Oral Take 20 mg by mouth daily.    Marland Kitchen METOPROLOL SUCCINATE ER 100 MG PO TB24 Oral Take 100 mg by mouth daily. Take with or immediately following a meal.    . MORPHINE SULFATE ER 80 MG PO CP24 Oral Take 80 mg by mouth 2 (two) times daily.    . MORPHINE SULFATE 30 MG PO TABS Oral Take 30 mg by mouth every 6 (six) hours as needed. For pain    . VITAMIN D (ERGOCALCIFEROL) 50000 UNITS PO CAPS Oral Take 50,000 Units by mouth 2 (two) times a week. Monday & Thursdays    .  GLUCOSE BLOOD VI STRP  Test twice daily DX 250.02 100 each 3  . FREESTYLE LANCETS MISC  Use as instructed Twice daily DX: 250.02 100 each 12    BP 140/98  Pulse 67  Temp 98.5 F (36.9 C) (Oral)  Resp 16  SpO2 97%  Physical Exam  Constitutional: She appears well-developed and well-nourished.  HENT:  Head: Normocephalic and atraumatic.  Mouth/Throat: Oropharynx is clear and moist.  Cardiovascular: Normal rate, regular rhythm and normal heart sounds.  Exam reveals no gallop and no friction rub.   No murmur heard. Pulmonary/Chest: Effort normal and breath sounds normal. No respiratory distress. She has no wheezes.  Abdominal: Soft. Bowel sounds are normal. She exhibits distension. There is generalized tenderness. There is no rigidity, no rebound and no guarding.      ED Course  Procedures (including critical care  time)  Labs Reviewed  COMPREHENSIVE METABOLIC PANEL - Abnormal; Notable for the following:    Glucose, Bld 106 (*)     Calcium 10.8 (*)     All other components within normal limits  URINALYSIS, ROUTINE W REFLEX MICROSCOPIC - Abnormal; Notable for the following:    APPearance CLOUDY (*)     Bilirubin Urine SMALL (*)     Protein, ur 30 (*)     All other components within normal limits  URINE MICROSCOPIC-ADD ON - Abnormal; Notable for the following:    Squamous Epithelial / LPF MANY (*)     Bacteria, UA FEW (*)     All other components within normal limits  CBC WITH DIFFERENTIAL  POCT PREGNANCY, URINE   Ct Abdomen Pelvis W Contrast  03/26/2012  *RADIOLOGY REPORT*  Clinical Data: Abdominal pain.  Nausea and vomiting.  Constipation. The operative history including hysterectomy, cholecystectomy and cesarean section.  CT ABDOMEN AND PELVIS WITH CONTRAST  Technique:  Multidetector CT imaging of the abdomen and pelvis was performed following the standard protocol during bolus administration of intravenous contrast.  Contrast: OMNIPAQUE IOHEXOL 300 MG/ML  SOLN  Comparison: 05/01/2010.  Findings: Marked dependent atelectasis is present at the lung bases.  Lingular atelectasis or scarring.  Heart grossly appears normal.  Liver appears normal.  Cholecystectomy.  Mild atrophy of the pancreas.  Spleen normal.  Adrenal glands normal.  Normal renal enhancement and delayed excretion of contrast. Relative right renal atrophy.  Spinal stimulator noted in the right posterior flank soft tissues.  Small bowel an anastomosis in the right lower quadrant. There is decompressed small bowel adjacent to the anastomoses with mild dilation and fluid-filled small bowel proximally and partial small bowel obstruction although low grade may be present.  Tiny amount of free fluid in the pouch of [place].  Large stool burden is present in the sigmoid colon.  Liquid stool is present in the ascending colon, transverse and  descending colon which is abnormal but nonspecific.  This may be related to enteric infection or obstipation in this patient with a prominent distal stool burden.  The urinary bladder appears within normal limits.  In the left adnexal region, there is an enlarging solid and cystic lesion measuring 53 mm x 87 mm. Craniocaudal extent is 47 mm.  This demonstrates a thickened rind of anterior soft tissue and debris evident thick central septation, greater than expected for complex ovarian cyst and suspicious for ovarian neoplasm.  There is no inguinal adenopathy.  No iliac adenopathy is identified.  Right ovary appears within normal limits. Hysterectomy.  Delayed renal excretion of contrast appears normal. L4-L5  L5-S1 lumbar spondylosis.  Midline abdominal scar in the periumbilical region. Normal appendix identified in the right lower quadrant.  IMPRESSION: 1.  Liquid stool present in the colon with a large distal colonic stool burden.  Although nonspecific, these findings may be associated with enteric infection or obstipation. 2.  Right lower quadrant small bowel anastomosis with decompressed distal small bowel and mild dilation of small bowel proximally suggesting low grade partial small bowel obstruction. 3.  Left adnexal complex cystic mass measuring 53 mm x 87 mm x 47 mm.  6-week follow-up pelvic ultrasound recommended to assess for resolution.  If the mass has not resolved, surgical consultation recommended.  Original Report Authenticated By: Andreas Newport, M.D.   Dg Abd Acute W/chest  03/26/2012  *RADIOLOGY REPORT*  Clinical Data: 46 year old female with abdominal pain nausea vomiting.  Cholecystectomy in April.  ACUTE ABDOMEN SERIES (ABDOMEN 2 VIEW & CHEST 1 VIEW)  Comparison: 09/19/2006.  Findings: New electrode leads extending from the abdomen into the neck.  Lower lung volumes.  Chronic increased interstitial markings at the lung bases have not significantly changed.  No pneumothorax or pneumoperitoneum.   Cardiac size at the upper limits of normal. Other mediastinal contours are within normal limits.  No pleural effusion.  Generator device projects over the right lower quadrant.  Evidence of retained stool in the colon.  Bowel gas in nondilated distal colon.  Occasional small bowel gas, no dilated loops are evident. No acute osseous abnormality identified.  IMPRESSION: 1. Nonobstructed bowel gas pattern, no free air. 2.  Placement of head or neck stimulator type device since 2008.  Original Report Authenticated By: Harley Hallmark, M.D.    The patient in the CDU, awaiting the rest of her testing results.    MDM  MDM Reviewed: vitals and nursing note Interpretation: labs, CT scan and x-ray            Carlyle Dolly, PA-C 03/27/12 [phone]

## 2012-03-26 NOTE — ED Notes (Signed)
Pt. C/o of severe abdominal pain (8/10) unrelieved by PO morphine 90mg . Pt. Reports having gall bladder sx in April and stated surgeon knicked her intestines causing scar tissue to grow. Pt was dx with intestinal obstruction last week @ [facility]. Provider told her to report to ED if pain increased. Pt.s abdomen is distended and hypoactive bowel sounds noted.

## 2012-03-26 NOTE — ED Notes (Signed)
Pt staes that her pain is increasing and se is feeling worse.

## 2012-03-26 NOTE — ED Notes (Signed)
Py reports generalized abd pain starting in April, pt reports having her gall bladder removed in April, the surgeon "nicked" her intestines causing a blockage. Pt reports they dx her at [facility] last week w/a blockage. Pt reports removing 5 in of her SI in April.

## 2012-03-26 NOTE — ED Notes (Signed)
Informed patient that we need a urine for testing. Pt is to notified staff when she has an urge to void

## 2012-03-26 NOTE — ED Notes (Signed)
Pt updated on care 

## 2012-03-26 NOTE — ED Notes (Signed)
Pt asked to provide urine sample, unable at this time

## 2012-03-26 NOTE — ED Notes (Signed)
Patient rates pain as 6/10. Appears calm and drowsy. Waiting for CT abdomen. Family at bedside. Will continue to monitor patient.

## 2012-03-27 ENCOUNTER — Encounter (HOSPITAL_COMMUNITY): Payer: Self-pay | Admitting: *Deleted

## 2012-03-27 ENCOUNTER — Inpatient Hospital Stay (HOSPITAL_COMMUNITY): Payer: Medicare Other

## 2012-03-27 DIAGNOSIS — K56609 Unspecified intestinal obstruction, unspecified as to partial versus complete obstruction: Principal | ICD-10-CM | POA: Diagnosis present

## 2012-03-27 DIAGNOSIS — R109 Unspecified abdominal pain: Secondary | ICD-10-CM

## 2012-03-27 DIAGNOSIS — R141 Gas pain: Secondary | ICD-10-CM

## 2012-03-27 DIAGNOSIS — E209 Hypoparathyroidism, unspecified: Secondary | ICD-10-CM | POA: Diagnosis present

## 2012-03-27 LAB — GLUCOSE, CAPILLARY: Glucose-Capillary: 84 mg/dL (ref 70–99)

## 2012-03-27 MED ORDER — MORPHINE SULFATE 4 MG/ML IJ SOLN
4.0000 mg | INTRAMUSCULAR | Status: DC | PRN
  Administered 2012-03-27 – 2012-03-28 (×4): 4 mg via INTRAVENOUS
  Filled 2012-03-27 (×4): qty 1

## 2012-03-27 MED ORDER — ENOXAPARIN SODIUM 40 MG/0.4ML ~~LOC~~ SOLN
40.0000 mg | Freq: Every day | SUBCUTANEOUS | Status: DC
  Administered 2012-03-27 – 2012-03-31 (×5): 40 mg via SUBCUTANEOUS
  Filled 2012-03-27 (×5): qty 0.4

## 2012-03-27 MED ORDER — VITAMIN D (ERGOCALCIFEROL) 1.25 MG (50000 UNIT) PO CAPS
50000.0000 [IU] | ORAL_CAPSULE | ORAL | Status: DC
  Filled 2012-03-27 (×2): qty 1

## 2012-03-27 MED ORDER — MORPHINE SULFATE ER 30 MG PO TBCR
60.0000 mg | EXTENDED_RELEASE_TABLET | Freq: Three times a day (TID) | ORAL | Status: DC
  Administered 2012-03-27: 60 mg via ORAL
  Filled 2012-03-27: qty 2

## 2012-03-27 MED ORDER — METOPROLOL TARTRATE 1 MG/ML IV SOLN
5.0000 mg | Freq: Four times a day (QID) | INTRAVENOUS | Status: DC
  Administered 2012-03-27 – 2012-03-29 (×8): 5 mg via INTRAVENOUS
  Filled 2012-03-27 (×13): qty 5

## 2012-03-27 MED ORDER — ZOLPIDEM TARTRATE 5 MG PO TABS: 5.0000 mg | ORAL_TABLET | Freq: Every evening | ORAL | Status: DC | PRN

## 2012-03-27 MED ORDER — HYDRALAZINE HCL 20 MG/ML IJ SOLN
10.0000 mg | Freq: Four times a day (QID) | INTRAMUSCULAR | Status: DC | PRN
  Filled 2012-03-27: qty 0.5

## 2012-03-27 MED ORDER — HYDROMORPHONE HCL PF 1 MG/ML IJ SOLN: 1.0000 mg | INTRAMUSCULAR | Status: DC | PRN

## 2012-03-27 MED ORDER — DEXTROSE-NACL 5-0.45 % IV SOLN
INTRAVENOUS | Status: DC
  Administered 2012-03-27 – 2012-03-29 (×6): via INTRAVENOUS

## 2012-03-27 MED ORDER — PHENOL 1.4 % MT LIQD
1.0000 | OROMUCOSAL | Status: DC | PRN
  Filled 2012-03-27: qty 177

## 2012-03-27 MED ORDER — MORPHINE SULFATE ER 30 MG PO TBCR
45.0000 mg | EXTENDED_RELEASE_TABLET | Freq: Two times a day (BID) | ORAL | Status: DC
  Filled 2012-03-27: qty 1

## 2012-03-27 MED ORDER — CIPROFLOXACIN IN D5W 400 MG/200ML IV SOLN
400.0000 mg | Freq: Two times a day (BID) | INTRAVENOUS | Status: DC
  Administered 2012-03-27: 400 mg via INTRAVENOUS
  Filled 2012-03-27 (×3): qty 200

## 2012-03-27 MED ORDER — ACETAMINOPHEN 325 MG PO TABS
650.0000 mg | ORAL_TABLET | Freq: Four times a day (QID) | ORAL | Status: DC | PRN
  Administered 2012-03-27: 650 mg via ORAL
  Filled 2012-03-27: qty 2

## 2012-03-27 MED ORDER — FENTANYL 50 MCG/HR TD PT72
50.0000 ug | MEDICATED_PATCH | TRANSDERMAL | Status: DC
  Administered 2012-03-27: 50 ug via TRANSDERMAL
  Filled 2012-03-27: qty 1

## 2012-03-27 MED ORDER — METRONIDAZOLE IN NACL 5-0.79 MG/ML-% IV SOLN
500.0000 mg | Freq: Three times a day (TID) | INTRAVENOUS | Status: DC
  Administered 2012-03-27 (×2): 500 mg via INTRAVENOUS
  Filled 2012-03-27 (×3): qty 100

## 2012-03-27 MED ORDER — CYCLOBENZAPRINE HCL 10 MG PO TABS: 20.0000 mg | ORAL_TABLET | Freq: Every day | ORAL | Status: DC

## 2012-03-27 MED ORDER — ONDANSETRON HCL 4 MG/2ML IJ SOLN
4.0000 mg | Freq: Four times a day (QID) | INTRAMUSCULAR | Status: DC | PRN
  Filled 2012-03-27: qty 2

## 2012-03-27 MED ORDER — METOPROLOL SUCCINATE ER 100 MG PO TB24
100.0000 mg | ORAL_TABLET | Freq: Every day | ORAL | Status: DC
  Filled 2012-03-27: qty 1

## 2012-03-27 MED ORDER — MORPHINE SULFATE ER 80 MG PO CP24: 80.0000 mg | ORAL_CAPSULE | Freq: Two times a day (BID) | ORAL | Status: DC

## 2012-03-27 MED ORDER — ONDANSETRON HCL 4 MG PO TABS: 4.0000 mg | ORAL_TABLET | Freq: Four times a day (QID) | ORAL | Status: DC | PRN

## 2012-03-27 MED ORDER — LIDOCAINE HCL 2 % EX GEL
Freq: Once | CUTANEOUS | Status: DC
  Filled 2012-03-27: qty 5

## 2012-03-27 MED ORDER — PANTOPRAZOLE SODIUM 40 MG IV SOLR
40.0000 mg | INTRAVENOUS | Status: DC
  Administered 2012-03-27 – 2012-03-28 (×2): 40 mg via INTRAVENOUS
  Filled 2012-03-27 (×3): qty 40

## 2012-03-27 NOTE — ED Provider Notes (Signed)
Pt with diffuse abdominal pain, distention, vomiting, onset two days ago. Hx of the same, was hospitalized at [facility] 2 weeks ago. Discharged home after diagnosed with Partial SBO. No vomiting at this time. States symptoms worsened. Pt awaiting CT abd/pelvis. Pt has no current complaints. Symptoms improved with pain and nausea medications.   Results for orders placed during the hospital encounter of 03/26/12  CBC WITH DIFFERENTIAL      Component Value Range   WBC 7.8  4.0 - 10.5 K/uL   RBC 5.07  3.87 - 5.11 MIL/uL   Hemoglobin 14.7  12.0 - 15.0 g/dL   HCT 11.9  14.7 - 82.9 %   MCV 81.3  78.0 - 100.0 fL   MCH 29.0  26.0 - 34.0 pg   MCHC 35.7  30.0 - 36.0 g/dL   RDW 56.2  13.0 - 86.5 %   Platelets 271  150 - 400 K/uL   Neutrophils Relative 54  43 - 77 %   Neutro Abs 4.2  1.7 - 7.7 K/uL   Lymphocytes Relative 37  12 - 46 %   Lymphs Abs 2.8  0.7 - 4.0 K/uL   Monocytes Relative 7  3 - 12 %   Monocytes Absolute 0.5  0.1 - 1.0 K/uL   Eosinophils Relative 3  0 - 5 %   Eosinophils Absolute 0.2  0.0 - 0.7 K/uL   Basophils Relative 0  0 - 1 %   Basophils Absolute 0.0  0.0 - 0.1 K/uL  COMPREHENSIVE METABOLIC PANEL      Component Value Range   Sodium 137  135 - 145 mEq/L   Potassium 4.3  3.5 - 5.1 mEq/L   Chloride 104  96 - 112 mEq/L   CO2 22  19 - 32 mEq/L   Glucose, Bld 106 (*) 70 - 99 mg/dL   BUN 9  6 - 23 mg/dL   Creatinine, Ser 7.84  0.50 - 1.10 mg/dL   Calcium 69.6 (*) 8.4 - 10.5 mg/dL   Total Protein 7.1  6.0 - 8.3 g/dL   Albumin 3.7  3.5 - 5.2 g/dL   AST 21  0 - 37 U/L   ALT 20  0 - 35 U/L   Alkaline Phosphatase 102  39 - 117 U/L   Total Bilirubin 0.3  0.3 - 1.2 mg/dL   GFR calc non Af Amer >90  >90 mL/min   GFR calc Af Amer >90  >90 mL/min  URINALYSIS, ROUTINE W REFLEX MICROSCOPIC      Component Value Range   Color, Urine YELLOW  YELLOW   APPearance CLOUDY (*) CLEAR   Specific Gravity, Urine 1.026  1.005 - 1.030   pH 5.0  5.0 - 8.0   Glucose, UA NEGATIVE   NEGATIVE mg/dL   Hgb urine dipstick NEGATIVE  NEGATIVE   Bilirubin Urine SMALL (*) NEGATIVE   Ketones, ur NEGATIVE  NEGATIVE mg/dL   Protein, ur 30 (*) NEGATIVE mg/dL   Urobilinogen, UA 1.0  0.0 - 1.0 mg/dL   Nitrite NEGATIVE  NEGATIVE   Leukocytes, UA NEGATIVE  NEGATIVE  POCT PREGNANCY, URINE      Component Value Range   Preg Test, Ur NEGATIVE  NEGATIVE  URINE MICROSCOPIC-ADD ON      Component Value Range   Squamous Epithelial / LPF MANY (*) RARE   WBC, UA 3-6  <3 WBC/hpf   RBC / HPF 0-2  <3 RBC/hpf   Bacteria, UA FEW (*) RARE   Urine-Other MUCOUS  PRESENT     Ct Abdomen Pelvis W Contrast  03/26/2012  *RADIOLOGY REPORT*  Clinical Data: Abdominal pain.  Nausea and vomiting.  Constipation. The operative history including hysterectomy, cholecystectomy and cesarean section.  CT ABDOMEN AND PELVIS WITH CONTRAST  Technique:  Multidetector CT imaging of the abdomen and pelvis was performed following the standard protocol during bolus administration of intravenous contrast.  Contrast: OMNIPAQUE IOHEXOL 300 MG/ML  SOLN  Comparison: 05/01/2010.  Findings: Marked dependent atelectasis is present at the lung bases.  Lingular atelectasis or scarring.  Heart grossly appears normal.  Liver appears normal.  Cholecystectomy.  Mild atrophy of the pancreas.  Spleen normal.  Adrenal glands normal.  Normal renal enhancement and delayed excretion of contrast. Relative right renal atrophy.  Spinal stimulator noted in the right posterior flank soft tissues.  Small bowel an anastomosis in the right lower quadrant. There is decompressed small bowel adjacent to the anastomoses with mild dilation and fluid-filled small bowel proximally and partial small bowel obstruction although low grade may be present.  Tiny amount of free fluid in the pouch of [place].  Large stool burden is present in the sigmoid colon.  Liquid stool is present in the ascending colon, transverse and descending colon which is abnormal but  nonspecific.  This may be related to enteric infection or obstipation in this patient with a prominent distal stool burden.  The urinary bladder appears within normal limits.  In the left adnexal region, there is an enlarging solid and cystic lesion measuring 53 mm x 87 mm. Craniocaudal extent is 47 mm.  This demonstrates a thickened rind of anterior soft tissue and debris evident thick central septation, greater than expected for complex ovarian cyst and suspicious for ovarian neoplasm.  There is no inguinal adenopathy.  No iliac adenopathy is identified.  Right ovary appears within normal limits. Hysterectomy.  Delayed renal excretion of contrast appears normal. L4-L5 L5-S1 lumbar spondylosis.  Midline abdominal scar in the periumbilical region. Normal appendix identified in the right lower quadrant.  IMPRESSION: 1.  Liquid stool present in the colon with a large distal colonic stool burden.  Although nonspecific, these findings may be associated with enteric infection or obstipation. 2.  Right lower quadrant small bowel anastomosis with decompressed distal small bowel and mild dilation of small bowel proximally suggesting low grade partial small bowel obstruction. 3.  Left adnexal complex cystic mass measuring 53 mm x 87 mm x 47 mm.  6-week follow-up pelvic ultrasound recommended to assess for resolution.  If the mass has not resolved, surgical consultation recommended.  Original Report Authenticated By: Andreas Newport, M.D.   12:25 AM CT positive for a partial SBO. I spoke with triad. Pt will be admitted for further evaluation and work up. Pt not vomiting. No NG tube ordered at this time.   Lottie Mussel, [place] 03/27/12 [phone]

## 2012-03-27 NOTE — Consult Note (Signed)
Reason for Consult Abdominal pain with Hx of N/V Referring Physician: Dr. Illene Labrador is an 46 y.o. female.  HPI: who underwent cholecystectomy in April of this year at [facility]. hospital complicated by small bowel injury necessitating repair according to the patient and multiple admissions after that for small bowel obstruction ( x7 including today's admission per patient)  She presented to [facility] last evening with 2 days history of abdominal pain nausea and vomiting; yesterday the pain was around 10 over 10 and last evening went down to 4/10. Patient stated that she cannot keep most things down. Patient has a history of chronic pain in her left hand status post stimulator placement put a crushing injury. She is on chronic pain medications, she has a lifelong history of constipation with a normal pattern of BM every 2-3 days. Last BM per patient was last night. She states that prior to her lap chole and associated complications that she did not have these frequent bouts of SBO.  She also states that after these bouts of N/V that her umbilical area has a burning sensation for several days after words, but that there is no change in the size of her hernia, nor is there any erythema or bruising present. Previous abdominal surgeries include several C-Sections, Lap chole and f/u open exploration, Total Hysterectomy secondary to fibroids. CT report: IMPRESSION:  1. Liquid stool present in the colon with a large distal colonic  stool burden. Although nonspecific, these findings may be  associated with enteric infection or obstipation.  2. Right lower quadrant small bowel anastomosis with decompressed  distal small bowel and mild dilation of small bowel proximally  suggesting low grade partial small bowel obstruction.  3. Left adnexal complex cystic mass measuring 53 mm x 87 mm x 47  mm. 6-week follow-up pelvic ultrasound recommended to assess for  resolution. If the mass has not resolved,  surgical consultation  recommended.  We are asked to see her on consult for further evaluation of her c/o and recommendations.  Past Medical History  Diagnosis Date  . Diabetes mellitus   . Hypertension   . Tobacco abuse     Past Surgical History  Procedure Date  . Laparoscopic hysterectomy 2005  . Cesarean section 85, 87, 95  . Lumbar disc surgery     x2  . Knee surgery     Realignment surgery  . Hand surgery   . Ulnar nerve repair   . Rotator cuff repair   . Nerve stimulator in lumbar spine   . Cholecystectomy     With complications  . Myomectomy   . Abdominal surgery     Family History  Problem Relation Age of Onset  . Early death Father 49    GUNSHOT  . Kidney disease Brother     s/p transplant  . Heart disease Maternal Grandmother     Heart defect  . Prostate cancer Maternal Grandfather     Social History:  reports that she has been smoking.  She has never used smokeless tobacco. She reports that she drinks alcohol. She reports that she does not use illicit drugs.  Allergies:  Allergies  Allergen Reactions  . Gadolinium Derivatives     Pt states nausea and vomiting to GAD in MRI pt states she does ok with iv/cm in cat scan  . Aspirin     REACTION: Tongue, throat, extremities swell  . Ibuprofen     REACTION: Tongue, throat, extremities swell  . Latex  Hives  . Nsaids     REACTION: Tongue, throat, extremities swell    Medications: I have reviewed the patient's current medications.  Results for orders placed during the hospital encounter of 03/26/12 (from the past 48 hour(s))  CBC WITH DIFFERENTIAL     Status: Normal   Collection Time   03/26/12  2:01 PM      Component Value Range Comment   WBC 7.8  4.0 - 10.5 K/uL    RBC 5.07  3.87 - 5.11 MIL/uL    Hemoglobin 14.7  12.0 - 15.0 g/dL    HCT 98.1  19.1 - 47.8 %    MCV 81.3  78.0 - 100.0 fL    MCH 29.0  26.0 - 34.0 pg    MCHC 35.7  30.0 - 36.0 g/dL    RDW 29.5  62.1 - 30.8 %    Platelets 271  150 -  400 K/uL    Neutrophils Relative 54  43 - 77 %    Neutro Abs 4.2  1.7 - 7.7 K/uL    Lymphocytes Relative 37  12 - 46 %    Lymphs Abs 2.8  0.7 - 4.0 K/uL    Monocytes Relative 7  3 - 12 %    Monocytes Absolute 0.5  0.1 - 1.0 K/uL    Eosinophils Relative 3  0 - 5 %    Eosinophils Absolute 0.2  0.0 - 0.7 K/uL    Basophils Relative 0  0 - 1 %    Basophils Absolute 0.0  0.0 - 0.1 K/uL   COMPREHENSIVE METABOLIC PANEL     Status: Abnormal   Collection Time   03/26/12  2:01 PM      Component Value Range Comment   Sodium 137  135 - 145 mEq/L    Potassium 4.3  3.5 - 5.1 mEq/L    Chloride 104  96 - 112 mEq/L    CO2 22  19 - 32 mEq/L    Glucose, Bld 106 (*) 70 - 99 mg/dL    BUN 9  6 - 23 mg/dL    Creatinine, Ser 6.57  0.50 - 1.10 mg/dL    Calcium 84.6 (*) 8.4 - 10.5 mg/dL    Total Protein 7.1  6.0 - 8.3 g/dL    Albumin 3.7  3.5 - 5.2 g/dL    AST 21  0 - 37 U/L    ALT 20  0 - 35 U/L    Alkaline Phosphatase 102  39 - 117 U/L    Total Bilirubin 0.3  0.3 - 1.2 mg/dL    GFR calc non Af Amer >90  >90 mL/min    GFR calc Af Amer >90  >90 mL/min   URINALYSIS, ROUTINE W REFLEX MICROSCOPIC     Status: Abnormal   Collection Time   03/26/12  8:55 PM      Component Value Range Comment   Color, Urine YELLOW  YELLOW    APPearance CLOUDY (*) CLEAR    Specific Gravity, Urine 1.026  1.005 - 1.030    pH 5.0  5.0 - 8.0    Glucose, UA NEGATIVE  NEGATIVE mg/dL    Hgb urine dipstick NEGATIVE  NEGATIVE    Bilirubin Urine SMALL (*) NEGATIVE    Ketones, ur NEGATIVE  NEGATIVE mg/dL    Protein, ur 30 (*) NEGATIVE mg/dL    Urobilinogen, UA 1.0  0.0 - 1.0 mg/dL    Nitrite NEGATIVE  NEGATIVE    Leukocytes, UA NEGATIVE  NEGATIVE   URINE MICROSCOPIC-ADD ON     Status: Abnormal   Collection Time   03/26/12  8:55 PM      Component Value Range Comment   Squamous Epithelial / LPF MANY (*) RARE    WBC, UA 3-6  <3 WBC/hpf    RBC / HPF 0-2  <3 RBC/hpf    Bacteria, UA FEW (*) RARE    Urine-Other MUCOUS PRESENT       POCT PREGNANCY, URINE     Status: Normal   Collection Time   03/26/12  8:59 PM      Component Value Range Comment   Preg Test, Ur NEGATIVE  NEGATIVE   GLUCOSE, CAPILLARY     Status: Normal   Collection Time   03/27/12  3:06 AM      Component Value Range Comment   Glucose-Capillary 84  70 - 99 mg/dL    Comment 1 Documented in Chart      Comment 2 Notify RN       Dg Abd 1 View  03/27/2012  *RADIOLOGY REPORT*  Clinical Data: The small bowel obstruction.  Abdominal distension and pain.  ABDOMEN - 1 VIEW  Comparison: Acute abdominal series 03/26/2012.  Findings: There is some residual oral contrast material is scattered throughout the bowel, predominantly within the colon at this time, related to recent CT of the abdomen and pelvis 03/26/2012.  There is a paucity of distal colonic or rectal gas or stool (portions of the rectum are obscured by the urinary bladder which is filled with excrete contrast material).  No definite pathologic distension of small bowel is noted on today's examination.  No gross evidence of pneumoperitoneum on this single supine view.  An electronic device projects over the right side of the abdomen with leads extending cephalad, likely representing a spinal cord stimulator.  Surgical clips project over the right upper quadrant of the abdomen, consistent with prior cholecystectomy.  IMPRESSION: 1.  Nonspecific, nonobstructive bowel gas pattern. 2.  Additional incidental findings, as above.  Original Report Authenticated By: Florencia Reasons, M.D.   Ct Abdomen Pelvis W Contrast  03/26/2012  *RADIOLOGY REPORT*  Clinical Data: Abdominal pain.  Nausea and vomiting.  Constipation. The operative history including hysterectomy, cholecystectomy and cesarean section.  CT ABDOMEN AND PELVIS WITH CONTRAST  Technique:  Multidetector CT imaging of the abdomen and pelvis was performed following the standard protocol during bolus administration of intravenous contrast.  Contrast:  OMNIPAQUE IOHEXOL 300 MG/ML  SOLN  Comparison: 05/01/2010.  Findings: Marked dependent atelectasis is present at the lung bases.  Lingular atelectasis or scarring.  Heart grossly appears normal.  Liver appears normal.  Cholecystectomy.  Mild atrophy of the pancreas.  Spleen normal.  Adrenal glands normal.  Normal renal enhancement and delayed excretion of contrast. Relative right renal atrophy.  Spinal stimulator noted in the right posterior flank soft tissues.  Small bowel an anastomosis in the right lower quadrant. There is decompressed small bowel adjacent to the anastomoses with mild dilation and fluid-filled small bowel proximally and partial small bowel obstruction although low grade may be present.  Tiny amount of free fluid in the pouch of [place].  Large stool burden is present in the sigmoid colon.  Liquid stool is present in the ascending colon, transverse and descending colon which is abnormal but nonspecific.  This may be related to enteric infection or obstipation in this patient with a prominent distal stool burden.  The urinary bladder appears within normal limits.  In the left adnexal region, there is an enlarging solid and cystic lesion measuring 53 mm x 87 mm. Craniocaudal extent is 47 mm.  This demonstrates a thickened rind of anterior soft tissue and debris evident thick central septation, greater than expected for complex ovarian cyst and suspicious for ovarian neoplasm.  There is no inguinal adenopathy.  No iliac adenopathy is identified.  Right ovary appears within normal limits. Hysterectomy.  Delayed renal excretion of contrast appears normal. L4-L5 L5-S1 lumbar spondylosis.  Midline abdominal scar in the periumbilical region. Normal appendix identified in the right lower quadrant.  IMPRESSION: 1.  Liquid stool present in the colon with a large distal colonic stool burden.  Although nonspecific, these findings may be associated with enteric infection or obstipation. 2.  Right lower quadrant  small bowel anastomosis with decompressed distal small bowel and mild dilation of small bowel proximally suggesting low grade partial small bowel obstruction. 3.  Left adnexal complex cystic mass measuring 53 mm x 87 mm x 47 mm.  6-week follow-up pelvic ultrasound recommended to assess for resolution.  If the mass has not resolved, surgical consultation recommended.  Original Report Authenticated By: Andreas Newport, M.D.   Dg Abd Acute W/chest  03/26/2012  *RADIOLOGY REPORT*  Clinical Data: 46 year old female with abdominal pain nausea vomiting.  Cholecystectomy in April.  ACUTE ABDOMEN SERIES (ABDOMEN 2 VIEW & CHEST 1 VIEW)  Comparison: 09/19/2006.  Findings: New electrode leads extending from the abdomen into the neck.  Lower lung volumes.  Chronic increased interstitial markings at the lung bases have not significantly changed.  No pneumothorax or pneumoperitoneum.  Cardiac size at the upper limits of normal. Other mediastinal contours are within normal limits.  No pleural effusion.  Generator device projects over the right lower quadrant.  Evidence of retained stool in the colon.  Bowel gas in nondilated distal colon.  Occasional small bowel gas, no dilated loops are evident. No acute osseous abnormality identified.  IMPRESSION: 1. Nonobstructed bowel gas pattern, no free air. 2.  Placement of head or neck stimulator type device since 2008.  Original Report Authenticated By: Harley Hallmark, M.D.    Review of Systems  Constitutional: Negative.   HENT: Negative.  Negative for neck pain.   Eyes: Negative.   Respiratory: Negative.   Cardiovascular: Negative.   Gastrointestinal: Positive for nausea, vomiting, abdominal pain and constipation. Negative for heartburn, diarrhea, blood in stool and melena.  Genitourinary: Negative.   Musculoskeletal: Positive for myalgias and back pain. Negative for joint pain and falls.  Skin: Negative.   Neurological: Negative.   Endo/Heme/Allergies: Negative.     Psychiatric/Behavioral: Negative.    Blood pressure 144/95, pulse 80, temperature 98 F (36.7 C), temperature source Oral, resp. rate 16, height 5\' 7"  (1.702 m), weight 220 lb 0.3 oz (99.8 kg), SpO2 97.00%. Physical Exam  Constitutional: She is oriented to person, place, and time. She appears well-developed and well-nourished. No distress.  HENT:  Head: Normocephalic and atraumatic.  Mouth/Throat: No oropharyngeal exudate.  Eyes: Conjunctivae are normal. Pupils are equal, round, and reactive to light. Right eye exhibits no discharge. Left eye exhibits no discharge. No scleral icterus.  Neck: Normal range of motion. Neck supple. No JVD present. No tracheal deviation present. No thyromegaly present.  Cardiovascular: Normal rate, regular rhythm and intact distal pulses.  Exam reveals no gallop and no friction rub.   No murmur heard. Respiratory: Effort normal and breath sounds normal. No stridor. No respiratory distress. She has no wheezes. She has  no rales. She exhibits no tenderness.  GI: Soft. Bowel sounds are normal. She exhibits distension. She exhibits no mass. There is no hepatosplenomegaly, splenomegaly or hepatomegaly. There is generalized tenderness. There is no rigidity, no rebound, no guarding, no tenderness at McBurney's point and negative Murphy's sign. A hernia is present. Hernia confirmed positive in the ventral area.    Musculoskeletal: Normal range of motion. She exhibits no edema and no tenderness.  Lymphadenopathy:    She has no cervical adenopathy.  Neurological: She is alert and oriented to person, place, and time.  Skin: Skin is warm and dry. No rash noted. She is not diaphoretic. No erythema. No pallor.  Psychiatric: Her speech is normal and behavior is normal. Judgment and thought content normal. Cognition and memory are normal. She exhibits a depressed mood.    Assessment/Plan: 1. Abdominal pain, secondary to low grade partial small bowel obstruction. 2  Left  adnexal complex cystic mass measuring 53 mm x 87 mm x 47  Mm. (this will be f/u on an outpatient basis) 3. Multiple other medical problems being managed by medicine team. Their notes are seen and appreciated.  Plan: 1. NPO, NG to LCWS 2. D/C abx as WBC have been normal and she remains afebrile. 3. IVF 4. Management of her other medical problems per medicine team. Recommend switching as many meds over to IV as possible and prudent utilization of narcotics for her pain management as these may exacerbate the SBO.  5. ? Need for possible laparoscopic examination if s/s do not resolve with conservative management.  Thank you for this consult we will continue to follow along with you.     Gaelyn Tukes 03/27/2012, 9:35 AM

## 2012-03-27 NOTE — Progress Notes (Signed)
Patient off unit, pt requesting to clear her mind and get fresh air prior to being connected to NGT wall suction

## 2012-03-27 NOTE — Progress Notes (Signed)
[facility] PROGRESS NOTE  Emma Munoz NWG:[id number] DOB: 05/26/1966 DOA: 03/26/2012 PCP: Shelia Media, MD  Assessment/Plan: Principal Problem:  *SBO (small bowel obstruction) Active Problems:  HYPERLIPIDEMIA  DEPRESSION  REFLEX SYMPATHETIC DYSTROPHY  MIGRAINES, HX OF  Diabetes mellitus type 2, uncontrolled  Primary hyperparathyroidism  Hypoparathyroidism  1. Recurrent small bowel obstructions-status post remote cholecystectomy. Plan for minimal oral intake, relief of constipation and close followup with the surgical team 2. Chronic pain syndrome on high-dose opiates - we have converted the MS Contin to fentanyl patch 3. History of migraine headaches-will treat with Tylenol when necessary  Code Status: full Family Communication: patient Disposition Plan: home  Consultants:  surgery  Procedures:  CT abdomen and pelvis  Antibiotics:  none  HPI/Subjective: Continues to feel that the abdomen is distended  Objective: Filed Vitals:   03/27/12 0234 03/27/12 0615 03/27/12 1511 03/27/12 1516  BP: 142/90 144/95 171/115 168/92  Pulse: 77 80    Temp: 98.2 F (36.8 C) 98 F (36.7 C)    TempSrc: Oral Oral    Resp: 20 16    Height: 5\' 7"  (1.702 m)     Weight: 99.8 kg (220 lb 0.3 oz)     SpO2: 96% 97%      Intake/Output Summary (Last 24 hours) at 03/27/12 1735 Last data filed at 03/27/12 1100  Gross per 24 hour  Intake    680 ml  Output      0 ml  Net    680 ml   Filed Weights   03/27/12 0234  Weight: 99.8 kg (220 lb 0.3 oz)    Exam:   General:  Alert and oriented x3  Cardiovascular: regular rate and rhythm  Respiratory: clear to auscultation bilaterally  Abdomen: soft distended  Data Reviewed: Basic Metabolic Panel:  Lab 03/26/12 5784  NA 137  K 4.3  CL 104  CO2 22  GLUCOSE 106*  BUN 9  CREATININE 0.58  CALCIUM 10.8*  MG --  PHOS --   Liver Function Tests:  Lab 03/26/12 1401  AST 21  ALT 20  ALKPHOS 102  BILITOT 0.3    PROT 7.1  ALBUMIN 3.7   No results found for this basename: LIPASE:5,AMYLASE:5 in the last 168 hours No results found for this basename: AMMONIA:5 in the last 168 hours CBC:  Lab 03/26/12 1401  WBC 7.8  NEUTROABS 4.2  HGB 14.7  HCT 41.2  MCV 81.3  PLT 271   Cardiac Enzymes: No results found for this basename: CKTOTAL:5,CKMB:5,CKMBINDEX:5,TROPONINI:5 in the last 168 hours BNP (last 3 results) No results found for this basename: PROBNP:3 in the last 8760 hours CBG:  Lab 03/27/12 0306  GLUCAP 84    No results found for this or any previous visit (from the past 240 hour(s)).   Studies: Dg Abd 1 View  03/27/2012  *RADIOLOGY REPORT*  Clinical Data: The small bowel obstruction.  Abdominal distension and pain.  ABDOMEN - 1 VIEW  Comparison: Acute abdominal series 03/26/2012.  Findings: There is some residual oral contrast material is scattered throughout the bowel, predominantly within the colon at this time, related to recent CT of the abdomen and pelvis 03/26/2012.  There is a paucity of distal colonic or rectal gas or stool (portions of the rectum are obscured by the urinary bladder which is filled with excrete contrast material).  No definite pathologic distension of small bowel is noted on today's examination.  No gross evidence of pneumoperitoneum on this single supine view.  An electronic  device projects over the right side of the abdomen with leads extending cephalad, likely representing a spinal cord stimulator.  Surgical clips project over the right upper quadrant of the abdomen, consistent with prior cholecystectomy.  IMPRESSION: 1.  Nonspecific, nonobstructive bowel gas pattern. 2.  Additional incidental findings, as above.  Original Report Authenticated By: Florencia Reasons, M.D.   Ct Abdomen Pelvis W Contrast  03/26/2012  *RADIOLOGY REPORT*  Clinical Data: Abdominal pain.  Nausea and vomiting.  Constipation. The operative history including hysterectomy, cholecystectomy and  cesarean section.  CT ABDOMEN AND PELVIS WITH CONTRAST  Technique:  Multidetector CT imaging of the abdomen and pelvis was performed following the standard protocol during bolus administration of intravenous contrast.  Contrast: OMNIPAQUE IOHEXOL 300 MG/ML  SOLN  Comparison: 05/01/2010.  Findings: Marked dependent atelectasis is present at the lung bases.  Lingular atelectasis or scarring.  Heart grossly appears normal.  Liver appears normal.  Cholecystectomy.  Mild atrophy of the pancreas.  Spleen normal.  Adrenal glands normal.  Normal renal enhancement and delayed excretion of contrast. Relative right renal atrophy.  Spinal stimulator noted in the right posterior flank soft tissues.  Small bowel an anastomosis in the right lower quadrant. There is decompressed small bowel adjacent to the anastomoses with mild dilation and fluid-filled small bowel proximally and partial small bowel obstruction although low grade may be present.  Tiny amount of free fluid in the pouch of [place].  Large stool burden is present in the sigmoid colon.  Liquid stool is present in the ascending colon, transverse and descending colon which is abnormal but nonspecific.  This may be related to enteric infection or obstipation in this patient with a prominent distal stool burden.  The urinary bladder appears within normal limits.  In the left adnexal region, there is an enlarging solid and cystic lesion measuring 53 mm x 87 mm. Craniocaudal extent is 47 mm.  This demonstrates a thickened rind of anterior soft tissue and debris evident thick central septation, greater than expected for complex ovarian cyst and suspicious for ovarian neoplasm.  There is no inguinal adenopathy.  No iliac adenopathy is identified.  Right ovary appears within normal limits. Hysterectomy.  Delayed renal excretion of contrast appears normal. L4-L5 L5-S1 lumbar spondylosis.  Midline abdominal scar in the periumbilical region. Normal appendix identified in the  right lower quadrant.  IMPRESSION: 1.  Liquid stool present in the colon with a large distal colonic stool burden.  Although nonspecific, these findings may be associated with enteric infection or obstipation. 2.  Right lower quadrant small bowel anastomosis with decompressed distal small bowel and mild dilation of small bowel proximally suggesting low grade partial small bowel obstruction. 3.  Left adnexal complex cystic mass measuring 53 mm x 87 mm x 47 mm.  6-week follow-up pelvic ultrasound recommended to assess for resolution.  If the mass has not resolved, surgical consultation recommended.  Original Report Authenticated By: Andreas Newport, M.D.   Dg Abd Acute W/chest  03/26/2012  *RADIOLOGY REPORT*  Clinical Data: 46 year old female with abdominal pain nausea vomiting.  Cholecystectomy in April.  ACUTE ABDOMEN SERIES (ABDOMEN 2 VIEW & CHEST 1 VIEW)  Comparison: 09/19/2006.  Findings: New electrode leads extending from the abdomen into the neck.  Lower lung volumes.  Chronic increased interstitial markings at the lung bases have not significantly changed.  No pneumothorax or pneumoperitoneum.  Cardiac size at the upper limits of normal. Other mediastinal contours are within normal limits.  No pleural effusion.  Generator device projects over the right lower quadrant.  Evidence of retained stool in the colon.  Bowel gas in nondilated distal colon.  Occasional small bowel gas, no dilated loops are evident. No acute osseous abnormality identified.  IMPRESSION: 1. Nonobstructed bowel gas pattern, no free air. 2.  Placement of head or neck stimulator type device since 2008.  Original Report Authenticated By: Ulla Potash III, M.D.    Scheduled Meds:    . enoxaparin (LOVENOX) injection  40 mg Subcutaneous Daily  . fentaNYL  50 mcg Transdermal Q72H  . iohexol  20 mL Oral Q1 Hr x 2  . iohexol  20 mL Oral Q1 Hr x 2  . lidocaine   Topical Once  . metoprolol  5 mg Intravenous Q6H  .  morphine injection  4 mg  Intravenous Once  . ondansetron (ZOFRAN) IV  4 mg Intravenous Once  . pantoprazole (PROTONIX) IV  40 mg Intravenous Q24H  . sodium chloride  1,000 mL Intravenous Once  . DISCONTD: ciprofloxacin  400 mg Intravenous Q12H  . DISCONTD: cyclobenzaprine  20 mg Oral QHS  . DISCONTD: metoprolol succinate  100 mg Oral Daily  . DISCONTD: metronidazole  500 mg Intravenous Q8H  . DISCONTD: morphine  80 mg Oral BID  . DISCONTD: morphine  45 mg Oral Q12H  . DISCONTD: morphine  60 mg Oral Q8H  . DISCONTD:  morphine injection  6 mg Intravenous Once  . DISCONTD: Vitamin D (Ergocalciferol)  50,000 Units Oral 2 times weekly   Continuous Infusions:    . dextrose 5 % and 0.45% NaCl 125 mL/hr at 03/27/12 1245    Principal Problem:  *SBO (small bowel obstruction) Active Problems:  HYPERLIPIDEMIA  DEPRESSION  REFLEX SYMPATHETIC DYSTROPHY  MIGRAINES, HX OF  Diabetes mellitus type 2, uncontrolled  Primary hyperparathyroidism  Hypoparathyroidism    Time spent: 40 minutes    Tiandra Swoveland  [facility] Pager [phone]. If 8PM-8AM, please contact night-coverage at [email or web address], password [facility] 03/27/2012, 5:35 PM  LOS: 1 day

## 2012-03-27 NOTE — Consult Note (Signed)
Xrays today are nonspecific and bowel obstruction seems to be resolving. She also probably has an element of poor motility due to her narcotic use. Since her abdomen is soft now and she is passing flatus we will hold the ng tube for now. If she continues to improve then she may need to be studied with upper gi and small bowel follow through to look for a stricture. We will follow

## 2012-03-27 NOTE — H&P (Signed)
[facility]                                                                                    Patient Demographics  Emma Munoz, is a 46 y.o. female  CSN: [id number]  MRN: [id number]  DOB - 04/21/1966  Admit Date - 03/26/2012  Outpatient Primary MD for the patient is Shelia Media, MD   With History of -  Past Medical History  Diagnosis Date  . Diabetes mellitus   . Hypertension   . Tobacco abuse       Past Surgical History  Procedure Date  . Laparoscopic hysterectomy 2005  . Cesarean section 85, 87, 95  . Lumbar disc surgery     x2  . Knee surgery     Realignment surgery  . Hand surgery   . Ulnar nerve repair   . Rotator cuff repair   . Nerve stimulator in lumbar spine   . Cholecystectomy     With complications  . Myomectomy   . Abdominal surgery     in for   Chief Complaint  Patient presents with  . Abdominal Pain     HPI  Emma Munoz  is a 46 y.o. female, who underwent cholecystectomy in April of this year at [facility]. hospital complicated by small bowel injury necessitating repair according to the patient and multiple admissions after that for small bowel obstruction 2 [facility] presenting today with 2 days history of abdominal pain nausea and vomiting yesterday the pain was around 10 over 10 and today it went down to 4/10 the patient cannot keep most of the things down. Patient has a history of chronic pain in her left hand status post stimulator placement put a crushing injury. She is on chronic pain medications   Review of Systems    In addition to the HPI above, No Fever-chills, No Headache, No changes with Vision or hearing, No problems swallowing food or Liquids, No Chest pain, Cough or Shortness of Breath, + Abdominal pain,+Nausea or Vommitting, No Blood in stool or Urine, No dysuria, No new skin rashes or bruises, No new joints pains-aches,  No new weakness, tingling, numbness in any extremity, No  recent weight gain or loss, No polyuria, polydypsia or polyphagia, No significant Mental Stressors.  A full 10 point Review of Systems was done, except as stated above, all other Review of Systems were negative.   Social History History  Substance Use Topics  . Smoking status: Current Everyday Smoker -- 1.0 packs/day  . Smokeless tobacco: Never Used  . Alcohol Use: Yes     Rarely    Family History Family History  Problem Relation Age of Onset  . Early death Father 23    GUNSHOT  . Kidney disease Brother     s/p transplant  . Heart disease Maternal Grandmother     Heart defect  . Prostate cancer Maternal Grandfather      Prior to Admission medications   Medication Sig Start Date End Date Taking? Authorizing Provider  cyclobenzaprine (FLEXERIL) 10 MG tablet Take 20 mg by mouth at bedtime.   Yes Historical Provider, MD  esomeprazole (NEXIUM) 40 MG  capsule Take 40 mg by mouth daily.   Yes Historical Provider, MD  lisinopril (PRINIVIL,ZESTRIL) 20 MG tablet Take 20 mg by mouth daily.   Yes Historical Provider, MD  metoprolol succinate (TOPROL-XL) 100 MG 24 hr tablet Take 100 mg by mouth daily. Take with or immediately following a meal.   Yes Historical Provider, MD  morphine (KADIAN) 80 MG 24 hr capsule Take 80 mg by mouth 2 (two) times daily.   Yes Historical Provider, MD  morphine (MSIR) 30 MG tablet Take 30 mg by mouth every 6 (six) hours as needed. For pain   Yes Historical Provider, MD  Vitamin D, Ergocalciferol, (DRISDOL) 50000 UNITS CAPS Take 50,000 Units by mouth 2 (two) times a week. Monday & Thursdays   Yes Historical Provider, MD  glucose blood (FREESTYLE LITE) test strip Test twice daily DX 250.02 10/19/11 10/18/12  Shelia Media, MD  Lancets (FREESTYLE) lancets Use as instructed Twice daily DX: 250.02 10/19/11 10/18/12  Shelia Media, MD    Allergies  Allergen Reactions  . Gadolinium Derivatives     Pt states nausea and vomiting to GAD in MRI pt states she does ok  with iv/cm in cat scan  . Aspirin     REACTION: Tongue, throat, extremities swell  . Ibuprofen     REACTION: Tongue, throat, extremities swell  . Nsaids     REACTION: Tongue, throat, extremities swell    Physical Exam  Vitals  Blood pressure 159/111, pulse 77, temperature 98.2 F (36.8 C), temperature source Oral, resp. rate 20, SpO2 97.00%.   1. General middle-aged African American female lying in bed tired  2. Normal affect and insight, Not Suicidal or Homicidal, Awake Alert, Oriented X 3.  3. No F.N deficits, ALL C.Nerves Intact, Strength 5/5 all 4 extremities, Sensation intact all 4 extremities, Plantars down going.  4. Ears and Eyes appear Normal, Conjunctivae clear, PERRLA. Moist Oral Mucosa.  5. Supple Neck, No JVD, No cervical lymphadenopathy appriciated, No Carotid Bruits.  6. Symmetrical Chest wall movement, Good air movement bilaterally, CTAB.  7. RRR, No Gallops, Rubs or Murmurs, No Parasternal Heave.  8. degrees bowel sounds , distended abdomen scars of old surgery well healed , generalized tenderness to palpation most of the left lower quadrant  9.  No Cyanosis, Normal Skin Turgor, No Skin Rash or Bruise.  10. Good muscle tone,  joints appear normal , no effusions, Normal ROM.  11. No Palpable Lymph Nodes in Neck or Axillae   Data Review  CBC  Lab 03/26/12 1401  WBC 7.8  HGB 14.7  HCT 41.2  PLT 271  MCV 81.3  MCH 29.0  MCHC 35.7  RDW 13.6  LYMPHSABS 2.8  MONOABS 0.5  EOSABS 0.2  BASOSABS 0.0  BANDABS --   ------------------------------------------------------------------------------------------------------------------  Chemistries   Lab 03/26/12 1401  NA 137  K 4.3  CL 104  CO2 22  GLUCOSE 106*  BUN 9  CREATININE 0.58  CALCIUM 10.8*  MG --  AST 21  ALT 20  ALKPHOS 102  BILITOT 0.3   ------------------------------------------------------------------------------------------------------------------ CrCl is unknown because both  a height and weight (above a minimum accepted value) are required for this calculation. ------------------------------------------------------------------------------------------------------------------ No results found for this basename: TSH,T4TOTAL,FREET3,T3FREE,THYROIDAB in the last 72 hours   Coagulation profile No results found for this basename: INR:5,PROTIME:5 in the last 168 hours ------------------------------------------------------------------------------------------------------------------- No results found for this basename: DDIMER:2 in the last 72 hours -------------------------------------------------------------------------------------------------------------------  Cardiac Enzymes No results found for this basename: CK:3,CKMB:3,TROPONINI:3,MYOGLOBIN:3  in the last 168 hours ------------------------------------------------------------------------------------------------------------------ No components found with this basename: POCBNP:3   ---------------------------------------------------------------------------------------------------------------  Urinalysis    Component Value Date/Time   COLORURINE YELLOW 03/26/2012 2055   APPEARANCEUR CLOUDY* 03/26/2012 2055   LABSPEC 1.026 03/26/2012 2055   PHURINE 5.0 03/26/2012 2055   GLUCOSEU NEGATIVE 03/26/2012 2055   HGBUR NEGATIVE 03/26/2012 2055   BILIRUBINUR SMALL* 03/26/2012 2055   KETONESUR NEGATIVE 03/26/2012 2055   PROTEINUR 30* 03/26/2012 2055   UROBILINOGEN 1.0 03/26/2012 2055   NITRITE NEGATIVE 03/26/2012 2055   LEUKOCYTESUR NEGATIVE 03/26/2012 2055    ----------------------------------------------------------------------------------------------------------------     Imaging results:   Ct Abdomen Pelvis W Contrast  03/26/2012  *RADIOLOGY REPORT*  Clinical Data: Abdominal pain.  Nausea and vomiting.  Constipation. The operative history including hysterectomy, cholecystectomy and cesarean section.  CT ABDOMEN AND PELVIS  WITH CONTRAST  Technique:  Multidetector CT imaging of the abdomen and pelvis was performed following the standard protocol during bolus administration of intravenous contrast.  Contrast: OMNIPAQUE IOHEXOL 300 MG/ML  SOLN  Comparison: 05/01/2010.  Findings: Marked dependent atelectasis is present at the lung bases.  Lingular atelectasis or scarring.  Heart grossly appears normal.  Liver appears normal.  Cholecystectomy.  Mild atrophy of the pancreas.  Spleen normal.  Adrenal glands normal.  Normal renal enhancement and delayed excretion of contrast. Relative right renal atrophy.  Spinal stimulator noted in the right posterior flank soft tissues.  Small bowel an anastomosis in the right lower quadrant. There is decompressed small bowel adjacent to the anastomoses with mild dilation and fluid-filled small bowel proximally and partial small bowel obstruction although low grade may be present.  Tiny amount of free fluid in the pouch of [place].  Large stool burden is present in the sigmoid colon.  Liquid stool is present in the ascending colon, transverse and descending colon which is abnormal but nonspecific.  This may be related to enteric infection or obstipation in this patient with a prominent distal stool burden.  The urinary bladder appears within normal limits.  In the left adnexal region, there is an enlarging solid and cystic lesion measuring 53 mm x 87 mm. Craniocaudal extent is 47 mm.  This demonstrates a thickened rind of anterior soft tissue and debris evident thick central septation, greater than expected for complex ovarian cyst and suspicious for ovarian neoplasm.  There is no inguinal adenopathy.  No iliac adenopathy is identified.  Right ovary appears within normal limits. Hysterectomy.  Delayed renal excretion of contrast appears normal. L4-L5 L5-S1 lumbar spondylosis.  Midline abdominal scar in the periumbilical region. Normal appendix identified in the right lower quadrant.  IMPRESSION: 1.   Liquid stool present in the colon with a large distal colonic stool burden.  Although nonspecific, these findings may be associated with enteric infection or obstipation. 2.  Right lower quadrant small bowel anastomosis with decompressed distal small bowel and mild dilation of small bowel proximally suggesting low grade partial small bowel obstruction. 3.  Left adnexal complex cystic mass measuring 53 mm x 87 mm x 47 mm.  6-week follow-up pelvic ultrasound recommended to assess for resolution.  If the mass has not resolved, surgical consultation recommended.  Original Report Authenticated By: Andreas Newport, M.D.   Dg Abd Acute W/chest  03/26/2012  *RADIOLOGY REPORT*  Clinical Data: 46 year old female with abdominal pain nausea vomiting.  Cholecystectomy in April.  ACUTE ABDOMEN SERIES (ABDOMEN 2 VIEW & CHEST 1 VIEW)  Comparison: 09/19/2006.  Findings: New electrode leads extending from the abdomen  into the neck.  Lower lung volumes.  Chronic increased interstitial markings at the lung bases have not significantly changed.  No pneumothorax or pneumoperitoneum.  Cardiac size at the upper limits of normal. Other mediastinal contours are within normal limits.  No pleural effusion.  Generator device projects over the right lower quadrant.  Evidence of retained stool in the colon.  Bowel gas in nondilated distal colon.  Occasional small bowel gas, no dilated loops are evident. No acute osseous abnormality identified.  IMPRESSION: 1. Nonobstructed bowel gas pattern, no free air. 2.  Placement of head or neck stimulator type device since 2008.  Original Report Authenticated By: Harley Hallmark, M.D.      Assessment & Plan  Principal Problem:  *SBO (small bowel obstruction) Active Problems:  Hypoparathyroidism    1. small bowel obstruction; the patient underwent cholecystectomy at [facility] in April 2013 was complicated by injury to the small bowel and subsequent repair. Multiple admissions for small  bowel obstruction at the [facility] CT of the abdomen reviewed. Start patient on IV Cipro and Flagyl. Clear liquid diet for now with medications to be continued. Holding the ACE inhibitor for now and continuing the Toprol-XL. Surgery consult placed. Follow abdominal x-ray in a.m.  2. Left adnexal mass noted by CT abdomen 3 followup as outpatient.  3. Hypertension may: A molding and ACE inhibitor continue Toprol-XL. Can give when necessary medications if blood pressure increases.  4. Hypoparathyroidism total as outpatient  5. Chronic pain syndrome with left hand pain status post stimulator placement . We'll continue pain medications   DVT Prophylaxis Lovenox AM Labs Ordered, also please review Full Orders  Family Communication: Admission, patients condition and plan of care including tests being ordered have been discussed with the patient and  husband who indicate understanding and agree with the plan and Code Status.  Code Status full  Disposition Plan: Home with office followup Time spent in minutes : 40 Condition fair

## 2012-03-27 NOTE — Progress Notes (Signed)
Patient arrived at 5500 from ED.  She is alert and oriented times four.  From home with husband.  She has been oriented to 5500, the call bell system, safety measures, and acknowledges understanding.  POC: pain relief and antibiotic administration.  Will continue to monitor patient.

## 2012-03-27 NOTE — Progress Notes (Signed)
1516 Dr. Lavera Guise made aware of patieent elevated blood pressure.

## 2012-03-28 DIAGNOSIS — J309 Allergic rhinitis, unspecified: Secondary | ICD-10-CM

## 2012-03-28 MED ORDER — MORPHINE SULFATE 4 MG/ML IJ SOLN
8.0000 mg | INTRAMUSCULAR | Status: DC | PRN
  Administered 2012-03-28 – 2012-03-29 (×4): 8 mg via INTRAVENOUS
  Administered 2012-03-30: 4 mg via INTRAVENOUS
  Administered 2012-03-31: 8 mg via INTRAVENOUS
  Filled 2012-03-28 (×2): qty 2
  Filled 2012-03-28: qty 1
  Filled 2012-03-28 (×3): qty 2

## 2012-03-28 NOTE — ED Provider Notes (Signed)
Medical screening examination/treatment/procedure(s) were performed by non-physician practitioner and as supervising physician I was immediately available for consultation/collaboration.   Alonia Dibuono B. Toma Erichsen, MD 03/28/12 1207 

## 2012-03-28 NOTE — Progress Notes (Signed)
Subjective: Sitting up in bed this morning, does not appear to be in any acute distress, states that she has started to pass Flatus, no BM. No N/V. abdominal distention feels less to her.  Objective: Vital signs in last 24 hours: Temp:  [98.2 F (36.8 C)-99 F (37.2 C)] 98.2 F (36.8 C) (08/16 0546) Pulse Rate:  [69-83] 69  (08/16 0546) Resp:  [16-20] 16  (08/16 0546) BP: (147-186)/(92-130) 152/97 mmHg (08/16 0617) SpO2:  [98 %] 98 % (08/16 0546) Last BM Date: 03/26/12  Intake/Output from previous day: 08/15 0701 - 08/16 0700 In: 1555 [P.O.:680; I.V.:875] Out: -  Intake/Output this shift: Total I/O In: 700 [I.V.:700] Out: -   General appearance: alert, cooperative, appears stated age and no distress Chest: CTA bilaterally Abdomen: Obese, soft, non tender, +BS, + flatus, no BM.   Lab Results:   Basename 03/26/12 1401  WBC 7.8  HGB 14.7  HCT 41.2  PLT 271   BMET  Basename 03/26/12 1401  NA 137  K 4.3  CL 104  CO2 22  GLUCOSE 106*  BUN 9  CREATININE 0.58  CALCIUM 10.8*   PT/INR No results found for this basename: LABPROT:2,INR:2 in the last 72 hours ABG No results found for this basename: PHART:2,PCO2:2,PO2:2,HCO3:2 in the last 72 hours  Studies/Results: Dg Abd 1 View  03/27/2012  *RADIOLOGY REPORT*  Clinical Data: The small bowel obstruction.  Abdominal distension and pain.  ABDOMEN - 1 VIEW  Comparison: Acute abdominal series 03/26/2012.  Findings: There is some residual oral contrast material is scattered throughout the bowel, predominantly within the colon at this time, related to recent CT of the abdomen and pelvis 03/26/2012.  There is a paucity of distal colonic or rectal gas or stool (portions of the rectum are obscured by the urinary bladder which is filled with excrete contrast material).  No definite pathologic distension of small bowel is noted on today's examination.  No gross evidence of pneumoperitoneum on this single supine view.  An electronic  device projects over the right side of the abdomen with leads extending cephalad, likely representing a spinal cord stimulator.  Surgical clips project over the right upper quadrant of the abdomen, consistent with prior cholecystectomy.  IMPRESSION: 1.  Nonspecific, nonobstructive bowel gas pattern. 2.  Additional incidental findings, as above.  Original Report Authenticated By: Florencia Reasons, M.D.   Ct Abdomen Pelvis W Contrast  03/26/2012  *RADIOLOGY REPORT*  Clinical Data: Abdominal pain.  Nausea and vomiting.  Constipation. The operative history including hysterectomy, cholecystectomy and cesarean section.  CT ABDOMEN AND PELVIS WITH CONTRAST  Technique:  Multidetector CT imaging of the abdomen and pelvis was performed following the standard protocol during bolus administration of intravenous contrast.  Contrast: OMNIPAQUE IOHEXOL 300 MG/ML  SOLN  Comparison: 05/01/2010.  Findings: Marked dependent atelectasis is present at the lung bases.  Lingular atelectasis or scarring.  Heart grossly appears normal.  Liver appears normal.  Cholecystectomy.  Mild atrophy of the pancreas.  Spleen normal.  Adrenal glands normal.  Normal renal enhancement and delayed excretion of contrast. Relative right renal atrophy.  Spinal stimulator noted in the right posterior flank soft tissues.  Small bowel an anastomosis in the right lower quadrant. There is decompressed small bowel adjacent to the anastomoses with mild dilation and fluid-filled small bowel proximally and partial small bowel obstruction although low grade may be present.  Tiny amount of free fluid in the pouch of [place].  Large stool burden is present in the  sigmoid colon.  Liquid stool is present in the ascending colon, transverse and descending colon which is abnormal but nonspecific.  This may be related to enteric infection or obstipation in this patient with a prominent distal stool burden.  The urinary bladder appears within normal limits.  In the  left adnexal region, there is an enlarging solid and cystic lesion measuring 53 mm x 87 mm. Craniocaudal extent is 47 mm.  This demonstrates a thickened rind of anterior soft tissue and debris evident thick central septation, greater than expected for complex ovarian cyst and suspicious for ovarian neoplasm.  There is no inguinal adenopathy.  No iliac adenopathy is identified.  Right ovary appears within normal limits. Hysterectomy.  Delayed renal excretion of contrast appears normal. L4-L5 L5-S1 lumbar spondylosis.  Midline abdominal scar in the periumbilical region. Normal appendix identified in the right lower quadrant.  IMPRESSION: 1.  Liquid stool present in the colon with a large distal colonic stool burden.  Although nonspecific, these findings may be associated with enteric infection or obstipation. 2.  Right lower quadrant small bowel anastomosis with decompressed distal small bowel and mild dilation of small bowel proximally suggesting low grade partial small bowel obstruction. 3.  Left adnexal complex cystic mass measuring 53 mm x 87 mm x 47 mm.  6-week follow-up pelvic ultrasound recommended to assess for resolution.  If the mass has not resolved, surgical consultation recommended.  Original Report Authenticated By: Andreas Newport, M.D.   Dg Abd Acute W/chest  03/26/2012  *RADIOLOGY REPORT*  Clinical Data: 46 year old female with abdominal pain nausea vomiting.  Cholecystectomy in April.  ACUTE ABDOMEN SERIES (ABDOMEN 2 VIEW & CHEST 1 VIEW)  Comparison: 09/19/2006.  Findings: New electrode leads extending from the abdomen into the neck.  Lower lung volumes.  Chronic increased interstitial markings at the lung bases have not significantly changed.  No pneumothorax or pneumoperitoneum.  Cardiac size at the upper limits of normal. Other mediastinal contours are within normal limits.  No pleural effusion.  Generator device projects over the right lower quadrant.  Evidence of retained stool in the colon.   Bowel gas in nondilated distal colon.  Occasional small bowel gas, no dilated loops are evident. No acute osseous abnormality identified.  IMPRESSION: 1. Nonobstructed bowel gas pattern, no free air. 2.  Placement of head or neck stimulator type device since 2008.  Original Report Authenticated By: Harley Hallmark, M.D.    Anti-infectives: Anti-infectives     Start     Dose/Rate Route Frequency Ordered Stop   03/27/12 0130   ciprofloxacin (CIPRO) IVPB 400 mg  Status:  Discontinued        400 mg 200 mL/hr over 60 Minutes Intravenous Every 12 hours 03/27/12 0123 03/27/12 1007   03/27/12 0130   metroNIDAZOLE (FLAGYL) IVPB 500 mg  Status:  Discontinued        500 mg 100 mL/hr over 60 Minutes Intravenous Every 8 hours 03/27/12 0123 03/27/12 1007          Assessment/Plan: s/p * No surgery found * 1. SBO (slowly resolving)  Will Leave NPO for now Encourage ambulation ? Advance to sips of clears later today (will discuss with Dr. Carolynne Edouard) Patient will need upper GI with small bowel follow through at some point, to determine where her stricture is.  We will continue to follow     LOS: 2 days    Arlen Dupuis 03/28/2012

## 2012-03-28 NOTE — Progress Notes (Signed)
[facility] PROGRESS NOTE  JAIDALYN SCHILLO NWG:[id number] DOB: 1966-07-18 DOA: 03/26/2012 PCP: Shelia Media, MD  Assessment/Plan: Principal Problem:  *SBO (small bowel obstruction) Active Problems:  HYPERLIPIDEMIA  DEPRESSION  REFLEX SYMPATHETIC DYSTROPHY  MIGRAINES, HX OF  Diabetes mellitus type 2, uncontrolled  Primary hyperparathyroidism  Hypoparathyroidism  1. Recurrent small bowel obstructions-status post remote cholecystectomy. The patient is NPO as requested by surgeon. Requested old records from [facility] 2. Chronic pain syndrome on high-dose opiates - we have converted the MS Contin to fentanyl patch 3. History of migraine headaches-will treat with Tylenol when necessary  Code Status: full Family Communication: husband at bedside  Disposition Plan: home  Consultants:  surgery  Procedures:  CT abdomen and pelvis  Antibiotics:  none  HPI/Subjective: Continues to feel that the abdomen is distended, passing flatus   Objective: Filed Vitals:   03/27/12 2049 03/28/12 0546 03/28/12 0617 03/28/12 1449  BP: 147/109 186/130 152/97 170/110  Pulse: 83 69  82  Temp: 99 F (37.2 C) 98.2 F (36.8 C)  98.6 F (37 C)  TempSrc: Oral Oral  Oral  Resp: 20 16  16   Height:      Weight:      SpO2: 98% 98%  97%    Intake/Output Summary (Last 24 hours) at 03/28/12 1725 Last data filed at 03/28/12 0745  Gross per 24 hour  Intake   1575 ml  Output      0 ml  Net   1575 ml   Filed Weights   03/27/12 0234  Weight: 99.8 kg (220 lb 0.3 oz)    Exam:   General:  Alert and oriented x3  Cardiovascular: regular rate and rhythm  Respiratory: clear to auscultation bilaterally  Abdomen: soft distended  Data Reviewed: Basic Metabolic Panel:  Lab 03/26/12 5784  NA 137  K 4.3  CL 104  CO2 22  GLUCOSE 106*  BUN 9  CREATININE 0.58  CALCIUM 10.8*  MG --  PHOS --   Liver Function Tests:  Lab 03/26/12 1401  AST 21  ALT 20  ALKPHOS 102  BILITOT 0.3    PROT 7.1  ALBUMIN 3.7   No results found for this basename: LIPASE:5,AMYLASE:5 in the last 168 hours No results found for this basename: AMMONIA:5 in the last 168 hours CBC:  Lab 03/26/12 1401  WBC 7.8  NEUTROABS 4.2  HGB 14.7  HCT 41.2  MCV 81.3  PLT 271   Cardiac Enzymes: No results found for this basename: CKTOTAL:5,CKMB:5,CKMBINDEX:5,TROPONINI:5 in the last 168 hours BNP (last 3 results) No results found for this basename: PROBNP:3 in the last 8760 hours CBG:  Lab 03/27/12 0306  GLUCAP 84    No results found for this or any previous visit (from the past 240 hour(s)).   Studies: Dg Abd 1 View  03/27/2012  *RADIOLOGY REPORT*  Clinical Data: The small bowel obstruction.  Abdominal distension and pain.  ABDOMEN - 1 VIEW  Comparison: Acute abdominal series 03/26/2012.  Findings: There is some residual oral contrast material is scattered throughout the bowel, predominantly within the colon at this time, related to recent CT of the abdomen and pelvis 03/26/2012.  There is a paucity of distal colonic or rectal gas or stool (portions of the rectum are obscured by the urinary bladder which is filled with excrete contrast material).  No definite pathologic distension of small bowel is noted on today's examination.  No gross evidence of pneumoperitoneum on this single supine view.  An electronic device projects over  the right side of the abdomen with leads extending cephalad, likely representing a spinal cord stimulator.  Surgical clips project over the right upper quadrant of the abdomen, consistent with prior cholecystectomy.  IMPRESSION: 1.  Nonspecific, nonobstructive bowel gas pattern. 2.  Additional incidental findings, as above.  Original Report Authenticated By: Florencia Reasons, M.D.   Ct Abdomen Pelvis W Contrast  03/26/2012  *RADIOLOGY REPORT*  Clinical Data: Abdominal pain.  Nausea and vomiting.  Constipation. The operative history including hysterectomy, cholecystectomy and  cesarean section.  CT ABDOMEN AND PELVIS WITH CONTRAST  Technique:  Multidetector CT imaging of the abdomen and pelvis was performed following the standard protocol during bolus administration of intravenous contrast.  Contrast: OMNIPAQUE IOHEXOL 300 MG/ML  SOLN  Comparison: 05/01/2010.  Findings: Marked dependent atelectasis is present at the lung bases.  Lingular atelectasis or scarring.  Heart grossly appears normal.  Liver appears normal.  Cholecystectomy.  Mild atrophy of the pancreas.  Spleen normal.  Adrenal glands normal.  Normal renal enhancement and delayed excretion of contrast. Relative right renal atrophy.  Spinal stimulator noted in the right posterior flank soft tissues.  Small bowel an anastomosis in the right lower quadrant. There is decompressed small bowel adjacent to the anastomoses with mild dilation and fluid-filled small bowel proximally and partial small bowel obstruction although low grade may be present.  Tiny amount of free fluid in the pouch of [place].  Large stool burden is present in the sigmoid colon.  Liquid stool is present in the ascending colon, transverse and descending colon which is abnormal but nonspecific.  This may be related to enteric infection or obstipation in this patient with a prominent distal stool burden.  The urinary bladder appears within normal limits.  In the left adnexal region, there is an enlarging solid and cystic lesion measuring 53 mm x 87 mm. Craniocaudal extent is 47 mm.  This demonstrates a thickened rind of anterior soft tissue and debris evident thick central septation, greater than expected for complex ovarian cyst and suspicious for ovarian neoplasm.  There is no inguinal adenopathy.  No iliac adenopathy is identified.  Right ovary appears within normal limits. Hysterectomy.  Delayed renal excretion of contrast appears normal. L4-L5 L5-S1 lumbar spondylosis.  Midline abdominal scar in the periumbilical region. Normal appendix identified in the  right lower quadrant.  IMPRESSION: 1.  Liquid stool present in the colon with a large distal colonic stool burden.  Although nonspecific, these findings may be associated with enteric infection or obstipation. 2.  Right lower quadrant small bowel anastomosis with decompressed distal small bowel and mild dilation of small bowel proximally suggesting low grade partial small bowel obstruction. 3.  Left adnexal complex cystic mass measuring 53 mm x 87 mm x 47 mm.  6-week follow-up pelvic ultrasound recommended to assess for resolution.  If the mass has not resolved, surgical consultation recommended.  Original Report Authenticated By: Andreas Newport, M.D.   Dg Abd Acute W/chest  03/26/2012  *RADIOLOGY REPORT*  Clinical Data: 46 year old female with abdominal pain nausea vomiting.  Cholecystectomy in April.  ACUTE ABDOMEN SERIES (ABDOMEN 2 VIEW & CHEST 1 VIEW)  Comparison: 09/19/2006.  Findings: New electrode leads extending from the abdomen into the neck.  Lower lung volumes.  Chronic increased interstitial markings at the lung bases have not significantly changed.  No pneumothorax or pneumoperitoneum.  Cardiac size at the upper limits of normal. Other mediastinal contours are within normal limits.  No pleural effusion.  Generator device projects  over the right lower quadrant.  Evidence of retained stool in the colon.  Bowel gas in nondilated distal colon.  Occasional small bowel gas, no dilated loops are evident. No acute osseous abnormality identified.  IMPRESSION: 1. Nonobstructed bowel gas pattern, no free air. 2.  Placement of head or neck stimulator type device since 2008.  Original Report Authenticated By: Ulla Potash III, M.D.    Scheduled Meds:    . enoxaparin (LOVENOX) injection  40 mg Subcutaneous Daily  . fentaNYL  50 mcg Transdermal Q72H  . lidocaine   Topical Once  . metoprolol  5 mg Intravenous Q6H  . pantoprazole (PROTONIX) IV  40 mg Intravenous Q24H   Continuous Infusions:    . dextrose  5 % and 0.45% NaCl 100 mL/hr at 03/28/12 1721    Principal Problem:  *SBO (small bowel obstruction) Active Problems:  HYPERLIPIDEMIA  DEPRESSION  REFLEX SYMPATHETIC DYSTROPHY  MIGRAINES, HX OF  Diabetes mellitus type 2, uncontrolled  Primary hyperparathyroidism  Hypoparathyroidism    Time spent: 40 minutes    Miku Udall  [facility] Pager [phone]. If 8PM-8AM, please contact night-coverage at [email or web address], password [facility] 03/28/2012, 5:25 PM  LOS: 2 days

## 2012-03-29 ENCOUNTER — Inpatient Hospital Stay (HOSPITAL_COMMUNITY): Payer: Medicare Other

## 2012-03-29 DIAGNOSIS — K8000 Calculus of gallbladder with acute cholecystitis without obstruction: Secondary | ICD-10-CM

## 2012-03-29 DIAGNOSIS — D649 Anemia, unspecified: Secondary | ICD-10-CM

## 2012-03-29 MED ORDER — MORPHINE SULFATE ER 30 MG PO TBCR
90.0000 mg | EXTENDED_RELEASE_TABLET | Freq: Two times a day (BID) | ORAL | Status: DC
  Administered 2012-03-29: 90 mg via ORAL
  Filled 2012-03-29: qty 3

## 2012-03-29 MED ORDER — TEMAZEPAM 15 MG PO CAPS
15.0000 mg | ORAL_CAPSULE | Freq: Every evening | ORAL | Status: DC | PRN
  Administered 2012-03-29: 15 mg via ORAL
  Filled 2012-03-29: qty 1

## 2012-03-29 MED ORDER — PANTOPRAZOLE SODIUM 40 MG PO TBEC
80.0000 mg | DELAYED_RELEASE_TABLET | Freq: Every day | ORAL | Status: DC
  Administered 2012-03-29 – 2012-03-31 (×3): 80 mg via ORAL
  Filled 2012-03-29: qty 1
  Filled 2012-03-29 (×2): qty 2

## 2012-03-29 MED ORDER — METOPROLOL SUCCINATE ER 100 MG PO TB24
100.0000 mg | ORAL_TABLET | Freq: Every day | ORAL | Status: DC
  Administered 2012-03-29 – 2012-03-31 (×3): 100 mg via ORAL
  Filled 2012-03-29 (×3): qty 1

## 2012-03-29 MED ORDER — MORPHINE SULFATE ER 30 MG PO TBCR
60.0000 mg | EXTENDED_RELEASE_TABLET | Freq: Three times a day (TID) | ORAL | Status: DC
  Administered 2012-03-29 – 2012-03-31 (×7): 60 mg via ORAL
  Filled 2012-03-29 (×7): qty 2

## 2012-03-29 MED ORDER — MORPHINE SULFATE ER 80 MG PO CP24: 80.0000 mg | ORAL_CAPSULE | Freq: Two times a day (BID) | ORAL | Status: DC

## 2012-03-29 MED ORDER — SENNA 8.6 MG PO TABS
2.0000 | ORAL_TABLET | Freq: Every day | ORAL | Status: DC
  Administered 2012-03-29 – 2012-03-30 (×2): 17.2 mg via ORAL
  Filled 2012-03-29 (×3): qty 2

## 2012-03-29 NOTE — Progress Notes (Signed)
[facility] PROGRESS NOTE  Emma Munoz ZOX:[id number] DOB: 12-18-1965 DOA: 03/26/2012 PCP: Shelia Media, MD  Assessment/Plan: Principal Problem:  *SBO (small bowel obstruction) Active Problems:  HYPERLIPIDEMIA  DEPRESSION  REFLEX SYMPATHETIC DYSTROPHY  MIGRAINES, HX OF  Diabetes mellitus type 2, uncontrolled  Primary hyperparathyroidism  Hypoparathyroidism  1. Recurrent small bowel obstructions-status post remote cholecystectomy. The patient was placed NPO by the consultant surgeon. Old records from [facility] are available in the shadow chart. In the first 24 hours the patient's past flatus and stool. Repeat abdominal x-ray on August 17 showed resolved small bowel obstruction. The patient was started on clear liquids on August 17 2. Chronic pain syndrome on high-dose opiates - we have converted the MS Contin to fentanyl patch while the patient was n.p.o. on August 17 the long-acting morphine treatment was resumed orally. We have added Senokot daily to prevent constipation 3. Hypertension-resume the beta blocker on August 17  Code Status: full Family Communication: husband  Disposition Plan: home  Consultants:  surgery  Procedures:  CT abdomen and pelvis  Antibiotics:  none  HPI/Subjective: Continues to feel that the abdomen is distended, passing flatus   Objective: Filed Vitals:   03/28/12 1449 03/28/12 2338 03/29/12 0059 03/29/12 0549  BP: 170/110 180/135 149/101 161/111  Pulse: 82 81 74 64  Temp: 98.6 F (37 C) 98.5 F (36.9 C)  98 F (36.7 C)  TempSrc: Oral Oral  Oral  Resp: 16 18  18   Height:      Weight:      SpO2: 97% 100%  99%    Intake/Output Summary (Last 24 hours) at 03/29/12 1703 Last data filed at 03/29/12 0800  Gross per 24 hour  Intake   2665 ml  Output      0 ml  Net   2665 ml   Filed Weights   03/27/12 0234  Weight: 99.8 kg (220 lb 0.3 oz)    Exam:   General:  Alert and oriented x3  Cardiovascular: regular rate and  rhythm  Respiratory: clear to auscultation bilaterally  Abdomen: soft distended  Data Reviewed: Basic Metabolic Panel:  Lab 03/26/12 8119  NA 137  K 4.3  CL 104  CO2 22  GLUCOSE 106*  BUN 9  CREATININE 0.58  CALCIUM 10.8*  MG --  PHOS --   Liver Function Tests:  Lab 03/26/12 1401  AST 21  ALT 20  ALKPHOS 102  BILITOT 0.3  PROT 7.1  ALBUMIN 3.7   No results found for this basename: LIPASE:5,AMYLASE:5 in the last 168 hours No results found for this basename: AMMONIA:5 in the last 168 hours CBC:  Lab 03/26/12 1401  WBC 7.8  NEUTROABS 4.2  HGB 14.7  HCT 41.2  MCV 81.3  PLT 271   Cardiac Enzymes: No results found for this basename: CKTOTAL:5,CKMB:5,CKMBINDEX:5,TROPONINI:5 in the last 168 hours BNP (last 3 results) No results found for this basename: PROBNP:3 in the last 8760 hours CBG:  Lab 03/27/12 0306  GLUCAP 84    No results found for this or any previous visit (from the past 240 hour(s)).   Studies: Dg Abd 1 View  03/27/2012  *RADIOLOGY REPORT*  Clinical Data: The small bowel obstruction.  Abdominal distension and pain.  ABDOMEN - 1 VIEW  Comparison: Acute abdominal series 03/26/2012.  Findings: There is some residual oral contrast material is scattered throughout the bowel, predominantly within the colon at this time, related to recent CT of the abdomen and pelvis 03/26/2012.  There is a  paucity of distal colonic or rectal gas or stool (portions of the rectum are obscured by the urinary bladder which is filled with excrete contrast material).  No definite pathologic distension of small bowel is noted on today's examination.  No gross evidence of pneumoperitoneum on this single supine view.  An electronic device projects over the right side of the abdomen with leads extending cephalad, likely representing a spinal cord stimulator.  Surgical clips project over the right upper quadrant of the abdomen, consistent with prior cholecystectomy.  IMPRESSION: 1.   Nonspecific, nonobstructive bowel gas pattern. 2.  Additional incidental findings, as above.  Original Report Authenticated By: Florencia Reasons, M.D.   Ct Abdomen Pelvis W Contrast  03/26/2012  *RADIOLOGY REPORT*  Clinical Data: Abdominal pain.  Nausea and vomiting.  Constipation. The operative history including hysterectomy, cholecystectomy and cesarean section.  CT ABDOMEN AND PELVIS WITH CONTRAST  Technique:  Multidetector CT imaging of the abdomen and pelvis was performed following the standard protocol during bolus administration of intravenous contrast.  Contrast: OMNIPAQUE IOHEXOL 300 MG/ML  SOLN  Comparison: 05/01/2010.  Findings: Marked dependent atelectasis is present at the lung bases.  Lingular atelectasis or scarring.  Heart grossly appears normal.  Liver appears normal.  Cholecystectomy.  Mild atrophy of the pancreas.  Spleen normal.  Adrenal glands normal.  Normal renal enhancement and delayed excretion of contrast. Relative right renal atrophy.  Spinal stimulator noted in the right posterior flank soft tissues.  Small bowel an anastomosis in the right lower quadrant. There is decompressed small bowel adjacent to the anastomoses with mild dilation and fluid-filled small bowel proximally and partial small bowel obstruction although low grade may be present.  Tiny amount of free fluid in the pouch of [place].  Large stool burden is present in the sigmoid colon.  Liquid stool is present in the ascending colon, transverse and descending colon which is abnormal but nonspecific.  This may be related to enteric infection or obstipation in this patient with a prominent distal stool burden.  The urinary bladder appears within normal limits.  In the left adnexal region, there is an enlarging solid and cystic lesion measuring 53 mm x 87 mm. Craniocaudal extent is 47 mm.  This demonstrates a thickened rind of anterior soft tissue and debris evident thick central septation, greater than expected for  complex ovarian cyst and suspicious for ovarian neoplasm.  There is no inguinal adenopathy.  No iliac adenopathy is identified.  Right ovary appears within normal limits. Hysterectomy.  Delayed renal excretion of contrast appears normal. L4-L5 L5-S1 lumbar spondylosis.  Midline abdominal scar in the periumbilical region. Normal appendix identified in the right lower quadrant.  IMPRESSION: 1.  Liquid stool present in the colon with a large distal colonic stool burden.  Although nonspecific, these findings may be associated with enteric infection or obstipation. 2.  Right lower quadrant small bowel anastomosis with decompressed distal small bowel and mild dilation of small bowel proximally suggesting low grade partial small bowel obstruction. 3.  Left adnexal complex cystic mass measuring 53 mm x 87 mm x 47 mm.  6-week follow-up pelvic ultrasound recommended to assess for resolution.  If the mass has not resolved, surgical consultation recommended.  Original Report Authenticated By: Andreas Newport, M.D.   Dg Abd Acute W/chest  03/26/2012  *RADIOLOGY REPORT*  Clinical Data: 46 year old female with abdominal pain nausea vomiting.  Cholecystectomy in April.  ACUTE ABDOMEN SERIES (ABDOMEN 2 VIEW & CHEST 1 VIEW)  Comparison: 09/19/2006.  Findings:  New electrode leads extending from the abdomen into the neck.  Lower lung volumes.  Chronic increased interstitial markings at the lung bases have not significantly changed.  No pneumothorax or pneumoperitoneum.  Cardiac size at the upper limits of normal. Other mediastinal contours are within normal limits.  No pleural effusion.  Generator device projects over the right lower quadrant.  Evidence of retained stool in the colon.  Bowel gas in nondilated distal colon.  Occasional small bowel gas, no dilated loops are evident. No acute osseous abnormality identified.  IMPRESSION: 1. Nonobstructed bowel gas pattern, no free air. 2.  Placement of head or neck stimulator type device  since 2008.  Original Report Authenticated By: Harley Hallmark, M.D.    Scheduled Meds:    . enoxaparin (LOVENOX) injection  40 mg Subcutaneous Daily  . lidocaine   Topical Once  . metoprolol succinate  100 mg Oral Daily  . morphine  60 mg Oral Q8H  . pantoprazole  80 mg Oral Q1200  . senna  2 tablet Oral Daily  . DISCONTD: fentaNYL  50 mcg Transdermal Q72H  . DISCONTD: metoprolol  5 mg Intravenous Q6H  . DISCONTD: morphine  80 mg Oral BID  . DISCONTD: morphine  90 mg Oral Q12H  . DISCONTD: pantoprazole (PROTONIX) IV  40 mg Intravenous Q24H   Continuous Infusions:    . DISCONTD: dextrose 5 % and 0.45% NaCl 100 mL/hr at 03/29/12 0759    Principal Problem:  *SBO (small bowel obstruction) Active Problems:  HYPERLIPIDEMIA  DEPRESSION  REFLEX SYMPATHETIC DYSTROPHY  MIGRAINES, HX OF  Diabetes mellitus type 2, uncontrolled  Primary hyperparathyroidism  Hypoparathyroidism    Time spent: 40 minutes    Lorain Fettes  [facility] Pager [phone]. If 8PM-8AM, please contact night-coverage at [email or web address], password [facility] 03/29/2012, 5:03 PM  LOS: 3 days

## 2012-03-29 NOTE — Progress Notes (Signed)
Patient ID: Emma Munoz, female   DOB: [birth year], 46 y.o.   MRN: [id number]    Subjective: Continues to pass significant flatus.  No BM.  ? Some nausea.  Objective: Vital signs in last 24 hours: Temp:  [98 F (36.7 C)-98.6 F (37 C)] 98 F (36.7 C) (08/17 0549) Pulse Rate:  [64-82] 64  (08/17 0549) Resp:  [16-18] 18  (08/17 0549) BP: (149-180)/(101-135) 161/111 mmHg (08/17 0549) SpO2:  [97 %-100 %] 99 % (08/17 0549) Last BM Date: 03/27/12  Intake/Output from previous day: 08/16 0701 - 08/17 0700 In: 1900 [I.V.:1900] Out: -  Intake/Output this shift: Total I/O In: 1465 [I.V.:1465] Out: -   General appearance: alert, cooperative, appears stated age and no distress Abdomen: Obese, soft, slightly tender.  Slightly distended.   Extremities:  No edema.    Lab Results:   Basename 03/26/12 1401  WBC 7.8  HGB 14.7  HCT 41.2  PLT 271   BMET  Basename 03/26/12 1401  NA 137  K 4.3  CL 104  CO2 22  GLUCOSE 106*  BUN 9  CREATININE 0.58  CALCIUM 10.8*   PT/INR No results found for this basename: LABPROT:2,INR:2 in the last 72 hours ABG No results found for this basename: PHART:2,PCO2:2,PO2:2,HCO3:2 in the last 72 hours  Studies/Results: No results found.  Anti-infectives: Anti-infectives     Start     Dose/Rate Route Frequency Ordered Stop   03/27/12 0130   ciprofloxacin (CIPRO) IVPB 400 mg  Status:  Discontinued        400 mg 200 mL/hr over 60 Minutes Intravenous Every 12 hours 03/27/12 0123 03/27/12 1007   03/27/12 0130   metroNIDAZOLE (FLAGYL) IVPB 500 mg  Status:  Discontinued        500 mg 100 mL/hr over 60 Minutes Intravenous Every 8 hours 03/27/12 0123 03/27/12 1007          Assessment/Plan: s/p * No surgery found * 1. SBO (slowly resolving)  Clears.   Encourage ambulation Repeat xrays today.   SBFT Monday. Discussed with Dr. Lavera Guise  We will continue to follow     LOS: 3 days    [facility] 03/29/2012

## 2012-03-30 DIAGNOSIS — I10 Essential (primary) hypertension: Secondary | ICD-10-CM

## 2012-03-30 DIAGNOSIS — K3184 Gastroparesis: Secondary | ICD-10-CM

## 2012-03-30 MED ORDER — POLYETHYLENE GLYCOL 3350 17 G PO PACK
34.0000 g | PACK | Freq: Two times a day (BID) | ORAL | Status: DC
  Administered 2012-03-30 (×2): 34 g via ORAL
  Filled 2012-03-30 (×5): qty 2

## 2012-03-30 MED ORDER — PNEUMOCOCCAL VAC POLYVALENT 25 MCG/0.5ML IJ INJ
0.5000 mL | INJECTION | INTRAMUSCULAR | Status: AC
  Administered 2012-03-31: 0.5 mL via INTRAMUSCULAR
  Filled 2012-03-30: qty 0.5

## 2012-03-30 MED ORDER — MORPHINE SULFATE 30 MG PO TABS: 90.0000 mg | ORAL_TABLET | ORAL | Status: DC | PRN

## 2012-03-30 MED ORDER — LISINOPRIL 20 MG PO TABS
20.0000 mg | ORAL_TABLET | Freq: Every day | ORAL | Status: DC
  Administered 2012-03-30 – 2012-03-31 (×2): 20 mg via ORAL
  Filled 2012-03-30 (×2): qty 1

## 2012-03-30 NOTE — Progress Notes (Signed)
I have seen and examined the patient and agree with the assessment and plans.  Will check SBFT tomorrow to assess for small bowel strictures.  Wyndell Cardiff A. Magnus Ivan  MD, FACS

## 2012-03-30 NOTE — Progress Notes (Signed)
Patient ID: Emma Munoz, female   DOB: Mar 17, 1966, 46 y.o.   MRN: [id number] Patient ID: Emma Munoz, female   DOB: 08-17-1965, 46 y.o.   MRN: [id number]    Subjective: Continues to pass flatus.  No BM.  Some nausea in morning after meds, no emesis.  Objective: Vital signs in last 24 hours: Temp:  [98 F (36.7 C)-98.2 F (36.8 C)] 98 F (36.7 C) (08/18 0548) Pulse Rate:  [72-74] 72  (08/18 0548) Resp:  [18] 18  (08/18 0548) BP: (166-170)/(118-122) 166/122 mmHg (08/18 0548) SpO2:  [97 %-100 %] 97 % (08/18 0548) Last BM Date: 03/27/12  Intake/Output from previous day: 2023-04-10 0701 - 08/18 0700 In: 1465 [I.V.:1465] Out: -  Intake/Output this shift: Total I/O In: 240 [P.O.:240] Out: -   General appearance: alert, cooperative, appears stated age and no distress Abdomen: Obese, soft, slightly tender.  Slightly distended.  +BS flatus , no BM to date (per patient this is still within her normal bowel function pattern) Extremities:  No edema.     Lab Results:  No results found for this basename: WBC:2,HGB:2,HCT:2,PLT:2 in the last 72 hours BMET No results found for this basename: NA:2,K:2,CL:2,CO2:2,GLUCOSE:2,BUN:2,CREATININE:2,CALCIUM:2 in the last 72 hours PT/INR No results found for this basename: LABPROT:2,INR:2 in the last 72 hours ABG No results found for this basename: PHART:2,PCO2:2,PO2:2,HCO3:2 in the last 72 hours  Studies/Results: Dg Abd 2 Views  April 09, 2012  *RADIOLOGY REPORT*  Clinical Data: Abdominal pain  Abdomen:  Supine and upright  Comparison: March 27, 2012.  Findings: Contrast is present in the colon.  The bowel gas pattern is normal.  No obstruction or free air.  Stimulator is seen on the right. There are surgical clips in the right upper quadrant.  IMPRESSION: Nonspecific gas pattern.  Original Report Authenticated By: Arvin Collard. WOODRUFF III, M.D.    Anti-infectives: Anti-infectives     Start     Dose/Rate Route Frequency Ordered Stop   03/27/12  0130   ciprofloxacin (CIPRO) IVPB 400 mg  Status:  Discontinued        400 mg 200 mL/hr over 60 Minutes Intravenous Every 12 hours 03/27/12 0123 03/27/12 1007   03/27/12 0130   metroNIDAZOLE (FLAGYL) IVPB 500 mg  Status:  Discontinued        500 mg 100 mL/hr over 60 Minutes Intravenous Every 8 hours 03/27/12 0123 03/27/12 1007          Assessment/Plan: s/p * No surgery found * 1. SBO (slowly resolving)   Continue clears.    Encourage ambulation  SBFT Monday. Medicines notes are seen and appreciated.   We will continue to follow     LOS: 4 days    Emma Munoz 03/30/2012

## 2012-03-30 NOTE — Progress Notes (Signed)
[facility] PROGRESS NOTE  Emma Munoz VWU:[id number] DOB: Dec 31, 1965 DOA: 03/26/2012 PCP: Shelia Media, MD  Assessment/Plan: Principal Problem:  *SBO (small bowel obstruction) Active Problems:  HYPERLIPIDEMIA  DEPRESSION  REFLEX SYMPATHETIC DYSTROPHY  MIGRAINES, HX OF  Diabetes mellitus type 2, uncontrolled  Primary hyperparathyroidism  Hypoparathyroidism  1. Recurrent small bowel obstructions-status post remote cholecystectomy. The patient was placed NPO by the consultant surgeon. Old records from [facility] are available in the shadow chart. In the first 24 hours the patient's past flatus and stool. Repeat abdominal x-ray on August 17 showed resolved small bowel obstruction. The patient was started on clear liquids on August 17 and advanced to full liquid on 8/18. Plan fr upper Gi series on 8/19 2. Chronic pain syndrome on high-dose opiates - we have converted the MS Contin to fentanyl patch while the patient was n.p.o. On August 17 the long-acting morphine treatment was resumed orally. We have added Senokot daily to prevent constipation. MSIR for breakthrough ain was added on 8/18 3. Hypertension-resumed the beta blocker on August 17, resumed acei on 8/18  Code Status: full Family Communication: husband  Disposition Plan: home  Consultants:  surgery  Procedures:  CT abdomen and pelvis  Antibiotics:  none  HPI/Subjective: Continues to feel that the abdomen is distended, passing flatus   Objective: Filed Vitals:   03/29/12 0549 03/29/12 2211 03/29/12 2312 03/30/12 0548  BP: 161/111 167/118 170/121 166/122  Pulse: 64 74  72  Temp: 98 F (36.7 C) 98.2 F (36.8 C)  98 F (36.7 C)  TempSrc: Oral Oral  Oral  Resp: 18 18  18   Height:      Weight:      SpO2: 99% 100%  97%   No intake or output data in the 24 hours ending 03/30/12 0859 Filed Weights   03/27/12 0234  Weight: 99.8 kg (220 lb 0.3 oz)    Exam:   General:  Alert and oriented  x3  Cardiovascular: regular rate and rhythm  Respiratory: clear to auscultation bilaterally  Abdomen: soft distended  Data Reviewed: Basic Metabolic Panel:  Lab 03/26/12 2956  NA 137  K 4.3  CL 104  CO2 22  GLUCOSE 106*  BUN 9  CREATININE 0.58  CALCIUM 10.8*  MG --  PHOS --   Liver Function Tests:  Lab 03/26/12 1401  AST 21  ALT 20  ALKPHOS 102  BILITOT 0.3  PROT 7.1  ALBUMIN 3.7   No results found for this basename: LIPASE:5,AMYLASE:5 in the last 168 hours No results found for this basename: AMMONIA:5 in the last 168 hours CBC:  Lab 03/26/12 1401  WBC 7.8  NEUTROABS 4.2  HGB 14.7  HCT 41.2  MCV 81.3  PLT 271   Cardiac Enzymes: No results found for this basename: CKTOTAL:5,CKMB:5,CKMBINDEX:5,TROPONINI:5 in the last 168 hours BNP (last 3 results) No results found for this basename: PROBNP:3 in the last 8760 hours CBG:  Lab 03/27/12 0306  GLUCAP 84    No results found for this or any previous visit (from the past 240 hour(s)).   Studies: Dg Abd 1 View  03/27/2012  *RADIOLOGY REPORT*  Clinical Data: The small bowel obstruction.  Abdominal distension and pain.  ABDOMEN - 1 VIEW  Comparison: Acute abdominal series 03/26/2012.  Findings: There is some residual oral contrast material is scattered throughout the bowel, predominantly within the colon at this time, related to recent CT of the abdomen and pelvis 03/26/2012.  There is a paucity of distal colonic or  rectal gas or stool (portions of the rectum are obscured by the urinary bladder which is filled with excrete contrast material).  No definite pathologic distension of small bowel is noted on today's examination.  No gross evidence of pneumoperitoneum on this single supine view.  An electronic device projects over the right side of the abdomen with leads extending cephalad, likely representing a spinal cord stimulator.  Surgical clips project over the right upper quadrant of the abdomen, consistent with prior  cholecystectomy.  IMPRESSION: 1.  Nonspecific, nonobstructive bowel gas pattern. 2.  Additional incidental findings, as above.  Original Report Authenticated By: Florencia Reasons, M.D.   Ct Abdomen Pelvis W Contrast  03/26/2012  *RADIOLOGY REPORT*  Clinical Data: Abdominal pain.  Nausea and vomiting.  Constipation. The operative history including hysterectomy, cholecystectomy and cesarean section.  CT ABDOMEN AND PELVIS WITH CONTRAST  Technique:  Multidetector CT imaging of the abdomen and pelvis was performed following the standard protocol during bolus administration of intravenous contrast.  Contrast: OMNIPAQUE IOHEXOL 300 MG/ML  SOLN  Comparison: 05/01/2010.  Findings: Marked dependent atelectasis is present at the lung bases.  Lingular atelectasis or scarring.  Heart grossly appears normal.  Liver appears normal.  Cholecystectomy.  Mild atrophy of the pancreas.  Spleen normal.  Adrenal glands normal.  Normal renal enhancement and delayed excretion of contrast. Relative right renal atrophy.  Spinal stimulator noted in the right posterior flank soft tissues.  Small bowel an anastomosis in the right lower quadrant. There is decompressed small bowel adjacent to the anastomoses with mild dilation and fluid-filled small bowel proximally and partial small bowel obstruction although low grade may be present.  Tiny amount of free fluid in the pouch of [place].  Large stool burden is present in the sigmoid colon.  Liquid stool is present in the ascending colon, transverse and descending colon which is abnormal but nonspecific.  This may be related to enteric infection or obstipation in this patient with a prominent distal stool burden.  The urinary bladder appears within normal limits.  In the left adnexal region, there is an enlarging solid and cystic lesion measuring 53 mm x 87 mm. Craniocaudal extent is 47 mm.  This demonstrates a thickened rind of anterior soft tissue and debris evident thick central  septation, greater than expected for complex ovarian cyst and suspicious for ovarian neoplasm.  There is no inguinal adenopathy.  No iliac adenopathy is identified.  Right ovary appears within normal limits. Hysterectomy.  Delayed renal excretion of contrast appears normal. L4-L5 L5-S1 lumbar spondylosis.  Midline abdominal scar in the periumbilical region. Normal appendix identified in the right lower quadrant.  IMPRESSION: 1.  Liquid stool present in the colon with a large distal colonic stool burden.  Although nonspecific, these findings may be associated with enteric infection or obstipation. 2.  Right lower quadrant small bowel anastomosis with decompressed distal small bowel and mild dilation of small bowel proximally suggesting low grade partial small bowel obstruction. 3.  Left adnexal complex cystic mass measuring 53 mm x 87 mm x 47 mm.  6-week follow-up pelvic ultrasound recommended to assess for resolution.  If the mass has not resolved, surgical consultation recommended.  Original Report Authenticated By: Andreas Newport, M.D.   Dg Abd Acute W/chest  03/26/2012  *RADIOLOGY REPORT*  Clinical Data: 46 year old female with abdominal pain nausea vomiting.  Cholecystectomy in April.  ACUTE ABDOMEN SERIES (ABDOMEN 2 VIEW & CHEST 1 VIEW)  Comparison: 09/19/2006.  Findings: New electrode leads extending from  the abdomen into the neck.  Lower lung volumes.  Chronic increased interstitial markings at the lung bases have not significantly changed.  No pneumothorax or pneumoperitoneum.  Cardiac size at the upper limits of normal. Other mediastinal contours are within normal limits.  No pleural effusion.  Generator device projects over the right lower quadrant.  Evidence of retained stool in the colon.  Bowel gas in nondilated distal colon.  Occasional small bowel gas, no dilated loops are evident. No acute osseous abnormality identified.  IMPRESSION: 1. Nonobstructed bowel gas pattern, no free air. 2.  Placement  of head or neck stimulator type device since 2008.  Original Report Authenticated By: Harley Hallmark, M.D.    Scheduled Meds:    . enoxaparin (LOVENOX) injection  40 mg Subcutaneous Daily  . lidocaine   Topical Once  . lisinopril  20 mg Oral Daily  . metoprolol succinate  100 mg Oral Daily  . morphine  60 mg Oral Q8H  . pantoprazole  80 mg Oral Q1200  . pneumococcal 23 valent vaccine  0.5 mL Intramuscular Tomorrow-1000  . senna  2 tablet Oral Daily  . DISCONTD: morphine  80 mg Oral BID  . DISCONTD: morphine  90 mg Oral Q12H   Continuous Infusions:    Principal Problem:  *SBO (small bowel obstruction) Active Problems:  HYPERLIPIDEMIA  DEPRESSION  REFLEX SYMPATHETIC DYSTROPHY  MIGRAINES, HX OF  Diabetes mellitus type 2, uncontrolled  Primary hyperparathyroidism  Hypoparathyroidism    Time spent: 40 minutes    Jovan Colligan  [facility] Pager [phone]. If 8PM-8AM, please contact night-coverage at [email or web address], password [facility] 03/30/2012, 8:59 AM  LOS: 4 days

## 2012-03-31 ENCOUNTER — Inpatient Hospital Stay (HOSPITAL_COMMUNITY): Payer: Medicare Other

## 2012-03-31 MED ORDER — LINACLOTIDE 145 MCG PO CAPS: 145.0000 ug | ORAL_CAPSULE | Freq: Every day | ORAL | Status: DC

## 2012-03-31 MED ORDER — SENNA 8.6 MG PO TABS: 2.0000 | ORAL_TABLET | Freq: Every day | ORAL | Status: DC

## 2012-03-31 MED ORDER — POLYETHYLENE GLYCOL 3350 17 G PO PACK: 34.0000 g | PACK | Freq: Two times a day (BID) | ORAL | Status: DC

## 2012-03-31 NOTE — Progress Notes (Signed)
SBFT shows contrast into colon with normal transit time.  There is a stricture at the anastamosis but no evidence it is causing obstruction.  In light of the patient's symptoms, it may be worth GI doing endoscopy to see if the area looks narrow and possibly dilating it.  I D/W patient.  I believe Dr. Evette Cristal saw her here before. Patient examined and I agree with the assessment and plan  Violeta Gelinas, MD, MPH, FACS Pager: [phone]  03/31/2012 4:21 PM

## 2012-03-31 NOTE — Discharge Summary (Signed)
Physician Discharge Summary  AZLIN ZILBERMAN NWG:[id number] DOB: 09/02/1965 DOA: 03/26/2012  PCP: Shelia Media, MD  Admit date: 03/26/2012 Discharge date: 03/31/2012  Recommendations for Outpatient Follow-up:  1. Dr. Janee Morn from surgery recommended referral to tertiary center for enteroscopy / balloon dilatation   Discharge Diagnoses:  PSBO (small bowel obstruction) - resolved   HYPERLIPIDEMIA  DEPRESSION  REFLEX SYMPATHETIC DYSTROPHY  MIGRAINES, HX OF  Diabetes mellitus type 2, uncontrolled  Primary hyperparathyroidism  Hypoparathyroidism Constipation  Chronic pain Syndrome   Discharge Condition: good,   Diet recommendation: instructed to use a liquid diet   Filed Weights   03/27/12 0234  Weight: 99.8 kg (220 lb 0.3 oz)    History of present illness:  46 yo woman with recurrent sbo   Hospital Course:  1. Recurrent small bowel obstructions-status post remote cholecystectomy. The patient was placed NPO by the consultant surgeon on admission . Old records from [facility] were obtained and reviewed by myself and Dr. Donell Beers from [facility].  In the first 24 hours the patient passed flatus and stool. Repeat abdominal x-ray on August 17 showed resolved small bowel obstruction. The patient was started on clear liquids on August 17 and advanced to full liquid on 8/18. She underwent upper Gi series on 8/19 with finding of a short segment of small bowel that seemed narrowed but the contrast did go though without difficulty.  2. Chronic pain syndrome on high-dose opiates - we have converted the MS Contin to fentanyl patch while the patient was n.p.o. On August 17 the long-acting morphine treatment was resumed orally. We have added Senokot daily to prevent constipation. MSIR for breakthrough pain was added on 8/18 3. Hypertension-resumed the beta blocker on August 17, resumed acei on 8/18     Procedures:  CT  UGI  Consultations:  [facility]  Discharge Exam: Filed Vitals:   03/31/12 1302    BP: 164/115  Pulse: 90  Temp:   Resp:    Filed Vitals:   03/30/12 0548 03/30/12 2137 03/31/12 0446 03/31/12 1302  BP: 166/122 154/102 169/120 164/115  Pulse: 72 74 83 90  Temp: 98 F (36.7 C) 98.1 F (36.7 C) 97.9 F (36.6 C)   TempSrc: Oral Oral Oral   Resp: 18 18 18    Height:      Weight:      SpO2: 97% 97% 95%     General: axox3 Cardiovascular: rrr Respiratory: ctab Abdomen : distended mildly, soft, sounds diminished, minimal tenderness  Discharge Instructions  Discharge Orders    Future Orders Please Complete By Expires   Diet - low sodium heart healthy      Increase activity slowly        Medication List  As of 03/31/2012  5:03 PM   STOP taking these medications         freestyle lancets      glucose blood test strip         TAKE these medications         cyclobenzaprine 10 MG tablet   Commonly known as: FLEXERIL   Take 20 mg by mouth at bedtime.      esomeprazole 40 MG capsule   Commonly known as: NEXIUM   Take 40 mg by mouth daily.      Linaclotide 145 MCG Caps   Commonly known as: LINZESS   Take 1 capsule (145 mcg total) by mouth daily.      lisinopril 20 MG tablet   Commonly known as: PRINIVIL,ZESTRIL   Take  20 mg by mouth daily.      metoprolol succinate 100 MG 24 hr tablet   Commonly known as: TOPROL-XL   Take 100 mg by mouth daily. Take with or immediately following a meal.      morphine 80 MG 24 hr capsule   Commonly known as: KADIAN   Take 80 mg by mouth 2 (two) times daily.      morphine 30 MG tablet   Commonly known as: MSIR   Take 30 mg by mouth every 6 (six) hours as needed. For pain      polyethylene glycol packet   Commonly known as: MIRALAX / GLYCOLAX   Take 34 g by mouth 2 (two) times daily.      senna 8.6 MG Tabs   Commonly known as: SENOKOT   Take 2 tablets (17.2 mg total) by mouth daily.      Vitamin D (Ergocalciferol) 50000 UNITS Caps   Commonly known as: DRISDOL   Take 50,000 Units by mouth 2 (two) times a  week. Monday & Thursdays              The results of significant diagnostics from this hospitalization (including imaging, microbiology, ancillary and laboratory) are listed below for reference.    Significant Diagnostic Studies: Dg Abd 1 View  03/27/2012  *RADIOLOGY REPORT*  Clinical Data: The small bowel obstruction.  Abdominal distension and pain.  ABDOMEN - 1 VIEW  Comparison: Acute abdominal series 03/26/2012.  Findings: There is some residual oral contrast material is scattered throughout the bowel, predominantly within the colon at this time, related to recent CT of the abdomen and pelvis 03/26/2012.  There is a paucity of distal colonic or rectal gas or stool (portions of the rectum are obscured by the urinary bladder which is filled with excrete contrast material).  No definite pathologic distension of small bowel is noted on today's examination.  No gross evidence of pneumoperitoneum on this single supine view.  An electronic device projects over the right side of the abdomen with leads extending cephalad, likely representing a spinal cord stimulator.  Surgical clips project over the right upper quadrant of the abdomen, consistent with prior cholecystectomy.  IMPRESSION: 1.  Nonspecific, nonobstructive bowel gas pattern. 2.  Additional incidental findings, as above.  Original Report Authenticated By: Florencia Reasons, M.D.   Ct Abdomen Pelvis W Contrast  03/26/2012  *RADIOLOGY REPORT*  Clinical Data: Abdominal pain.  Nausea and vomiting.  Constipation. The operative history including hysterectomy, cholecystectomy and cesarean section.  CT ABDOMEN AND PELVIS WITH CONTRAST  Technique:  Multidetector CT imaging of the abdomen and pelvis was performed following the standard protocol during bolus administration of intravenous contrast.  Contrast: OMNIPAQUE IOHEXOL 300 MG/ML  SOLN  Comparison: 05/01/2010.  Findings: Marked dependent atelectasis is present at the lung bases.  Lingular  atelectasis or scarring.  Heart grossly appears normal.  Liver appears normal.  Cholecystectomy.  Mild atrophy of the pancreas.  Spleen normal.  Adrenal glands normal.  Normal renal enhancement and delayed excretion of contrast. Relative right renal atrophy.  Spinal stimulator noted in the right posterior flank soft tissues.  Small bowel an anastomosis in the right lower quadrant. There is decompressed small bowel adjacent to the anastomoses with mild dilation and fluid-filled small bowel proximally and partial small bowel obstruction although low grade may be present.  Tiny amount of free fluid in the pouch of [place].  Large stool burden is present in the sigmoid colon.  Liquid  stool is present in the ascending colon, transverse and descending colon which is abnormal but nonspecific.  This may be related to enteric infection or obstipation in this patient with a prominent distal stool burden.  The urinary bladder appears within normal limits.  In the left adnexal region, there is an enlarging solid and cystic lesion measuring 53 mm x 87 mm. Craniocaudal extent is 47 mm.  This demonstrates a thickened rind of anterior soft tissue and debris evident thick central septation, greater than expected for complex ovarian cyst and suspicious for ovarian neoplasm.  There is no inguinal adenopathy.  No iliac adenopathy is identified.  Right ovary appears within normal limits. Hysterectomy.  Delayed renal excretion of contrast appears normal. L4-L5 L5-S1 lumbar spondylosis.  Midline abdominal scar in the periumbilical region. Normal appendix identified in the right lower quadrant.  IMPRESSION: 1.  Liquid stool present in the colon with a large distal colonic stool burden.  Although nonspecific, these findings may be associated with enteric infection or obstipation. 2.  Right lower quadrant small bowel anastomosis with decompressed distal small bowel and mild dilation of small bowel proximally suggesting low grade partial  small bowel obstruction. 3.  Left adnexal complex cystic mass measuring 53 mm x 87 mm x 47 mm.  6-week follow-up pelvic ultrasound recommended to assess for resolution.  If the mass has not resolved, surgical consultation recommended.  Original Report Authenticated By: Andreas Newport, M.D.   Dg Abd 2 Views  03/29/2012  *RADIOLOGY REPORT*  Clinical Data: Abdominal pain  Abdomen:  Supine and upright  Comparison: March 27, 2012.  Findings: Contrast is present in the colon.  The bowel gas pattern is normal.  No obstruction or free air.  Stimulator is seen on the right. There are surgical clips in the right upper quadrant.  IMPRESSION: Nonspecific gas pattern.  Original Report Authenticated By: Arvin Collard. Margarita Grizzle III, M.D.   Dg Abd Acute W/chest  03/26/2012  *RADIOLOGY REPORT*  Clinical Data: 46 year old female with abdominal pain nausea vomiting.  Cholecystectomy in April.  ACUTE ABDOMEN SERIES (ABDOMEN 2 VIEW & CHEST 1 VIEW)  Comparison: 09/19/2006.  Findings: New electrode leads extending from the abdomen into the neck.  Lower lung volumes.  Chronic increased interstitial markings at the lung bases have not significantly changed.  No pneumothorax or pneumoperitoneum.  Cardiac size at the upper limits of normal. Other mediastinal contours are within normal limits.  No pleural effusion.  Generator device projects over the right lower quadrant.  Evidence of retained stool in the colon.  Bowel gas in nondilated distal colon.  Occasional small bowel gas, no dilated loops are evident. No acute osseous abnormality identified.  IMPRESSION: 1. Nonobstructed bowel gas pattern, no free air. 2.  Placement of head or neck stimulator type device since 2008.  Original Report Authenticated By: Harley Hallmark, M.D.   Varney Biles Kayleen Memos W/small Bowel  03/31/2012  *RADIOLOGY REPORT*  Clinical Data:Small bowel obstruction, small bowel stricture  UPPER GI W/ SMALL BOWEL  Technique: Upper GI series performed with high density barium and  effervescent agent. Thin barium also used.  Subsequently, serial images of the small bowel were obtained including spot views of the terminal ileum.  Fluoroscopy Time: 3.1 minutes  Comparison: 03/29/2012 and CT scan 03/26/2012  Findings: The esophagus shows normal contour, distensibility and peristalsis. Small to moderate gastroesophageal reflux was noted to the level of the mid-esophagus.  Stomach shows normal contour, distensibility and peristalsis.  No evidence of gastric outlet obstruction.  Duodenal  bulb and duodenal sweep are unremarkable.  Spot views and overheads were taken to evaluate the small bowel. The transit time is within normal limits. No definite evidence of small bowel obstruction. In the right lower quadrant terminal small bowel there is short segment of the narrowing of the lumen of the small bowel suspicious for stricture.  There is probable corresponds to the area of the anastomosis noted on recent CT scan. No significant small bowel distention proximal to this segment.  IMPRESSION:  1.  Small to moderate gastroesophageal reflux to the level of the mid-esophagus. No esophageal stricture. 2.  Unremarkable stomach and duodenal bulb. 3.  No definite evidence of small bowel obstruction.  There is a short segment of narrowing of the lumen of the terminal small bowel suspicious for a stricture.  Normal transit time.   Original Report Authenticated By: Natasha Mead, M.D.     Microbiology: No results found for this or any previous visit (from the past 240 hour(s)).   Labs: Basic Metabolic Panel:  Lab 03/26/12 1610  NA 137  K 4.3  CL 104  CO2 22  GLUCOSE 106*  BUN 9  CREATININE 0.58  CALCIUM 10.8*  MG --  PHOS --   Liver Function Tests:  Lab 03/26/12 1401  AST 21  ALT 20  ALKPHOS 102  BILITOT 0.3  PROT 7.1  ALBUMIN 3.7   No results found for this basename: LIPASE:5,AMYLASE:5 in the last 168 hours No results found for this basename: AMMONIA:5 in the last 168  hours CBC:  Lab 03/26/12 1401  WBC 7.8  NEUTROABS 4.2  HGB 14.7  HCT 41.2  MCV 81.3  PLT 271   Cardiac Enzymes: No results found for this basename: CKTOTAL:5,CKMB:5,CKMBINDEX:5,TROPONINI:5 in the last 168 hours BNP: BNP (last 3 results) No results found for this basename: PROBNP:3 in the last 8760 hours CBG:  Lab 03/27/12 0306  GLUCAP 84    Time coordinating discharge: 45 minutes  Signed:  Kelly Ranieri  [facility] 03/31/2012, 5:03 PM

## 2012-03-31 NOTE — Care Management Note (Signed)
    Page 1 of 1   03/31/2012     5:14:14 PM   CARE MANAGEMENT NOTE 03/31/2012  Patient:  Emma Munoz, Emma Munoz   Account Number:  [id number]  Date Initiated:  03/31/2012  Documentation initiated by:  Letha Cape  Subjective/Objective Assessment:   dx sbo  admit- lives with spouse, pta independent.     Action/Plan:   Anticipated DC Date:  03/31/2012   Anticipated DC Plan:  HOME/SELF CARE      DC Planning Services  CM consult      Choice offered to / List presented to:             Status of service:  Completed, signed off Medicare Important Message given?   (If response is "NO", the following Medicare IM given date fields will be blank) Date Medicare IM given:   Date Additional Medicare IM given:    Discharge Disposition:  HOME/SELF CARE  Per UR Regulation:  Reviewed for med. necessity/level of care/duration of stay  If discussed at Long Length of Stay Meetings, dates discussed:    Comments:  03/31/12 17:13 Letha Cape RN, BSN [phone] patient lives with spouse, pta independent,  patient has medication coverage and transportation.  No needs anticipated.

## 2012-03-31 NOTE — Progress Notes (Signed)
Patient discharge teaching given, including activity, diet, follow-up appoints, and medications. Patient verbalized understanding of all discharge instructions. IV access was d/c'd. Vitals are stable. Skin is intact except as charted in most recent assessments. Pt to be escorted out by NT, to be driven home by family. 

## 2012-03-31 NOTE — Progress Notes (Signed)
Subjective: Had a BM an hour ago, waiting on diet, full liquids yesterday.  Objective: Vital signs in last 24 hours: Temp:  [97.9 F (36.6 C)-98.1 F (36.7 C)] 97.9 F (36.6 C) (08/19 0446) Pulse Rate:  [74-90] 90  (08/19 1302) Resp:  [18] 18  (08/19 0446) BP: (154-169)/(102-120) 164/115 mmHg (08/19 1302) SpO2:  [95 %-97 %] 95 % (08/19 0446) Last BM Date: 03/27/12  PO:  1040 ml recorded, regular diet, afebrile, BP very high, 154/102 - 164/115  today at 1100 hrs. No labs MS Contin:180 mg yesterday, morphine 4 mg IV, UGI with SB follow thru:  1. Small to moderate gastroesophageal reflux to the level of the mid-esophagus. No esophageal stricture. 2. Unremarkable stomach and duodenal bulb. 3. No definite evidence of small bowel obstruction. There is a short segment of narrowing of the lumen of the terminal small bowel suspicious for a stricture. Normal transit time.    Intake/Output from previous day: 08/18 0701 - 08/19 0700 In: 1040 [P.O.:1040] Out: -  Intake/Output this shift:    General appearance: alert, cooperative, no distress and sitting up in bed cross legged, without discomfort. GI: comfortable till I layed her down and palpated her stomach. She then complained of significant discomfort.  Lab Results:  No results found for this basename: WBC:2,HGB:2,HCT:2,PLT:2 in the last 72 hours  BMET No results found for this basename: NA:2,K:2,CL:2,CO2:2,GLUCOSE:2,BUN:2,CREATININE:2,CALCIUM:2 in the last 72 hours PT/INR No results found for this basename: LABPROT:2,INR:2 in the last 72 hours   Lab 03/26/12 1401  AST 21  ALT 20  ALKPHOS 102  BILITOT 0.3  PROT 7.1  ALBUMIN 3.7     Lipase     Component Value Date/Time   LIPASE 14 10/23/2011 1631     Studies/Results: Dg Ugi W/small Bowel  03/31/2012  *RADIOLOGY REPORT*  Clinical Data:Small bowel obstruction, small bowel stricture  UPPER GI W/ SMALL BOWEL  Technique: Upper GI series performed with high density  barium and effervescent agent. Thin barium also used.  Subsequently, serial images of the small bowel were obtained including spot views of the terminal ileum.  Fluoroscopy Time: 3.1 minutes  Comparison: 03/29/2012 and CT scan 03/26/2012  Findings: The esophagus shows normal contour, distensibility and peristalsis. Small to moderate gastroesophageal reflux was noted to the level of the mid-esophagus.  Stomach shows normal contour, distensibility and peristalsis.  No evidence of gastric outlet obstruction.  Duodenal bulb and duodenal sweep are unremarkable.  Spot views and overheads were taken to evaluate the small bowel. The transit time is within normal limits. No definite evidence of small bowel obstruction. In the right lower quadrant terminal small bowel there is short segment of the narrowing of the lumen of the small bowel suspicious for stricture.  There is probable corresponds to the area of the anastomosis noted on recent CT scan. No significant small bowel distention proximal to this segment.  IMPRESSION:  1.  Small to moderate gastroesophageal reflux to the level of the mid-esophagus. No esophageal stricture. 2.  Unremarkable stomach and duodenal bulb. 3.  No definite evidence of small bowel obstruction.  There is a short segment of narrowing of the lumen of the terminal small bowel suspicious for a stricture.  Normal transit time.   Original Report Authenticated By: Natasha Mead, M.D.     Medications:    . enoxaparin (LOVENOX) injection  40 mg Subcutaneous Daily  . lidocaine   Topical Once  . lisinopril  20 mg Oral Daily  . metoprolol succinate  100  mg Oral Daily  . morphine  60 mg Oral Q8H  . pantoprazole  80 mg Oral Q1200  . pneumococcal 23 valent vaccine  0.5 mL Intramuscular Tomorrow-1000  . polyethylene glycol  34 g Oral BID  . senna  2 tablet Oral Daily    Assessment/Plan 1. Abdominal pain, secondary to low grade partial small bowel obstruction 2. S/p cholecystectomy with small  bowel injury requiring small bowel repair, now with multiple readmissions for SBO. 3. Chronic pain on high dose morphine at home. 4.Reflex sympathetic Dystrophy 5.Hx of Migraines 6.AODM type 2, Poor control 7. Hypertension with poor control 8. Ongoing tobacco use  Plan:  UGI study above, pt has been told on prior studies she has a narrowing.  She does not believe her symptoms are related to her Morphine use. She says she was on this for years prior to her surgery at [facility] without any difficulty.  Dr. Janee Morn will review studies and talk with her.       LOS: 5 days    Jachai Okazaki 03/31/2012

## 2012-04-02 ENCOUNTER — Ambulatory Visit (INDEPENDENT_AMBULATORY_CARE_PROVIDER_SITE_OTHER): Payer: Medicare Other | Admitting: Internal Medicine

## 2012-04-02 ENCOUNTER — Telehealth: Payer: Self-pay | Admitting: *Deleted

## 2012-04-02 ENCOUNTER — Encounter: Payer: Self-pay | Admitting: Internal Medicine

## 2012-04-02 VITALS — BP 140/86 | HR 72 | Ht 67.0 in | Wt 212.0 lb

## 2012-04-02 DIAGNOSIS — K56609 Unspecified intestinal obstruction, unspecified as to partial versus complete obstruction: Secondary | ICD-10-CM

## 2012-04-02 DIAGNOSIS — K929 Disease of digestive system, unspecified: Secondary | ICD-10-CM

## 2012-04-02 DIAGNOSIS — E213 Hyperparathyroidism, unspecified: Secondary | ICD-10-CM

## 2012-04-02 DIAGNOSIS — K9189 Other postprocedural complications and disorders of digestive system: Secondary | ICD-10-CM

## 2012-04-02 DIAGNOSIS — K589 Irritable bowel syndrome without diarrhea: Secondary | ICD-10-CM

## 2012-04-02 DIAGNOSIS — N9489 Other specified conditions associated with female genital organs and menstrual cycle: Secondary | ICD-10-CM

## 2012-04-02 MED ORDER — LINACLOTIDE 290 MCG PO CAPS: 1.0000 | ORAL_CAPSULE | Freq: Every day | ORAL | Status: DC

## 2012-04-02 NOTE — Progress Notes (Signed)
Subjective:    Patient ID: Emma Munoz, female    DOB: 06/02/1966, 46 y.o.   MRN: [id number]  HPI Emma Munoz is a 46 yo female with a somewhat complex PMH including diabetes, hypertension, GERD, IBS-constipation, chronic pain due to a right hand injury on chronic narcotics, and hyperparathyroidism with hypercalcemia who seen in follow-up. He was recently hospitalized and discharged on the 18th after being admitted with partial small bowel obstruction. She continues to suffer from frequent partial and near complete small bowel obstructions resulting in abdominal pain, distention, nausea and vomiting. This initially was occurring less frequently since her segmental small bowel resection in April 2013, but has occurred on a near weekly basis to some degree recently. Her chronic constipation, on the other hand is doing fairly well. She continues on LINZESS 290 mcg daily. This is significantly helped her constipation. Today she is not having abdominal pain nor nausea. She's had no fevers.  During hospitalization she underwent a CT scan on 8/14 and small bowel follow-through on 8/18 all showing transition point vs stricture in the small bowel, felt to be at the anastomosis  Review of Systems As per history of present illness, otherwise negative    Objective:   Physical Exam BP 140/86  Pulse 72  Ht 5\' 7"  (1.702 m)  Wt 212 lb (96.163 kg)  BMI 33.20 kg/m2 Constitutional: Well-developed and well-nourished. No distress. HEENT: Normocephalic and atraumatic. Oropharynx is clear and moist. No oropharyngeal exudate. Conjunctivae are normal.  No scleral icterus. Neck: Neck supple. Trachea midline. Cardiovascular: Normal rate, regular rhythm and intact distal pulses. No M/R/G Pulmonary/chest: Effort normal and breath sounds normal. No wheezing, rales or rhonchi. Abdominal: Soft, nontender, nondistended. Bowel sounds active throughout.  Extremities: no clubbing, cyanosis, or edema Lymphadenopathy:  No cervical adenopathy noted. Neurological: Alert and oriented to person place and time. Skin: Skin is warm and dry. No rashes noted. Psychiatric: Normal mood and affect. Behavior is normal.  CT ABDOMEN AND PELVIS WITH CONTRAST   Technique:  Multidetector CT imaging of the abdomen and pelvis was performed following the standard protocol during bolus administration of intravenous contrast.   Contrast: OMNIPAQUE IOHEXOL 300 MG/ML  SOLN   Comparison: 05/01/2010.   Findings: Marked dependent atelectasis is present at the lung bases.  Lingular atelectasis or scarring.  Heart grossly appears normal.   Liver appears normal.  Cholecystectomy.  Mild atrophy of the pancreas.  Spleen normal.  Adrenal glands normal.   Normal renal enhancement and delayed excretion of contrast. Relative right renal atrophy.  Spinal stimulator noted in the right posterior flank soft tissues.  Small bowel an anastomosis in the right lower quadrant. There is decompressed small bowel adjacent to the anastomoses with mild dilation and fluid-filled small bowel proximally and partial small bowel obstruction although low grade may be present.   Tiny amount of free fluid in the pouch of Ericson.  Large stool burden is present in the sigmoid colon.  Liquid stool is present in the ascending colon, transverse and descending colon which is abnormal but nonspecific.   This may be related to enteric infection or obstipation in this patient with a prominent distal stool burden.  The urinary bladder appears within normal limits.  In the left adnexal region, there is an enlarging solid and cystic lesion measuring 53 mm x 87 mm. Craniocaudal extent is 47 mm.  This demonstrates a thickened rind of anterior soft tissue and debris evident thick central septation, greater than expected for complex ovarian cyst  and suspicious for ovarian neoplasm.   There is no inguinal adenopathy.  No iliac adenopathy is identified.   Right ovary appears within normal limits. Hysterectomy.  Delayed renal excretion of contrast appears normal. L4-L5 L5-S1 lumbar spondylosis.  Midline abdominal scar in the periumbilical region. Normal appendix identified in the right lower quadrant.   IMPRESSION: 1.  Liquid stool present in the colon with a large distal colonic stool burden.  Although nonspecific, these findings may be associated with enteric infection or obstipation. 2.  Right lower quadrant small bowel anastomosis with decompressed distal small bowel and mild dilation of small bowel proximally suggesting low grade partial small bowel obstruction. 3.  Left adnexal complex cystic mass measuring 53 mm x 87 mm x 47 mm.  6-week follow-up pelvic ultrasound recommended to assess for resolution.  If the mass has not resolved, surgical consultation recommended. UPPER GI W/ SMALL BOWEL   Technique: Upper GI series performed with high density barium and effervescent agent. Thin barium also used.  Subsequently, serial images of the small bowel were obtained including spot views of the terminal ileum.   Fluoroscopy Time: 3.1 minutes   Comparison: 03/29/2012 and CT scan 03/26/2012   Findings: The esophagus shows normal contour, distensibility and peristalsis. Small to moderate gastroesophageal reflux was noted to the level of the mid-esophagus.   Stomach shows normal contour, distensibility and peristalsis.  No evidence of gastric outlet obstruction.  Duodenal bulb and duodenal sweep are unremarkable.   Spot views and overheads were taken to evaluate the small bowel. The transit time is within normal limits. No definite evidence of small bowel obstruction. In the right lower quadrant terminal small bowel there is short segment of the narrowing of the lumen of the small bowel suspicious for stricture.  There is probable corresponds to the area of the anastomosis noted on recent CT scan. No significant small bowel  distention proximal to this segment.   IMPRESSION:   1.  Small to moderate gastroesophageal reflux to the level of the mid-esophagus. No esophageal stricture. 2.  Unremarkable stomach and duodenal bulb. 3.  No definite evidence of small bowel obstruction.  There is a short segment of narrowing of the lumen of the terminal small bowel suspicious for a stricture.  Normal transit time.       Assessment & Plan:  46 yo female with a somewhat complex PMH including diabetes, hypertension, GERD, IBS-constipation, chronic pain due to a right hand injury on chronic narcotics, and hyperparathyroidism with hypercalcemia who seen in follow-up.  1. 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 It is apparent that this anastomosis is causing her issues with intermittent obstruction. I've discussed the radiology studies today with one of our radiologists, who feels the issue is in the mid bowel, possibly proximal ileum. This also is the area near the staple line of her anastomosis as seen on the CT scan and small bowel follow-through. I've also spoken with Dr. Evette Cristal in [place], [place] who reviewed the operative report and feels that the anastomosis is likely mid bowel, though the precise location is not indicated (surgery was done by Dr. Doristine Counter who is out of town currently).   Based on this location, I do not think enteroscopy with dilation is possible, at least in our practice. It is possible that she could be referred to [facility] for evaluation of spiral enteroscopy, but even if this area  could be reached it is unknown if just one dilation would alleviate her problems. Based on the location of this stricture, I feel she needs surgical evaluation for consideration of revision of her small bowel anastomosis. In the interim I recommended a very low residue diet in attempt to avoid  further obstruction.  2. IBS-C -- continue with LINZESS 290 mcg daily for now. At present this is not her biggest issue  3. Hyperparathyroidism/hypercalcemia -- workup is ongoing with endocrine and she may be facing a parathyroidectomy  4. Adnexal mass -- seen on recent CT and being followed serially by her gynecologist

## 2012-04-02 NOTE — Telephone Encounter (Signed)
Per Dr Rhea Belton, referral to Dr Dwain Sarna at [facility] for evaluation of intermittent Small Bowel Obstruction and concern for Small Bowel Anastomotic Stricture. Sent to [facility] [place] for an appt.

## 2012-04-02 NOTE — Patient Instructions (Addendum)
Continue current medications.    Dr. Rhea Belton will speak with Dr. Doristine Counter and be in touch with you

## 2012-04-03 ENCOUNTER — Telehealth: Payer: Self-pay | Admitting: *Deleted

## 2012-04-03 NOTE — Telephone Encounter (Signed)
Message copied by Florene Glen on Thu Apr 03, 2012  3:08 PM ------      Message from: Marnette Burgess      Created: Thu Apr 03, 2012  2:29 PM      Regarding: RE: Referral       Aram Beecham,            Yes she is, so the patient is now scheduled to see Dr. Romie Levee on 04/16/12 @ 1:30pm, arrive @ 1:00pm.              Thank Bonita Quin,      Elane Fritz      ----- Message -----         From: Linna Hoff, RN         Sent: 04/03/2012  10:51 AM           To: Marnette Burgess      Subject: RE: Referral                                             Elane Fritz, if the appt is still available, Dr Rhea Belton will take Dr Maisie Fus; she is a Manufacturing engineer, right? Thanks.      ----- Message -----         From: Marnette Burgess         Sent: 04/03/2012   8:48 AM           To: Linna Hoff, RN      Subject: Referral                                                 Patient is scheduled to see Dr. Emelia Loron on 04/28/12 @ 3:40pm, arrive @ 3:10pm.  This is the soonest available appt w/Dr. Dwain Sarna, we do have a new colorectal surgeon, Dr. Romie Levee, that has some availability on 04/16/12 if you are interested.      If you have any questions please call [phone].            Thank You,      Elane Fritz      ----- Message -----         From: Linna Hoff, RN         Sent: 04/02/2012   2:48 PM           To: Rollen Sox, can you get me an appt with Dr Dwain Sarna for intermittent small bowel obstruction and concern for small bowel anastomotic stricture? Thanks.

## 2012-04-03 NOTE — Telephone Encounter (Signed)
Unable to reach pt by either phone number; mailed her a letter with appt with Dr Romie Levee on 04/16/12 at 1:30pm.

## 2012-04-04 NOTE — Telephone Encounter (Signed)
Informed pt of her appt with Dr Maisie Fus at [facility] on 04/16/12 at 1:30pm; pt stated understanding.

## 2012-04-06 ENCOUNTER — Emergency Department (HOSPITAL_COMMUNITY)
Admission: EM | Admit: 2012-04-06 | Discharge: 2012-04-06 | Disposition: A | Discharge status: Home / Self Care | Payer: Medicare Other | Attending: Emergency Medicine | Admitting: Emergency Medicine

## 2012-04-06 ENCOUNTER — Encounter (HOSPITAL_COMMUNITY): Payer: Self-pay | Admitting: Emergency Medicine

## 2012-04-06 ENCOUNTER — Other Ambulatory Visit (HOSPITAL_COMMUNITY): Payer: Medicare Other

## 2012-04-06 ENCOUNTER — Emergency Department (HOSPITAL_COMMUNITY): Payer: Medicare Other

## 2012-04-06 ENCOUNTER — Telehealth: Payer: Self-pay | Admitting: Gastroenterology

## 2012-04-06 DIAGNOSIS — K5900 Constipation, unspecified: Secondary | ICD-10-CM | POA: Insufficient documentation

## 2012-04-06 DIAGNOSIS — K589 Irritable bowel syndrome without diarrhea: Secondary | ICD-10-CM | POA: Insufficient documentation

## 2012-04-06 DIAGNOSIS — R112 Nausea with vomiting, unspecified: Secondary | ICD-10-CM

## 2012-04-06 DIAGNOSIS — R109 Unspecified abdominal pain: Secondary | ICD-10-CM

## 2012-04-06 DIAGNOSIS — N200 Calculus of kidney: Secondary | ICD-10-CM | POA: Insufficient documentation

## 2012-04-06 DIAGNOSIS — Z9889 Other specified postprocedural states: Secondary | ICD-10-CM | POA: Insufficient documentation

## 2012-04-06 DIAGNOSIS — Z9089 Acquired absence of other organs: Secondary | ICD-10-CM | POA: Insufficient documentation

## 2012-04-06 DIAGNOSIS — G8929 Other chronic pain: Secondary | ICD-10-CM | POA: Insufficient documentation

## 2012-04-06 DIAGNOSIS — R10819 Abdominal tenderness, unspecified site: Secondary | ICD-10-CM | POA: Insufficient documentation

## 2012-04-06 HISTORY — DX: Irritable bowel syndrome, unspecified: K58.9

## 2012-04-06 LAB — COMPREHENSIVE METABOLIC PANEL
Alkaline Phosphatase: 112 U/L (ref 39–117)
BUN: 10 mg/dL (ref 6–23)
Chloride: 100 mEq/L (ref 96–112)
GFR calc Af Amer: >90 mL/min (ref >90)
Glucose, Bld: 108 mg/dL — ABNORMAL HIGH (ref 70–99)
Potassium: 3.9 mEq/L (ref 3.5–5.1)
Total Bilirubin: 0.4 mg/dL (ref 0.3–1.2)
Total Protein: 6.9 g/dL (ref 6.0–8.3)

## 2012-04-06 LAB — CBC WITH DIFFERENTIAL/PLATELET
Eosinophils Absolute: 0.2 10*3/uL (ref 0.0–0.7)
Hemoglobin: 14.4 g/dL (ref 12.0–15.0)
Lymphs Abs: 3 10*3/uL (ref 0.7–4.0)
MCH: 29.4 pg (ref 26.0–34.0)
Monocytes Relative: 6 % (ref 3–12)
Neutrophils Relative %: 57 % (ref 43–77)
RBC: 4.89 MIL/uL (ref 3.87–5.11)

## 2012-04-06 LAB — URINALYSIS, ROUTINE W REFLEX MICROSCOPIC
Ketones, ur: NEGATIVE mg/dL
Leukocytes, UA: NEGATIVE
Nitrite: NEGATIVE
Protein, ur: NEGATIVE mg/dL
Urobilinogen, UA: 0.2 mg/dL (ref 0.0–1.0)

## 2012-04-06 MED ORDER — IOHEXOL 300 MG/ML  SOLN
100.0000 mL | Freq: Once | INTRAMUSCULAR | Status: AC | PRN
  Administered 2012-04-06: 100 mL via INTRAVENOUS

## 2012-04-06 MED ORDER — IOHEXOL 300 MG/ML  SOLN
20.0000 mL | INTRAMUSCULAR | Status: AC
  Administered 2012-04-06: 20 mL via ORAL

## 2012-04-06 MED ORDER — METOCLOPRAMIDE HCL 10 MG PO TABS: 10.0000 mg | ORAL_TABLET | Freq: Four times a day (QID) | ORAL | Status: DC | PRN

## 2012-04-06 MED ORDER — SODIUM CHLORIDE 0.9 % IV SOLN
INTRAVENOUS | Status: DC
  Administered 2012-04-06: 04:00:00 via INTRAVENOUS

## 2012-04-06 MED ORDER — FENTANYL CITRATE 0.05 MG/ML IJ SOLN
50.0000 ug | INTRAMUSCULAR | Status: DC | PRN
  Administered 2012-04-06: 50 ug via INTRAVENOUS
  Filled 2012-04-06: qty 2

## 2012-04-06 MED ORDER — ONDANSETRON HCL 4 MG/2ML IJ SOLN
4.0000 mg | INTRAMUSCULAR | Status: AC | PRN
  Administered 2012-04-06 (×2): 4 mg via INTRAVENOUS
  Filled 2012-04-06 (×2): qty 2

## 2012-04-06 MED ORDER — ONDANSETRON 8 MG PO TBDP: ORAL_TABLET | ORAL | Status: AC

## 2012-04-06 NOTE — ED Notes (Signed)
Patient transported to X-ray 

## 2012-04-06 NOTE — ED Notes (Signed)
Patient had gallbladder removed in April; had history of sepsis due to a "knicked colon" during surgery.  Patient had part of colon removed due to infection; reports that she intermittently gets sick (nausea, vomiting, and abdominal pain) ever since removal of colon/intestines.  Patient reports that she just got out of the hospital five days ago; patient currently complaining of abdominal pain, nausea, and vomiting.  Patient reports being a pain management patient -- patient reports that she has not been able to keep the medication down due to nausea and vomiting.  Gastroenterologist MD told patient to come to the ED.

## 2012-04-06 NOTE — ED Provider Notes (Signed)
History     CSN: [id number]  Arrival date & time 04/06/12  0125   First MD Initiated Contact with Patient 04/06/12 0247      Chief Complaint  Patient presents with  . Abdominal Pain  . Nausea  . Emesis     HPI Pt was seen at 0305.  Per pt, c/o gradual onset and persistence of constant acute flair of her chronic abd "pain" for the past 6 months, worse over the past several days. Has been associated with several episodes of N/V.  Pt has hx of possible "colon stricture" s/p a previous surgery, and is due to see Surgeon Dr. Maisie Fus next month.  Pt also c/o acute flair of her chronic "pains" due to being unable to tolerate her usual narcotic pain meds without N/V.  States she is concerned about "going into withdrawal" from not taking her chronic narcotic pain meds. Denies diarrhea, no back pain, no CP/SOB, no fevers, no black or blood in stools or emesis.     Past Medical History  Diagnosis Date  . Diabetes mellitus   . Hypertension   . Tobacco abuse   . IBS (irritable bowel syndrome)   . Constipation     Past Surgical History  Procedure Date  . Laparoscopic hysterectomy 2005  . Cesarean section 85, 87, 95  . Lumbar disc surgery     x2  . Knee surgery     Realignment surgery  . Hand surgery   . Ulnar nerve repair   . Rotator cuff repair   . Nerve stimulator in lumbar spine   . Cholecystectomy     With complications  . Myomectomy   . Abdominal surgery     Family History  Problem Relation Age of Onset  . Early death Father 19    GUNSHOT  . Kidney disease Brother     s/p transplant  . Heart disease Maternal Grandmother     Heart defect  . Prostate cancer Maternal Grandfather     History  Substance Use Topics  . Smoking status: Current Everyday Smoker -- 1.0 packs/day for 20 years  . Smokeless tobacco: Never Used  . Alcohol Use: Yes     Rarely    Review of Systems ROS: Statement: All systems negative except as marked or noted in the HPI; Constitutional:  Negative for fever and chills. ; ; Eyes: Negative for eye pain, redness and discharge. ; ; ENMT: Negative for ear pain, hoarseness, nasal congestion, sinus pressure and sore throat. ; ; Cardiovascular: Negative for chest pain, palpitations, diaphoresis, dyspnea and peripheral edema. ; ; Respiratory: Negative for cough, wheezing and stridor. ; ; Gastrointestinal: +N/V, abd pain. Negative for diarrhea, blood in stool, hematemesis, jaundice and rectal bleeding. . ; ; Genitourinary: Negative for dysuria, flank pain and hematuria. ; ; Musculoskeletal: Negative for back pain and neck pain. Negative for swelling and trauma.; ; Skin: Negative for pruritus, rash, abrasions, blisters, bruising and skin lesion.; ; Neuro: Negative for headache, lightheadedness and neck stiffness. Negative for weakness, altered level of consciousness , altered mental status, extremity weakness, paresthesias, involuntary movement, seizure and syncope.     Allergies  Gadolinium derivatives; Aspirin; Dilaudid; Ibuprofen; Latex; and Nsaids  Home Medications   Current Outpatient Rx  Name Route Sig Dispense Refill  . CYCLOBENZAPRINE HCL 10 MG PO TABS Oral Take 20 mg by mouth at bedtime.    Marland Kitchen ESOMEPRAZOLE MAGNESIUM 40 MG PO CPDR Oral Take 40 mg by mouth daily.    Marland Kitchen LINACLOTIDE  290 MCG PO CAPS Oral Take 1 capsule by mouth daily. 30 capsule 3  . LISINOPRIL 20 MG PO TABS Oral Take 20 mg by mouth daily.    Marland Kitchen METOPROLOL SUCCINATE ER 100 MG PO TB24 Oral Take 100 mg by mouth daily. Take with or immediately following a meal.    . MORPHINE SULFATE ER 100 MG PO CP24 Oral Take 100 mg by mouth daily. pain    . MORPHINE SULFATE 30 MG PO TABS Oral Take 30 mg by mouth every 6 (six) hours as needed. For pain    . SENNA 8.6 MG PO TABS Oral Take 2 tablets (17.2 mg total) by mouth daily. 120 each   . VITAMIN D (ERGOCALCIFEROL) 50000 UNITS PO CAPS Oral Take 50,000 Units by mouth 2 (two) times a week. Monday & Thursdays      BP 144/98  Pulse 96  Temp  98.6 F (37 C) (Oral)  Resp 18  SpO2 94%  Physical Exam 0310: Physical examination:  Nursing notes reviewed; Vital signs and O2 SAT reviewed;  Constitutional: Well developed, Well nourished, Well hydrated, In no acute distress; Head:  Normocephalic, atraumatic; Eyes: EOMI, PERRL, No scleral icterus; ENMT: Mouth and pharynx normal, Mucous membranes moist; Neck: Supple, Full range of motion, No lymphadenopathy; Cardiovascular: Regular rate and rhythm, No murmur, rub, or gallop; Respiratory: Breath sounds clear & equal bilaterally, No rales, rhonchi, wheezes.  Speaking full sentences with ease, Normal respiratory effort/excursion; Chest: Nontender, Movement normal; Abdomen: Soft, +diffuse tenderness to palp.  No rebound or guarding. Nondistended, Normal bowel sounds;; Extremities: Pulses normal, No tenderness, No edema, No calf edema or asymmetry.; Neuro: AA&Ox3, Major CN grossly intact.  Speech clear. No gross focal motor or sensory deficits in extremities.; Skin: Color normal, Warm, Dry.   ED Course  Procedures   MDM  MDM Reviewed: previous chart, nursing note and vitals Reviewed previous: x-ray, labs and CT scan Interpretation: labs and x-ray     Results for orders placed during the hospital encounter of 04/06/12  CBC WITH DIFFERENTIAL      Component Value Range   WBC 8.9  4.0 - 10.5 K/uL   RBC 4.89  3.87 - 5.11 MIL/uL   Hemoglobin 14.4  12.0 - 15.0 g/dL   HCT 16.1  09.6 - 04.5 %   MCV 81.2  78.0 - 100.0 fL   MCH 29.4  26.0 - 34.0 pg   MCHC 36.3 (*) 30.0 - 36.0 g/dL   RDW 40.9  81.1 - 91.4 %   Platelets 246  150 - 400 K/uL   Neutrophils Relative 57  43 - 77 %   Neutro Abs 5.1  1.7 - 7.7 K/uL   Lymphocytes Relative 34  12 - 46 %   Lymphs Abs 3.0  0.7 - 4.0 K/uL   Monocytes Relative 6  3 - 12 %   Monocytes Absolute 0.6  0.1 - 1.0 K/uL   Eosinophils Relative 3  0 - 5 %   Eosinophils Absolute 0.2  0.0 - 0.7 K/uL   Basophils Relative 0  0 - 1 %   Basophils Absolute 0.0  0.0 - 0.1  K/uL  COMPREHENSIVE METABOLIC PANEL      Component Value Range   Sodium 134 (*) 135 - 145 mEq/L   Potassium 3.9  3.5 - 5.1 mEq/L   Chloride 100  96 - 112 mEq/L   CO2 27  19 - 32 mEq/L   Glucose, Bld 108 (*) 70 - 99 mg/dL  BUN 10  6 - 23 mg/dL   Creatinine, Ser 1.61  0.50 - 1.10 mg/dL   Calcium 09.6 (*) 8.4 - 10.5 mg/dL   Total Protein 6.9  6.0 - 8.3 g/dL   Albumin 3.6  3.5 - 5.2 g/dL   AST 25  0 - 37 U/L   ALT 24  0 - 35 U/L   Alkaline Phosphatase 112  39 - 117 U/L   Total Bilirubin 0.4  0.3 - 1.2 mg/dL   GFR calc non Af Amer >90  >90 mL/min   GFR calc Af Amer >90  >90 mL/min  URINALYSIS, ROUTINE W REFLEX MICROSCOPIC      Component Value Range   Color, Urine YELLOW  YELLOW   APPearance CLEAR  CLEAR   Specific Gravity, Urine 1.014  1.005 - 1.030   pH 5.5  5.0 - 8.0   Glucose, UA NEGATIVE  NEGATIVE mg/dL   Hgb urine dipstick NEGATIVE  NEGATIVE   Bilirubin Urine NEGATIVE  NEGATIVE   Ketones, ur NEGATIVE  NEGATIVE mg/dL   Protein, ur NEGATIVE  NEGATIVE mg/dL   Urobilinogen, UA 0.2  0.0 - 1.0 mg/dL   Nitrite NEGATIVE  NEGATIVE   Leukocytes, UA NEGATIVE  NEGATIVE  LIPASE, BLOOD      Component Value Range   Lipase 11  11 - 59 U/L  PREGNANCY, URINE      Component Value Range   Preg Test, Ur NEGATIVE  NEGATIVE   Dg Abd Acute W/chest 04/06/2012  *RADIOLOGY REPORT*  Clinical Data: Abdominal pain  ACUTE ABDOMEN SERIES (ABDOMEN 2 VIEW & CHEST 1 VIEW)  Comparison: 03/31/2012 barium examination, 03/29/2012 radiograph  Findings: Right pelvic battery pack with stimulator wires extending up the chest into the neck, incompletely imaged.  Mild infrahilar opacities.  Nonobstructive bowel gas pattern.  Contrast from recent examination within the colon.  Surgical clips right upper quadrant.  No acute osseous finding.  IMPRESSION: Nonobstructive bowel gas pattern.  Contrast within the colon.  Mild bibasilar opacities; atelectasis versus infiltrate.   Original Report Authenticated By: Waneta Martins, M.D.    Dg Kayleen Memos W/small Bowel 03/31/2012  *RADIOLOGY REPORT*  Clinical Data:Small bowel obstruction, small bowel stricture  UPPER GI W/ SMALL BOWEL  Technique: Upper GI series performed with high density barium and effervescent agent. Thin barium also used.  Subsequently, serial images of the small bowel were obtained including spot views of the terminal ileum.  Fluoroscopy Time: 3.1 minutes  Comparison: 03/29/2012 and CT scan 03/26/2012  Findings: The esophagus shows normal contour, distensibility and peristalsis. Small to moderate gastroesophageal reflux was noted to the level of the mid-esophagus.  Stomach shows normal contour, distensibility and peristalsis.  No evidence of gastric outlet obstruction.  Duodenal bulb and duodenal sweep are unremarkable.  Spot views and overheads were taken to evaluate the small bowel. The transit time is within normal limits. No definite evidence of small bowel obstruction. In the right lower quadrant terminal small bowel there is short segment of the narrowing of the lumen of the small bowel suspicious for stricture.  There is probable corresponds to the area of the anastomosis noted on recent CT scan. No significant small bowel distention proximal to this segment.  IMPRESSION:  1.  Small to moderate gastroesophageal reflux to the level of the mid-esophagus. No esophageal stricture. 2.  Unremarkable stomach and duodenal bulb. 3.  No definite evidence of small bowel obstruction.  There is a short segment of narrowing of the lumen of the terminal  small bowel suspicious for a stricture.  Normal transit time.   Original Report Authenticated By: Natasha Mead, M.D.       0800:  CT A/P pending.  No acute SBO on AXR.  Pt has tol PO well while in the ED without N/V.  She has ambulated to the bathroom with steady gait, easy resps.  VS have remained stable, no signs of "narcotic withdrawal" that pt is very concerned about.  Sign out to Dr. Fonnie Jarvis.       Laray Anger, DO 04/06/12 [place]

## 2012-04-06 NOTE — ED Notes (Signed)
Pt. Went outside to call her husband.

## 2012-04-06 NOTE — Telephone Encounter (Signed)
On call note. Pt calls with recurrent N/V worsening over past 1-2 days and not controlled with Zofran. Dr. Lauro Franklin recent comprehensive office note was reviewed. She denies abd pain, F/C, hematemesis, rectal bleeding. Advised to take clear liquids for the next 12-24 hours, continue Zofran. If abd pain develops or N/V does not improve she should seek care at the closest ED to evaluate for a recurrent SBO or other problem.

## 2012-04-06 NOTE — ED Provider Notes (Signed)
Sleeping well in ED feels ready for discharge, minimally tender diffuse abd.  Hurman Horn, MD 04/10/12 [id number]

## 2012-04-06 NOTE — ED Notes (Addendum)
Into administer meds, pt says she does not think the Fentanyl dose is going to be enough to help her with withdrawls (because she has been vomiting) from her pain meds. Discussed with pt that we will start with this dose and reassess.  CT notified that pt has completed her oral contrast dose.

## 2012-04-07 ENCOUNTER — Telehealth: Payer: Self-pay | Admitting: Internal Medicine

## 2012-04-07 MED ORDER — HYOSCYAMINE SULFATE 0.125 MG SL SUBL: 0.1250 mg | SUBLINGUAL_TABLET | SUBLINGUAL | Status: DC | PRN

## 2012-04-07 MED ORDER — PROMETHAZINE HCL 25 MG RE SUPP
25.0000 mg | Freq: Four times a day (QID) | RECTAL | Status: DC | PRN
Start: 2012-04-07 — End: 2012-04-16

## 2012-04-07 NOTE — Telephone Encounter (Signed)
Pt had a bad experience in the ER when she went there for N/V;  she was encouraged to call [phone] " PT EXPERIENCE " TO SPEAK WITH SOMEONE. She reports she got nausea then starting vomiting and everything just kind of escalated until her system was out of control. She got fluids and an antiemetic I assume and was discharged. Informed her today per Dr Rhea Belton, to try to stay on a liquid diet as best she can. She states she had trouble getting her meds in d/t to nausea, so I offered Phenergan suppositories, but cautioned her they may make her sleepy. She stated understanding. She did ask if we could order something for stomach/abdominal spasms- not pain meds she states! Informed her I will ask Dr Rhea Belton, but he did say she didn't need anything to slow her gut down. Pt stated understanding. Spoke with Dr Rhea Belton, who stated it's OK to order Levsin for pt. Ordered and informed pt.

## 2012-04-16 ENCOUNTER — Encounter (INDEPENDENT_AMBULATORY_CARE_PROVIDER_SITE_OTHER): Payer: Self-pay | Admitting: General Surgery

## 2012-04-16 ENCOUNTER — Ambulatory Visit (INDEPENDENT_AMBULATORY_CARE_PROVIDER_SITE_OTHER): Payer: Medicare Other | Admitting: General Surgery

## 2012-04-16 VITALS — BP 158/100 | HR 78 | Temp 97.8°F | Ht 67.0 in | Wt 216.2 lb

## 2012-04-16 DIAGNOSIS — Z01818 Encounter for other preprocedural examination: Secondary | ICD-10-CM

## 2012-04-16 DIAGNOSIS — K56609 Unspecified intestinal obstruction, unspecified as to partial versus complete obstruction: Secondary | ICD-10-CM

## 2012-04-16 DIAGNOSIS — K56600 Partial intestinal obstruction, unspecified as to cause: Secondary | ICD-10-CM

## 2012-04-16 LAB — CBC
MCH: 28.2 pg (ref 26.0–34.0)
MCV: 84 fL (ref 78.0–100.0)
Platelets: 279 10*3/uL (ref 150–400)
RDW: 14.9 % (ref 11.5–15.5)

## 2012-04-16 NOTE — Patient Instructions (Addendum)
You have a partial small bowel obstruction.  I have recommended a resection of your small bowel anastomosis.  You will be admitted to the hospital for about 5-7 days afterwards.  I need you to see your medical doctor and get cleared for surgery.  We will check your lab work and evaluate your calcium level, as well as check your nutrition status.

## 2012-04-16 NOTE — Progress Notes (Addendum)
Chief Complaint  Patient presents with  . Pre-op Exam    eval SBO    HISTORY: Emma Munoz is a 46 y.o. female who presents to the office s/p a cholecystectomy complicated by a contained small bowel perforation.  This was identified and repaired on POD 3.  Since then, she has had intermittent nausea, vomiting and abd pain. Of note, she also has hyperparathyroidism and is currently being followed for this.  She states that the episodes are getting closer together and she can barely tolerate liquids.  She gets abdominal pain and nausea shortly after she eats.  She has lost 15-30lbs.  She has been seen by GI and it was felt that a resection would be in her best interest.  Past Medical History  Diagnosis Date  . Diabetes mellitus   . Hypertension   . Tobacco abuse   . IBS (irritable bowel syndrome)   . Constipation     Past Surgical History  Procedure Date  . Laparoscopic hysterectomy 2005  . Cesarean section 85, 87, 95  . Lumbar disc surgery     x2  . Knee surgery     Realignment surgery  . Hand surgery   . Ulnar nerve repair   . Rotator cuff repair   . Nerve stimulator in lumbar spine   . Cholecystectomy     With complications  . Myomectomy   . Abdominal surgery     Current Outpatient Prescriptions  Medication Sig Dispense Refill  . cyclobenzaprine (FLEXERIL) 10 MG tablet Take 20 mg by mouth at bedtime.      . esomeprazole (NEXIUM) 40 MG capsule Take 40 mg by mouth daily.      . hyoscyamine (LEVSIN SL) 0.125 MG SL tablet Place 1 tablet (0.125 mg total) under the tongue every 4 (four) hours as needed for cramping.  30 tablet  0  . Linaclotide (LINZESS) 290 MCG CAPS Take 1 capsule by mouth daily.  30 capsule  3  . lisinopril (PRINIVIL,ZESTRIL) 20 MG tablet Take 20 mg by mouth daily.      . metoprolol succinate (TOPROL-XL) 100 MG 24 hr tablet Take 100 mg by mouth daily. Take with or immediately following a meal.      . morphine (KADIAN) 100 MG 24 hr capsule Take 100 mg by  mouth daily. pain      . morphine (MSIR) 30 MG tablet Take 30 mg by mouth every 6 (six) hours as needed. For pain      . promethazine (PHENERGAN) 25 MG suppository Place 25 mg rectally every 6 (six) hours as needed.      . Vitamin D, Ergocalciferol, (DRISDOL) 50000 UNITS CAPS Take 50,000 Units by mouth 2 (two) times a week. Monday & Thursdays      . DISCONTD: promethazine (PHENERGAN) 25 MG suppository Place 1 suppository (25 mg total) rectally every 6 (six) hours as needed for nausea.  12 each  1     Allergies  Allergen Reactions  . Gadolinium Derivatives     Pt states nausea and vomiting to GAD in MRI pt states she does ok with iv/cm in cat scan  . Aspirin     REACTION: Tongue, throat, extremities swell  . Dilaudid (Hydromorphone Hcl) Nausea And Vomiting  . Ibuprofen     REACTION: Tongue, throat, extremities swell  . Latex Hives  . Nsaids     REACTION: Tongue, throat, extremities swell     Family History  Problem Relation Age of Onset  .   Early death Father 51    GUNSHOT  . Kidney disease Brother     s/p transplant  . Heart disease Maternal Grandmother     Heart defect  . Prostate cancer Maternal Grandfather   . Cancer Maternal Grandfather     prostate     History   Social History  . Marital Status: Married    Spouse Name: N/A    Number of Children: 3  . Years of Education: N/A   Occupational History  . Retired    Social History Main Topics  . Smoking status: Current Everyday Smoker -- 1.0 packs/day for 20 years    Types: Cigarettes  . Smokeless tobacco: Never Used  . Alcohol Use: Yes     Rarely  . Drug Use: No  . Sexually Active: None   Other Topics Concern  . None   Social History Narrative   Lives with husband and 17YO daughter. 2 dogs. Disabled after left arm injury.   REVIEW OF SYSTEMS - PERTINENT POSITIVES ONLY: Review of Systems - General ROS: positive for  - malaise and weight loss negative for - fever or night sweats Psychological ROS:  negative for - hallucinations, irritability or memory difficulties Hematological and Lymphatic ROS: negative for - bleeding problems, bruising, jaundice, night sweats or swollen lymph nodes Endocrine ROS: negative for - malaise/lethargy, palpitations or temperature intolerance Respiratory ROS: no cough, shortness of breath, or wheezing Cardiovascular ROS: no chest pain or dyspnea on exertion Gastrointestinal ROS: positive for - abdominal pain, appetite loss and nausea and vomting Negative for - blood in stools, constipation or diarrhea Genito-Urinary ROS: no dysuria, trouble voiding, or hematuria Neurological ROS: no TIA or stroke symptoms positive for - weakness of R hand   EXAM: Filed Vitals:   04/16/12 1342  BP: 158/100  Pulse: 78  Temp: 97.8 F (36.6 C)    Gen:  No acute distress.  Well nourished and well groomed.   Neurological: Alert and oriented to person, place, and time. Coordination normal.  Head: Normocephalic and atraumatic.  Eyes: Conjunctivae are normal. Pupils are equal, round, and reactive to light. No scleral icterus.  Neck: Normal range of motion. Neck supple. No tracheal deviation or thyromegaly present.  No cervical lymphadenopathy. Cardiovascular: Normal rate, regular rhythm, normal heart sounds and intact distal pulses.  Exam reveals no gallop and no friction rub.  No murmur heard. Respiratory: Effort normal.  No respiratory distress. No chest wall tenderness. Breath sounds normal.  No wheezes, rales or rhonchi.  GI: Soft. Slightly distended.  Tender mass in RUQ.  Incisional hernia. Musculoskeletal: Normal range of motion. Extremities are nontender.  Skin: Skin is warm and dry. No rash noted. No diaphoresis. No erythema. No pallor. No clubbing, cyanosis, or edema.   Psychiatric: Normal mood and affect. Behavior is normal. Judgment and thought content normal.    LABORATORY RESULTS: Available labs are reviewed    RADIOLOGY RESULTS:   Images and reports are  reviewed. CT abd shows some narrowing at the terminal ileum.  The oral contrast stops at her anastomosis. UGI with SBFT IMPRESSION:  1. Small to moderate gastroesophageal reflux to the level of the mid-esophagus. No esophageal stricture.  2. Unremarkable stomach and duodenal bulb.  3. No definite evidence of small bowel obstruction. There is a short segment of narrowing of the lumen of the terminal small bowel suspicious for a stricture. Normal transit time.      ASSESSMENT AND PLAN: Terricka C Chrostowski is a 46 y.o. female who   presents to me with abdominal pain, nausea and vomiting.  I am concerned that she has a blackage/stricture at her anastomosis.  She could also have an intussusception which could cause her symptoms and essentially negative imaging.  Given the finding of a tender RUQ mass and these symptoms, I recommended that she undergo an ex lap and small bowel resection.  I explained to her the risk and benefits of the surgery.  I told her that I didn't think there were any more tests or procedures that would give us a better picture.  I am concerned about her hypertension, but she tells me that she is off her medications.  I will send her to her PCP, Dr Walker, for medical clearance and BP control.  I have also ordered some labwork to evaluate her chemistries and her albumin levels.  We will schedule her for inpatient surgery ASAP.  I told her I would repair her hernia primarily during her surgery and we discussed the risks and benefits of various repairs.  She understands that there is a increased recurrence rate with this repair.    Zev Blue C Elijahjames Fuelling, MD Colon and Rectal Surgery / General Surgery [facility]      Visit Diagnoses: 1. Partial small bowel obstruction   2. Pre-op evaluation     Primary Care Physician: WALKER,JENNIFER A, MD    

## 2012-04-17 ENCOUNTER — Encounter (INDEPENDENT_AMBULATORY_CARE_PROVIDER_SITE_OTHER): Payer: Self-pay

## 2012-04-17 ENCOUNTER — Telehealth: Payer: Self-pay | Admitting: Internal Medicine

## 2012-04-17 LAB — COMPREHENSIVE METABOLIC PANEL
ALT: 17 U/L (ref 0–35)
AST: 17 U/L (ref 0–37)
CO2: 28 mEq/L (ref 19–32)
Creat: 0.62 mg/dL (ref 0.50–1.10)
Total Bilirubin: 0.4 mg/dL (ref 0.3–1.2)

## 2012-04-17 NOTE — Telephone Encounter (Signed)
Pt called she need surgical clearance to have surgery next week with dr Lajean Silvius @ [facility] in [place] Her bp was high when she went to see dr

## 2012-04-17 NOTE — Telephone Encounter (Signed)
Erie Noe spoke with patient and she has an appt with Dr. Dan Humphreys on 04/21/2012.

## 2012-04-18 ENCOUNTER — Encounter: Payer: Self-pay | Admitting: Cardiovascular Disease

## 2012-04-18 ENCOUNTER — Ambulatory Visit (INDEPENDENT_AMBULATORY_CARE_PROVIDER_SITE_OTHER): Payer: Medicare Other | Admitting: Cardiovascular Disease

## 2012-04-18 VITALS — BP 170/98 | HR 98 | Ht 67.0 in | Wt 212.2 lb

## 2012-04-18 DIAGNOSIS — R0609 Other forms of dyspnea: Secondary | ICD-10-CM

## 2012-04-18 DIAGNOSIS — R0600 Dyspnea, unspecified: Secondary | ICD-10-CM

## 2012-04-18 DIAGNOSIS — I10 Essential (primary) hypertension: Secondary | ICD-10-CM

## 2012-04-18 DIAGNOSIS — Z01810 Encounter for preprocedural cardiovascular examination: Secondary | ICD-10-CM

## 2012-04-18 NOTE — Patient Instructions (Addendum)
Your physician has requested that you have an echocardiogram. Echocardiography is a painless test that uses sound waves to create images of your heart. It provides your doctor with information about the size and shape of your heart and how well your heart's chambers and valves are working. This procedure takes approximately one hour. There are no restrictions for this procedure.  Take your blood pressure medications everyday.  Nurse visit on day of echo to check blood pressure.

## 2012-04-20 ENCOUNTER — Encounter: Payer: Self-pay | Admitting: Cardiovascular Disease

## 2012-04-20 NOTE — Assessment & Plan Note (Signed)
The patient had a slightly abnormal ECG but overall with nonspecific findings. She has no symptoms suggestive of angina. Her only symptoms include mild exertional dyspnea she is recovering from multiple surgeries.  She should reconsidered at an overall low to moderate risk for cardiovascular complications and can proceed with the surgery. However, I would like to obtain an echocardiogram before her surgery to ensure normal LV systolic function. I also asked her to take her blood pressure medications regularly. I will bring her for a nurse visit one day before the surgery. Both the echo and a nurse visit will be scheduled on Tuesday before the surgery.

## 2012-04-20 NOTE — Progress Notes (Signed)
HPI  This is a 46 year old African American female who is here today for preoperative cardiovascular evaluation for bowel surgery to be done on Tuesday. She was seen by me back in April for preoperative cardiovascular evaluation for cholecystectomy. She was noted to have a slightly abnormal ECG at that time but had no convincing cardiac symptoms. Thus, she was deemed to be an overall low to moderate risk for cardiovascular complications. She has history of type 2 diabetes ,hypertension and tobacco use. Head cholecystectomy was complicated by bowel injury which required another surgery. It appears that she is now suffering from after small bowel obstruction. She denies any chest pain but does complain of mild exertional dyspnea. Her blood pressure has been running high but she has not taken her blood pressure medications over the last few days.  Allergies  Allergen Reactions  . Gadolinium Derivatives     Pt states nausea and vomiting to GAD in MRI pt states she does ok with iv/cm in cat scan  . Aspirin     REACTION: Tongue, throat, extremities swell  . Dilaudid (Hydromorphone Hcl) Nausea And Vomiting  . Dye Fdc Red (Red Dye)   . Ibuprofen     REACTION: Tongue, throat, extremities swell  . Latex Hives  . Nsaids     REACTION: Tongue, throat, extremities swell     Current Outpatient Prescriptions on File Prior to Visit  Medication Sig Dispense Refill  . cyclobenzaprine (FLEXERIL) 10 MG tablet Take 20 mg by mouth at bedtime.      Marland Kitchen esomeprazole (NEXIUM) 40 MG capsule Take 40 mg by mouth daily.      . hyoscyamine (LEVSIN SL) 0.125 MG SL tablet Place 1 tablet (0.125 mg total) under the tongue every 4 (four) hours as needed for cramping.  30 tablet  0  . lisinopril (PRINIVIL,ZESTRIL) 20 MG tablet Take 20 mg by mouth daily.      . metoprolol succinate (TOPROL-XL) 100 MG 24 hr tablet Take 100 mg by mouth daily. Take with or immediately following a meal.      . morphine (KADIAN) 100 MG 24 hr  capsule Take 100 mg by mouth daily. pain      . morphine (MSIR) 30 MG tablet Take 30 mg by mouth every 6 (six) hours as needed. For pain      . promethazine (PHENERGAN) 25 MG suppository Place 25 mg rectally every 6 (six) hours as needed.         Past Medical History  Diagnosis Date  . Diabetes mellitus   . Hypertension   . Tobacco abuse   . IBS (irritable bowel syndrome)   . Constipation   . Hyperthyroidism   . Heart murmur   . Hyperglycemia      Past Surgical History  Procedure Date  . Laparoscopic hysterectomy 2005  . Cesarean section 85, 87, 95  . Lumbar disc surgery     x2  . Knee surgery     Realignment surgery  . Hand surgery   . Ulnar nerve repair   . Rotator cuff repair   . Nerve stimulator in lumbar spine   . Cholecystectomy     With complications  . Myomectomy   . Abdominal surgery      Family History  Problem Relation Age of Onset  . Early death Father 46    GUNSHOT  . Kidney disease Brother     s/p transplant  . Heart disease Maternal Grandmother     Heart defect  .  Prostate cancer Maternal Grandfather   . Cancer Maternal Grandfather     prostate     History   Social History  . Marital Status: Married    Spouse Name: N/A    Number of Children: 3  . Years of Education: N/A   Occupational History  . Retired    Social History Main Topics  . Smoking status: Current Everyday Smoker -- 1.0 packs/day for 20 years    Types: Cigarettes  . Smokeless tobacco: Never Used  . Alcohol Use: No     Rarely  . Drug Use: No  . Sexually Active: Not on file   Other Topics Concern  . Not on file   Social History Narrative   Lives with husband and 17YO daughter. 2 dogs. Disabled after left arm injury.     PHYSICAL EXAM   BP 170/98  Pulse 98  Ht 5\' 7"  (1.702 m)  Wt 212 lb 4 oz (96.276 kg)  BMI 33.24 kg/m2  Constitutional: She is oriented to person, place, and time. She appears well-developed and well-nourished. No distress.  HENT: No nasal  discharge.  Head: Normocephalic and atraumatic.  Eyes: Pupils are equal and round. Right eye exhibits no discharge. Left eye exhibits no discharge.  Neck: Normal range of motion. Neck supple. No JVD present. No thyromegaly present.  Cardiovascular: Normal rate, regular rhythm, normal heart sounds. Exam reveals no gallop and no friction rub. No murmur heard.  Pulmonary/Chest: Effort normal and breath sounds normal. No stridor. No respiratory distress. She has no wheezes. She has no rales. She exhibits no tenderness.  Abdominal: Soft. Bowel sounds are normal. She exhibits no distension. There is no tenderness. There is no rebound and no guarding.  Musculoskeletal: Normal range of motion. She exhibits no edema and no tenderness.  Neurological: She is alert and oriented to person, place, and time. Coordination normal.  Skin: Skin is warm and dry. No rash noted. She is not diaphoretic. No erythema. No pallor.  Psychiatric: She has a normal mood and affect. Her behavior is normal. Judgment and thought content normal.    EKG: Sinus  Rhythm  Poor R wave progression in the precordial leads  ABNORMAL    ASSESSMENT AND PLAN

## 2012-04-21 ENCOUNTER — Ambulatory Visit: Payer: Medicare Other | Admitting: Internal Medicine

## 2012-04-21 DIAGNOSIS — Z0289 Encounter for other administrative examinations: Secondary | ICD-10-CM

## 2012-04-22 ENCOUNTER — Other Ambulatory Visit (INDEPENDENT_AMBULATORY_CARE_PROVIDER_SITE_OTHER): Payer: Medicare Other

## 2012-04-22 ENCOUNTER — Encounter (INDEPENDENT_AMBULATORY_CARE_PROVIDER_SITE_OTHER): Payer: Medicare Other

## 2012-04-22 ENCOUNTER — Other Ambulatory Visit: Payer: Self-pay

## 2012-04-22 DIAGNOSIS — R011 Cardiac murmur, unspecified: Secondary | ICD-10-CM

## 2012-04-22 DIAGNOSIS — R0600 Dyspnea, unspecified: Secondary | ICD-10-CM

## 2012-04-22 DIAGNOSIS — R0989 Other specified symptoms and signs involving the circulatory and respiratory systems: Secondary | ICD-10-CM

## 2012-04-23 ENCOUNTER — Encounter (HOSPITAL_COMMUNITY): Payer: Self-pay | Admitting: Pharmacy Technician

## 2012-04-24 ENCOUNTER — Encounter (HOSPITAL_COMMUNITY)
Admission: RE | Admit: 2012-04-24 | Discharge: 2012-04-24 | Disposition: A | Discharge status: Home / Self Care | Payer: Medicare Other | Source: Ambulatory Visit | Attending: General Surgery | Admitting: General Surgery

## 2012-04-24 ENCOUNTER — Encounter (HOSPITAL_COMMUNITY): Payer: Self-pay

## 2012-04-24 ENCOUNTER — Ambulatory Visit (HOSPITAL_COMMUNITY)
Admission: RE | Admit: 2012-04-24 | Discharge: 2012-04-24 | Disposition: A | Discharge status: Home / Self Care | Payer: Medicare Other | Source: Ambulatory Visit | Attending: General Surgery | Admitting: General Surgery

## 2012-04-24 LAB — TYPE AND SCREEN
ABO/RH(D): O POS
Antibody Screen: NEGATIVE

## 2012-04-24 LAB — BASIC METABOLIC PANEL
GFR calc Af Amer: >90 mL/min (ref >90)
GFR calc non Af Amer: >90 mL/min (ref >90)
Glucose, Bld: 97 mg/dL (ref 70–99)
Potassium: 3.6 mEq/L (ref 3.5–5.1)
Sodium: 139 mEq/L (ref 135–145)

## 2012-04-24 LAB — ABO/RH: ABO/RH(D): O POS

## 2012-04-24 LAB — CBC
Hemoglobin: 15.7 g/dL — ABNORMAL HIGH (ref 12.0–15.0)
RBC: 5.43 MIL/uL — ABNORMAL HIGH (ref 3.87–5.11)
WBC: 9.1 10*3/uL (ref 4.0–10.5)

## 2012-04-24 LAB — SURGICAL PCR SCREEN
MRSA, PCR: NEGATIVE
Staphylococcus aureus: NEGATIVE

## 2012-04-24 MED ORDER — DEXTROSE 5 % IV SOLN
2.0000 g | INTRAVENOUS | Status: AC
  Administered 2012-04-25: 2 g via INTRAVENOUS
  Filled 2012-04-24: qty 2

## 2012-04-24 NOTE — Pre-Procedure Instructions (Signed)
[address]  04/24/2012   Your procedure is scheduled on:  Sept. 13, 2013  Report to [facility] at 5:30 AM.  Call this number if you have problems the morning of surgery: [phone]   Remember:   Do not eat food:After Midnight.    Take these medicines the morning of surgery with A SIP OF WATER: nexium, metoprolol, pain pill, phenergan as needed   Do not wear jewelry, make-up or nail polish.  Do not wear lotions, powders, or perfumes. You may wear deodorant.  Do not shave 48 hours prior to surgery. Men may shave face and neck.  Do not bring valuables to the hospital.  Contacts, dentures or bridgework may not be worn into surgery.  Leave suitcase in the car. After surgery it may be brought to your room.  For patients admitted to the hospital, checkout time is 11:00 AM the day of discharge.   Patients discharged the day of surgery will not be allowed to drive home.  Name and phone number of your driver:   Special Instructions: CHG Shower Use Special Wash: 1/2 bottle night before surgery and 1/2 bottle morning of surgery.   Please read over the following fact sheets that you were given: Pain Booklet, Coughing and Deep Breathing, Blood Transfusion Information and Surgical Site Infection Prevention

## 2012-04-25 ENCOUNTER — Encounter (HOSPITAL_COMMUNITY): Payer: Self-pay | Admitting: *Deleted

## 2012-04-25 ENCOUNTER — Encounter (HOSPITAL_COMMUNITY): Payer: Self-pay | Admitting: Anesthesiology

## 2012-04-25 ENCOUNTER — Inpatient Hospital Stay (HOSPITAL_COMMUNITY): Payer: Medicare Other | Admitting: Anesthesiology

## 2012-04-25 ENCOUNTER — Inpatient Hospital Stay (HOSPITAL_COMMUNITY)
Admission: RE | Admit: 2012-04-25 | Discharge: 2012-04-30 | DRG: 331 | Disposition: A | Discharge status: Home / Self Care | Payer: Medicare Other | Source: Ambulatory Visit | Attending: General Surgery | Admitting: General Surgery

## 2012-04-25 ENCOUNTER — Encounter (HOSPITAL_COMMUNITY)
Admission: RE | Disposition: A | Discharge status: Home / Self Care | Payer: Self-pay | Source: Ambulatory Visit | Attending: General Surgery

## 2012-04-25 DIAGNOSIS — E8881 Metabolic syndrome: Secondary | ICD-10-CM | POA: Diagnosis present

## 2012-04-25 DIAGNOSIS — I10 Essential (primary) hypertension: Secondary | ICD-10-CM | POA: Diagnosis present

## 2012-04-25 DIAGNOSIS — K219 Gastro-esophageal reflux disease without esophagitis: Secondary | ICD-10-CM | POA: Diagnosis present

## 2012-04-25 DIAGNOSIS — K589 Irritable bowel syndrome without diarrhea: Secondary | ICD-10-CM | POA: Diagnosis present

## 2012-04-25 DIAGNOSIS — F17200 Nicotine dependence, unspecified, uncomplicated: Secondary | ICD-10-CM | POA: Diagnosis present

## 2012-04-25 DIAGNOSIS — IMO0001 Reserved for inherently not codable concepts without codable children: Secondary | ICD-10-CM | POA: Diagnosis present

## 2012-04-25 DIAGNOSIS — Z6833 Body mass index (BMI) 33.0-33.9, adult: Secondary | ICD-10-CM

## 2012-04-25 DIAGNOSIS — Z888 Allergy status to other drugs, medicaments and biological substances status: Secondary | ICD-10-CM

## 2012-04-25 DIAGNOSIS — K565 Intestinal adhesions [bands], unspecified as to partial versus complete obstruction: Principal | ICD-10-CM | POA: Diagnosis present

## 2012-04-25 DIAGNOSIS — Z01812 Encounter for preprocedural laboratory examination: Secondary | ICD-10-CM

## 2012-04-25 DIAGNOSIS — E669 Obesity, unspecified: Secondary | ICD-10-CM | POA: Diagnosis present

## 2012-04-25 DIAGNOSIS — Z886 Allergy status to analgesic agent status: Secondary | ICD-10-CM

## 2012-04-25 DIAGNOSIS — Z79899 Other long term (current) drug therapy: Secondary | ICD-10-CM

## 2012-04-25 DIAGNOSIS — K432 Incisional hernia without obstruction or gangrene: Secondary | ICD-10-CM | POA: Diagnosis present

## 2012-04-25 DIAGNOSIS — K56609 Unspecified intestinal obstruction, unspecified as to partial versus complete obstruction: Secondary | ICD-10-CM

## 2012-04-25 HISTORY — PX: INCISIONAL HERNIA REPAIR: SHX193

## 2012-04-25 HISTORY — PX: BOWEL RESECTION: SHX1257

## 2012-04-25 LAB — GLUCOSE, CAPILLARY
Glucose-Capillary: 106 mg/dL — ABNORMAL HIGH (ref 70–99)
Glucose-Capillary: 168 mg/dL — ABNORMAL HIGH (ref 70–99)

## 2012-04-25 SURGERY — EXCISION, SMALL INTESTINE
Anesthesia: General | Site: Abdomen | Wound class: Contaminated

## 2012-04-25 MED ORDER — MORPHINE SULFATE (PF) 1 MG/ML IV SOLN
INTRAVENOUS | Status: AC
  Filled 2012-04-25: qty 25

## 2012-04-25 MED ORDER — PANTOPRAZOLE SODIUM 40 MG PO TBEC: 40.0000 mg | DELAYED_RELEASE_TABLET | Freq: Every day | ORAL | Status: DC

## 2012-04-25 MED ORDER — DEXTROSE 5 % IV SOLN
1.0000 g | Freq: Four times a day (QID) | INTRAVENOUS | Status: AC
  Administered 2012-04-25 – 2012-04-26 (×3): 1 g via INTRAVENOUS
  Filled 2012-04-25 (×3): qty 1

## 2012-04-25 MED ORDER — LABETALOL HCL 5 MG/ML IV SOLN
10.0000 mg | Freq: Once | INTRAVENOUS | Status: AC
  Administered 2012-04-25: 10 mg via INTRAVENOUS

## 2012-04-25 MED ORDER — DIPHENHYDRAMINE HCL 50 MG/ML IJ SOLN: 12.5000 mg | Freq: Four times a day (QID) | INTRAMUSCULAR | Status: DC | PRN

## 2012-04-25 MED ORDER — MORPHINE SULFATE (PF) 1 MG/ML IV SOLN
INTRAVENOUS | Status: DC
  Administered 2012-04-25 (×3): via INTRAVENOUS
  Administered 2012-04-26: 17 mg via INTRAVENOUS
  Filled 2012-04-25 (×2): qty 25

## 2012-04-25 MED ORDER — MIDAZOLAM HCL 5 MG/5ML IJ SOLN
INTRAMUSCULAR | Status: DC | PRN
  Administered 2012-04-25: 2 mg via INTRAVENOUS

## 2012-04-25 MED ORDER — PROMETHAZINE HCL 25 MG/ML IJ SOLN: 6.2500 mg | INTRAMUSCULAR | Status: DC | PRN

## 2012-04-25 MED ORDER — HEPARIN SODIUM (PORCINE) 5000 UNIT/ML IJ SOLN
5000.0000 [IU] | Freq: Three times a day (TID) | INTRAMUSCULAR | Status: DC
  Administered 2012-04-25 – 2012-04-30 (×15): 5000 [IU] via SUBCUTANEOUS
  Filled 2012-04-25 (×18): qty 1

## 2012-04-25 MED ORDER — SODIUM CHLORIDE 0.9 % IV SOLN
INTRAVENOUS | Status: DC
  Administered 2012-04-25 – 2012-04-26 (×2): via INTRAVENOUS

## 2012-04-25 MED ORDER — LABETALOL HCL 5 MG/ML IV SOLN
INTRAVENOUS | Status: AC
  Filled 2012-04-25: qty 4

## 2012-04-25 MED ORDER — NEOSTIGMINE METHYLSULFATE 1 MG/ML IJ SOLN
INTRAMUSCULAR | Status: DC | PRN
  Administered 2012-04-25: 5 mg via INTRAVENOUS

## 2012-04-25 MED ORDER — ONDANSETRON HCL 4 MG/2ML IJ SOLN
INTRAMUSCULAR | Status: DC | PRN
  Administered 2012-04-25: 4 mg via INTRAVENOUS

## 2012-04-25 MED ORDER — NALOXONE HCL 0.4 MG/ML IJ SOLN: 0.4000 mg | INTRAMUSCULAR | Status: DC | PRN

## 2012-04-25 MED ORDER — MORPHINE SULFATE 10 MG/ML IJ SOLN
INTRAMUSCULAR | Status: DC | PRN
  Administered 2012-04-25 (×4): 2.5 mg via INTRAVENOUS

## 2012-04-25 MED ORDER — FENTANYL CITRATE 0.05 MG/ML IJ SOLN
25.0000 ug | INTRAMUSCULAR | Status: DC | PRN
  Administered 2012-04-25 (×2): 50 ug via INTRAVENOUS

## 2012-04-25 MED ORDER — FENTANYL CITRATE 0.05 MG/ML IJ SOLN
INTRAMUSCULAR | Status: DC | PRN
  Administered 2012-04-25 (×4): 50 ug via INTRAVENOUS
  Administered 2012-04-25: 250 ug via INTRAVENOUS
  Administered 2012-04-25 (×6): 50 ug via INTRAVENOUS

## 2012-04-25 MED ORDER — ALBUMIN HUMAN 5 % IV SOLN
INTRAVENOUS | Status: DC | PRN
  Administered 2012-04-25: 09:00:00 via INTRAVENOUS

## 2012-04-25 MED ORDER — LABETALOL HCL 5 MG/ML IV SOLN
20.0000 mg | Freq: Once | INTRAVENOUS | Status: AC
  Administered 2012-04-25: 20 mg via INTRAVENOUS

## 2012-04-25 MED ORDER — ALBUTEROL SULFATE HFA 108 (90 BASE) MCG/ACT IN AERS
INHALATION_SPRAY | RESPIRATORY_TRACT | Status: DC | PRN
  Administered 2012-04-25: 2 via RESPIRATORY_TRACT

## 2012-04-25 MED ORDER — LACTATED RINGERS IV SOLN
INTRAVENOUS | Status: DC | PRN
  Administered 2012-04-25: 08:00:00 via INTRAVENOUS

## 2012-04-25 MED ORDER — ACETAMINOPHEN 10 MG/ML IV SOLN
1000.0000 mg | Freq: Four times a day (QID) | INTRAVENOUS | Status: AC
  Administered 2012-04-25 – 2012-04-26 (×4): 1000 mg via INTRAVENOUS
  Filled 2012-04-25 (×4): qty 100

## 2012-04-25 MED ORDER — ONDANSETRON HCL 4 MG/2ML IJ SOLN
4.0000 mg | Freq: Four times a day (QID) | INTRAMUSCULAR | Status: DC | PRN
  Administered 2012-04-26: 4 mg via INTRAVENOUS
  Filled 2012-04-25: qty 2

## 2012-04-25 MED ORDER — LISINOPRIL 20 MG PO TABS
20.0000 mg | ORAL_TABLET | Freq: Every day | ORAL | Status: DC
  Filled 2012-04-25: qty 1

## 2012-04-25 MED ORDER — ROCURONIUM BROMIDE 100 MG/10ML IV SOLN
INTRAVENOUS | Status: DC | PRN
  Administered 2012-04-25: 50 mg via INTRAVENOUS
  Administered 2012-04-25: 20 mg via INTRAVENOUS

## 2012-04-25 MED ORDER — PANTOPRAZOLE SODIUM 40 MG IV SOLR
40.0000 mg | Freq: Every day | INTRAVENOUS | Status: DC
  Administered 2012-04-25 – 2012-04-28 (×4): 40 mg via INTRAVENOUS
  Filled 2012-04-25 (×5): qty 40

## 2012-04-25 MED ORDER — PHENYLEPHRINE HCL 10 MG/ML IJ SOLN: INTRAMUSCULAR | Status: DC | PRN

## 2012-04-25 MED ORDER — FENTANYL CITRATE 0.05 MG/ML IJ SOLN
INTRAMUSCULAR | Status: AC
  Filled 2012-04-25: qty 2

## 2012-04-25 MED ORDER — METOPROLOL TARTRATE 1 MG/ML IV SOLN
5.0000 mg | Freq: Four times a day (QID) | INTRAVENOUS | Status: DC
  Administered 2012-04-25 – 2012-04-26 (×4): 5 mg via INTRAVENOUS
  Filled 2012-04-25 (×8): qty 5

## 2012-04-25 MED ORDER — MIDAZOLAM HCL 2 MG/2ML IJ SOLN: 1.0000 mg | INTRAMUSCULAR | Status: DC | PRN

## 2012-04-25 MED ORDER — PROPOFOL 10 MG/ML IV BOLUS
INTRAVENOUS | Status: DC | PRN
  Administered 2012-04-25: 200 mg via INTRAVENOUS

## 2012-04-25 MED ORDER — PHENYLEPHRINE HCL 10 MG/ML IJ SOLN
INTRAMUSCULAR | Status: DC | PRN
  Administered 2012-04-25: 40 ug via INTRAVENOUS

## 2012-04-25 MED ORDER — SUCCINYLCHOLINE CHLORIDE 20 MG/ML IJ SOLN
INTRAMUSCULAR | Status: DC | PRN
  Administered 2012-04-25: 120 mg via INTRAVENOUS

## 2012-04-25 MED ORDER — LIDOCAINE HCL (CARDIAC) 20 MG/ML IV SOLN
INTRAVENOUS | Status: DC | PRN
  Administered 2012-04-25: 80 mg via INTRAVENOUS

## 2012-04-25 MED ORDER — SODIUM CHLORIDE 0.9 % IJ SOLN: 9.0000 mL | INTRAMUSCULAR | Status: DC | PRN

## 2012-04-25 MED ORDER — LACTATED RINGERS IV SOLN
INTRAVENOUS | Status: DC | PRN
  Administered 2012-04-25 (×2): via INTRAVENOUS

## 2012-04-25 MED ORDER — DIPHENHYDRAMINE HCL 12.5 MG/5ML PO ELIX
12.5000 mg | ORAL_SOLUTION | Freq: Four times a day (QID) | ORAL | Status: DC | PRN
  Filled 2012-04-25: qty 5

## 2012-04-25 MED ORDER — 0.9 % SODIUM CHLORIDE (POUR BTL) OPTIME
TOPICAL | Status: DC | PRN
  Administered 2012-04-25: 2000 mL

## 2012-04-25 MED ORDER — GLYCOPYRROLATE 0.2 MG/ML IJ SOLN
INTRAMUSCULAR | Status: DC | PRN
  Administered 2012-04-25: .8 mg via INTRAVENOUS

## 2012-04-25 MED ORDER — METOPROLOL SUCCINATE ER 100 MG PO TB24
100.0000 mg | ORAL_TABLET | Freq: Every day | ORAL | Status: DC
  Filled 2012-04-25: qty 1

## 2012-04-25 MED ORDER — LABETALOL HCL 5 MG/ML IV SOLN
INTRAVENOUS | Status: DC | PRN
  Administered 2012-04-25 (×4): 5 mg via INTRAVENOUS

## 2012-04-25 SURGICAL SUPPLY — 44 items
BLADE SURG ROTATE 9660 (MISCELLANEOUS) IMPLANT
CELLS DAT CNTRL 66122 CELL SVR (MISCELLANEOUS) ×2 IMPLANT
CLOTH BEACON ORANGE TIMEOUT ST (SAFETY) ×3 IMPLANT
COVER SURGICAL LIGHT HANDLE (MISCELLANEOUS) ×3 IMPLANT
DRAPE LAPAROSCOPIC ABDOMINAL (DRAPES) ×3 IMPLANT
DRAPE UTILITY 15X26 W/TAPE STR (DRAPE) ×6 IMPLANT
DRAPE WARM FLUID 44X44 (DRAPE) ×3 IMPLANT
DRSG TEGADERM 4X4.75 (GAUZE/BANDAGES/DRESSINGS) ×3 IMPLANT
ELECT CAUTERY BLADE 6.4 (BLADE) ×3 IMPLANT
ELECT REM PT RETURN 9FT ADLT (ELECTROSURGICAL) ×3
ELECTRODE REM PT RTRN 9FT ADLT (ELECTROSURGICAL) ×2 IMPLANT
GLOVE BIO SURGEON STRL SZ 6.5 (GLOVE) IMPLANT
GLOVE BIOGEL PI IND STRL 7.0 (GLOVE) ×6 IMPLANT
GLOVE BIOGEL PI IND STRL 7.5 (GLOVE) ×4 IMPLANT
GLOVE BIOGEL PI INDICATOR 7.0 (GLOVE) ×3
GLOVE BIOGEL PI INDICATOR 7.5 (GLOVE) ×2
GLOVE SURG SS PI 6.0 STRL IVOR (GLOVE) ×3 IMPLANT
GLOVE SURG SS PI 6.5 STRL IVOR (GLOVE) ×15 IMPLANT
GLOVE SURG SS PI 7.5 STRL IVOR (GLOVE) ×9 IMPLANT
GOWN PREVENTION PLUS XXLARGE (GOWN DISPOSABLE) ×6 IMPLANT
GOWN STRL NON-REIN LRG LVL3 (GOWN DISPOSABLE) ×6 IMPLANT
KIT BASIN OR (CUSTOM PROCEDURE TRAY) ×3 IMPLANT
KIT ROOM TURNOVER OR (KITS) ×3 IMPLANT
NS IRRIG 1000ML POUR BTL (IV SOLUTION) ×6 IMPLANT
PACK GENERAL/GYN (CUSTOM PROCEDURE TRAY) ×3 IMPLANT
PAD ARMBOARD 7.5X6 YLW CONV (MISCELLANEOUS) ×3 IMPLANT
RELOAD PROXIMATE 75MM BLUE (ENDOMECHANICALS) ×3 IMPLANT
RTRCTR WOUND ALEXIS 18CM MED (MISCELLANEOUS) ×3
SEPRAFILM PROCEDURAL PACK 3X5 (MISCELLANEOUS) ×3 IMPLANT
SPONGE GAUZE 4X4 12PLY (GAUZE/BANDAGES/DRESSINGS) ×3 IMPLANT
SPONGE LAP 18X18 X RAY DECT (DISPOSABLE) ×3 IMPLANT
STAPLER GUN LINEAR PROX 60 (STAPLE) ×3 IMPLANT
STAPLER PROXIMATE 55 BLUE (STAPLE) ×3 IMPLANT
STAPLER VISISTAT 35W (STAPLE) ×3 IMPLANT
SUT PDS AB 0 CT 36 (SUTURE) ×15 IMPLANT
SUT SILK 2 0 TIES 10X30 (SUTURE) ×3 IMPLANT
SUT SILK 2 0SH CR/8 30 (SUTURE) ×6 IMPLANT
SUT SILK 3 0 TIES 10X30 (SUTURE) ×3 IMPLANT
SUT SILK 3 0SH CR/8 30 (SUTURE) ×3 IMPLANT
SUT VIC AB 2-0 CT1 18 (SUTURE) ×3 IMPLANT
TOWEL OR 17X24 6PK STRL BLUE (TOWEL DISPOSABLE) ×3 IMPLANT
TOWEL OR 17X26 10 PK STRL BLUE (TOWEL DISPOSABLE) ×3 IMPLANT
TRAY FOLEY BAG SILVER LF 14FR (CATHETERS) ×3 IMPLANT
WATER STERILE IRR 1000ML POUR (IV SOLUTION) IMPLANT

## 2012-04-25 NOTE — Progress Notes (Signed)
Dr, Michelle Piper notified about beads in hair that will not come out.

## 2012-04-25 NOTE — Anesthesia Preprocedure Evaluation (Addendum)
Anesthesia Evaluation  Patient identified by MRN, date of birth, ID band Patient awake    Reviewed: Allergy & Precautions, H&P , NPO status , Patient's Chart, lab work & pertinent test results  Airway Mallampati: I TM Distance: >3 FB Neck ROM: Full    Dental   Pulmonary COPDCurrent Smoker,  + rhonchi         Cardiovascular hypertension, Rhythm:Regular Rate:Normal     Neuro/Psych Depression  Neuromuscular disease    GI/Hepatic GERD-  ,  Endo/Other  Hyperthyroidism   Renal/GU      Musculoskeletal   Abdominal (+) + obese,  Abdomen: soft. Bowel sounds: normal.  Peds  Hematology   Anesthesia Other Findings   Reproductive/Obstetrics                         Anesthesia Physical Anesthesia Plan  ASA: III  Anesthesia Plan: General   Post-op Pain Management:    Induction: Intravenous  Airway Management Planned: Oral ETT  Additional Equipment:   Intra-op Plan:   Post-operative Plan: Extubation in OR  Informed Consent: I have reviewed the patients History and Physical, chart, labs and discussed the procedure including the risks, benefits and alternatives for the proposed anesthesia with the patient or authorized representative who has indicated his/her understanding and acceptance.     Plan Discussed with: CRNA and Surgeon  Anesthesia Plan Comments:         Anesthesia Quick Evaluation

## 2012-04-25 NOTE — Progress Notes (Signed)
UR complete 

## 2012-04-25 NOTE — Anesthesia Procedure Notes (Signed)
Procedure Name: Intubation Date/Time: 04/25/2012 7:36 AM Performed by: Ellin Goodie Pre-anesthesia Checklist: Patient identified, Emergency Drugs available, Suction available, Patient being monitored and Timeout performed Patient Re-evaluated:Patient Re-evaluated prior to inductionOxygen Delivery Method: Circle system utilized Preoxygenation: Pre-oxygenation with 100% oxygen Intubation Type: IV induction, Cricoid Pressure applied and Rapid sequence Ventilation: Mask ventilation without difficulty Laryngoscope Size: Mac and 3 Grade View: Grade III Tube type: Oral Tube size: 7.5 mm Number of attempts: 2 Airway Equipment and Method: Stylet and Video-laryngoscopy Placement Confirmation: ETT inserted through vocal cords under direct vision,  positive ETCO2 and breath sounds checked- equal and bilateral Secured at: 22 cm Tube secured with: Tape Dental Injury: Teeth and Oropharynx as per pre-operative assessment  Difficulty Due To: Difficulty was anticipated Future Recommendations: Recommend- induction with short-acting agent, and alternative techniques readily available Comments: RSI initiated due to recent nausea/vomiting reported by patient.  DL x 1 with MAC 3 blade with Grade 3 view.  Switched to glidescope.  DL x 1 with glidescope with good visualization.  ETT passed through vocal cords, verified by +ETCO2, + BBSE.  Carlynn Herald, CRNA

## 2012-04-25 NOTE — Op Note (Signed)
04/25/2012  10:12 AM  PATIENT:  Emma Munoz  46 y.o. female  Patient Care Team: Shelia Media, MD as PCP - General (Internal Medicine) Shayne Alken, MD (Physical Medicine and Rehabilitation) Beverley Fiedler, MD as Consulting Physician (Gastroenterology)  PRE-OPERATIVE DIAGNOSIS:  partial small bowell obstruction, incisional hernia  POST-OPERATIVE DIAGNOSIS:  partial small bowell obstruction, incisional hernia  PROCEDURE:  Procedure(s): SMALL BOWEL RESECTION HERNIA REPAIR INCISIONAL  SURGEON:  Surgeon(s): Romie Levee, MD Kandis Cocking, MD  ASSISTANT: Ezzard Standing   ANESTHESIA:   general  EBL:  Total I/O In: 3250 [I.V.:3000; IV Piggyback:250] Out: 200 [Urine:200]  Delay start of Pharmacological VTE agent (>24hrs) due to surgical blood loss or risk of bleeding:  no  DRAINS: none   SPECIMEN:  Source of Specimen:  small bowel anastomosis  DISPOSITION OF SPECIMEN:  PATHOLOGY  COUNTS:  YES  PLAN OF CARE: Admit to inpatient   PATIENT DISPOSITION:  PACU - hemodynamically stable.  INDICATION: This is a 46 year old female who presented to my clinic with nausea, vomiting and right upper quadrant abdominal pain. She had previously underwent a upper scopic cholecystectomy which was complicated by a small bowel injury. This was identified approximately 2 days later and repaired with a small bowel resection. She stated that ever since that time she has had difficulty with bloating and abdominal pain as well as nausea and vomiting. A CT scan and an upper GI showed some narrowing in the distal small bowel but no direct blockage. Upon physical exam she was noted to have a tender mass in the patient's right upper quadrant. Given her symptoms and these findings it was recommended that she undergo a small bowel resection of her previous anastomosis, as this was the most likely cause of her partial small bowel obstruction.  In the office we did note a incisional hernia at midline. I  discussed with her closure of this hernia primarily. We discussed the increased risk of recurrence with doing this. We also discussed the reasons for not placing mesh during this operation, given this would be a contaminated case. The risk and benefits and alternatives were explained to the patient prior to the OR and consent was signed and placed on chart.  OR FINDINGS: there appeared to be a stenosis at the anastomotic site. This was resected. There was also a portion of small bowel proximal to this which was densely adherent to the retroperitoneum. This or the anastomosis appeared to be the site of obstruction. The distal small bowel was decompressed.  DESCRIPTION: the patient was brought to the operating room and laid on the operating room table. General anesthesia was smoothly induced. A Foley catheter was inserted under sterile conditions. The patient's abdomen was then prepped and draped in the usual sterile fashion. A surgical timeout was performed indicating the correct patient, procedure, positioning and preoperative antibiotics. SCDs were also noted to be in place prior to the initiation of anesthesia.  We began by making a midline incision through her previous scar.  This was done using a 10 blade scalpel. Dissection was carefully carried down through the subcutaneous tissues until we encountered her hernia sac.  This was incised at midline and the adherent omentum and small bowel were taken down from the peritoneal attachments using a combination of electrocautery and sharp dissection. Once the omentum was freed it was placed into the left upper quadrant and we began lysing adhesions of her small bowel. Most of her adhesions were centered in the right upper  quadrant.  We encountered her anastomosis. Upon palpation I was unable to identify a direct lumen between the tips of my fingers. There was a portion of small bowel proximal to this which was encased in dense adhesions. This was lysed using  Metzenbaum scissors. We continued to run her small bowel down to the level of the terminal ileum. There was one portion of small bowel that was densely adherent to her midline incision. This was carefully taken down off the peritoneum using Metzenbaum scissors. Once all adhesions were freed the entire small bowel was ran and no injury sites were noted. At that time I decided to resect her anastomosis given that a clear lumen could not be identified. The 2 portions of small bowel distal to this were brought together using 2 interrupted 3-0 silk sutures. A small enterotomy was made in both lumens of small bowel using Bovie electrocautery. Prior to this the wound was protected using a Alexis wound protector as well as multiple laparotomy sponges. A GIA blue load stapler was then inserted into both enterotomy channels and fired to create our anastomosis. The common enterotomy channel was closed using a TA blue load stapler. The mesenteric portion of our resected specimen was taken down using interrupted 2-0 suture ligatures. After this was completed and hemostasis was assured, I evaluated the staple line. There was a portion of the corner of the staple line between the 2 pieces of bowel which had a small bleeding vessel. This was oversewed using a 3-0 silk suture. I used  another 3-0 silk suture to control bleeding at a second portion of the staple line.  Once hemostasis was achieved, the mesenteric defect was closed using interrupted 3-0 silk suture.  After this the anastomosis was placed back into the abdomen as well as the rest of her small bowel. The abdomen was irrigated with warm normal saline. The wound retractor was removed. We changed our gloves after our anastomosis. We then turned our attention to the fixing her hernia. The fascia was identified at both edges and a Kocher clamp was placed on the fascia.the hernia sac was divided from the fascia and discarded. The fascia was then closed using interrupted 0 PDS  sutures. After this was completed the subcutaneous tissue was closed using interrupted 2-0 Vicryl sutures. The umbilicus was reapproximated to the midline at using a 2-0 Vicryl suture as well. The skin was then closed with staples. A sterile 4 x 4 and Tegaderm was then placed over the incision site. The patient was then awakened from anesthesia and sent to the postanesthesia care units stable condition. All counts were correct operating room staff. I was present and directly performed the entire procedure.

## 2012-04-25 NOTE — H&P (View-Only) (Signed)
Chief Complaint  Patient presents with  . Pre-op Exam    eval SBO    HISTORY: Emma Munoz is a 46 y.o. female who presents to the office s/p a cholecystectomy complicated by a contained small bowel perforation.  This was identified and repaired on POD 3.  Since then, she has had intermittent nausea, vomiting and abd pain. Of note, she also has hyperparathyroidism and is currently being followed for this.  She states that the episodes are getting closer together and she can barely tolerate liquids.  She gets abdominal pain and nausea shortly after she eats.  She has lost 15-30lbs.  She has been seen by GI and it was felt that a resection would be in her best interest.  Past Medical History  Diagnosis Date  . Diabetes mellitus   . Hypertension   . Tobacco abuse   . IBS (irritable bowel syndrome)   . Constipation     Past Surgical History  Procedure Date  . Laparoscopic hysterectomy 2005  . Cesarean section 85, 87, 95  . Lumbar disc surgery     x2  . Knee surgery     Realignment surgery  . Hand surgery   . Ulnar nerve repair   . Rotator cuff repair   . Nerve stimulator in lumbar spine   . Cholecystectomy     With complications  . Myomectomy   . Abdominal surgery     Current Outpatient Prescriptions  Medication Sig Dispense Refill  . cyclobenzaprine (FLEXERIL) 10 MG tablet Take 20 mg by mouth at bedtime.      Marland Kitchen esomeprazole (NEXIUM) 40 MG capsule Take 40 mg by mouth daily.      . hyoscyamine (LEVSIN SL) 0.125 MG SL tablet Place 1 tablet (0.125 mg total) under the tongue every 4 (four) hours as needed for cramping.  30 tablet  0  . Linaclotide (LINZESS) 290 MCG CAPS Take 1 capsule by mouth daily.  30 capsule  3  . lisinopril (PRINIVIL,ZESTRIL) 20 MG tablet Take 20 mg by mouth daily.      . metoprolol succinate (TOPROL-XL) 100 MG 24 hr tablet Take 100 mg by mouth daily. Take with or immediately following a meal.      . morphine (KADIAN) 100 MG 24 hr capsule Take 100 mg by  mouth daily. pain      . morphine (MSIR) 30 MG tablet Take 30 mg by mouth every 6 (six) hours as needed. For pain      . promethazine (PHENERGAN) 25 MG suppository Place 25 mg rectally every 6 (six) hours as needed.      . Vitamin D, Ergocalciferol, (DRISDOL) 50000 UNITS CAPS Take 50,000 Units by mouth 2 (two) times a week. Monday & Thursdays      . DISCONTD: promethazine (PHENERGAN) 25 MG suppository Place 1 suppository (25 mg total) rectally every 6 (six) hours as needed for nausea.  12 each  1     Allergies  Allergen Reactions  . Gadolinium Derivatives     Pt states nausea and vomiting to GAD in MRI pt states she does ok with iv/cm in cat scan  . Aspirin     REACTION: Tongue, throat, extremities swell  . Dilaudid (Hydromorphone Hcl) Nausea And Vomiting  . Ibuprofen     REACTION: Tongue, throat, extremities swell  . Latex Hives  . Nsaids     REACTION: Tongue, throat, extremities swell     Family History  Problem Relation Age of Onset  .  Early death Father 56    GUNSHOT  . Kidney disease Brother     s/p transplant  . Heart disease Maternal Grandmother     Heart defect  . Prostate cancer Maternal Grandfather   . Cancer Maternal Grandfather     prostate     History   Social History  . Marital Status: Married    Spouse Name: N/A    Number of Children: 3  . Years of Education: N/A   Occupational History  . Retired    Social History Main Topics  . Smoking status: Current Everyday Smoker -- 1.0 packs/day for 20 years    Types: Cigarettes  . Smokeless tobacco: Never Used  . Alcohol Use: Yes     Rarely  . Drug Use: No  . Sexually Active: None   Other Topics Concern  . None   Social History Narrative   Lives with husband and 17YO daughter. 2 dogs. Disabled after left arm injury.   REVIEW OF SYSTEMS - PERTINENT POSITIVES ONLY: Review of Systems - General ROS: positive for  - malaise and weight loss negative for - fever or night sweats Psychological ROS:  negative for - hallucinations, irritability or memory difficulties Hematological and Lymphatic ROS: negative for - bleeding problems, bruising, jaundice, night sweats or swollen lymph nodes Endocrine ROS: negative for - malaise/lethargy, palpitations or temperature intolerance Respiratory ROS: no cough, shortness of breath, or wheezing Cardiovascular ROS: no chest pain or dyspnea on exertion Gastrointestinal ROS: positive for - abdominal pain, appetite loss and nausea and vomting Negative for - blood in stools, constipation or diarrhea Genito-Urinary ROS: no dysuria, trouble voiding, or hematuria Neurological ROS: no TIA or stroke symptoms positive for - weakness of R hand   EXAM: Filed Vitals:   04/16/12 1342  BP: 158/100  Pulse: 78  Temp: 97.8 F (36.6 C)    Gen:  No acute distress.  Well nourished and well groomed.   Neurological: Alert and oriented to person, place, and time. Coordination normal.  Head: Normocephalic and atraumatic.  Eyes: Conjunctivae are normal. Pupils are equal, round, and reactive to light. No scleral icterus.  Neck: Normal range of motion. Neck supple. No tracheal deviation or thyromegaly present.  No cervical lymphadenopathy. Cardiovascular: Normal rate, regular rhythm, normal heart sounds and intact distal pulses.  Exam reveals no gallop and no friction rub.  No murmur heard. Respiratory: Effort normal.  No respiratory distress. No chest wall tenderness. Breath sounds normal.  No wheezes, rales or rhonchi.  GI: Soft. Slightly distended.  Tender mass in RUQ.  Incisional hernia. Musculoskeletal: Normal range of motion. Extremities are nontender.  Skin: Skin is warm and dry. No rash noted. No diaphoresis. No erythema. No pallor. No clubbing, cyanosis, or edema.   Psychiatric: Normal mood and affect. Behavior is normal. Judgment and thought content normal.    LABORATORY RESULTS: Available labs are reviewed    RADIOLOGY RESULTS:   Images and reports are  reviewed. CT abd shows some narrowing at the terminal ileum.  The oral contrast stops at her anastomosis. UGI with SBFT IMPRESSION:  1. Small to moderate gastroesophageal reflux to the level of the mid-esophagus. No esophageal stricture.  2. Unremarkable stomach and duodenal bulb.  3. No definite evidence of small bowel obstruction. There is a short segment of narrowing of the lumen of the terminal small bowel suspicious for a stricture. Normal transit time.      ASSESSMENT AND PLAN: Emma Munoz is a 46 y.o. female who  presents to me with abdominal pain, nausea and vomiting.  I am concerned that she has a blackage/stricture at her anastomosis.  She could also have an intussusception which could cause her symptoms and essentially negative imaging.  Given the finding of a tender RUQ mass and these symptoms, I recommended that she undergo an ex lap and small bowel resection.  I explained to her the risk and benefits of the surgery.  I told her that I didn't think there were any more tests or procedures that would give [place] a better picture.  I am concerned about her hypertension, but she tells me that she is off her medications.  I will send her to her PCP, Dr Dan Humphreys, for medical clearance and BP control.  I have also ordered some labwork to evaluate her chemistries and her albumin levels.  We will schedule her for inpatient surgery ASAP.  I told her I would repair her hernia primarily during her surgery and we discussed the risks and benefits of various repairs.  She understands that there is a increased recurrence rate with this repair.    Vanita Panda, MD Colon and Rectal Surgery / General Surgery [facility]      Visit Diagnoses: 1. Partial small bowel obstruction   2. Pre-op evaluation     Primary Care Physician: Shelia Media, MD

## 2012-04-25 NOTE — Interval H&P Note (Signed)
History and Physical Interval Note:  04/25/2012 7:21 AM  Emma Munoz  has presented today for surgery, with the diagnosis of partial small bowell obstruction, incisional hernia  The various methods of treatment have been discussed with the patient and family. After consideration of risks, benefits and other options for treatment, the patient has consented to  Procedure(s) (LRB) with comments: SMALL BOWEL RESECTION (N/A) - small bowell resection, incisional hernia repair HERNIA REPAIR INCISIONAL (N/A) as a surgical intervention .  The patient's history has been reviewed, patient examined, no change in status, stable for surgery.  I have reviewed the patient's chart and labs.  Questions were answered to the patient's satisfaction.     Vanita Panda, MD  Colorectal and General Surgery [facility]

## 2012-04-25 NOTE — Anesthesia Postprocedure Evaluation (Signed)
  Anesthesia Post-op Note  Patient: Emma Munoz  Procedure(s) Performed: Procedure(s) (LRB) with comments: SMALL BOWEL RESECTION (N/A) - small bowell resection, incisional hernia repair HERNIA REPAIR INCISIONAL (N/A)  Patient Location: PACU  Anesthesia Type: General  Level of Consciousness: awake  Airway and Oxygen Therapy: Patient Spontanous Breathing  Post-op Pain: mild  Post-op Assessment: Post-op Vital signs reviewed, Patient's Cardiovascular Status Stable, Respiratory Function Stable, Patent Airway, No signs of Nausea or vomiting and Pain level controlled  Post-op Vital Signs: stable  Complications: No apparent anesthesia complications

## 2012-04-25 NOTE — Transfer of Care (Signed)
Immediate Anesthesia Transfer of Care Note  Patient: Emma Munoz  Procedure(s) Performed: Procedure(s) (LRB) with comments: SMALL BOWEL RESECTION (N/A) - small bowell resection, incisional hernia repair HERNIA REPAIR INCISIONAL (N/A)  Patient Location: PACU  Anesthesia Type: General  Level of Consciousness: awake, alert  and oriented  Airway & Oxygen Therapy: Patient Spontanous Breathing  Post-op Assessment: Report given to PACU RN  Post vital signs: stable  Complications: No apparent anesthesia complications

## 2012-04-26 LAB — CBC
MCH: 28.1 pg (ref 26.0–34.0)
MCV: 80.5 fL (ref 78.0–100.0)
Platelets: 333 10*3/uL (ref 150–400)
RDW: 13.2 % (ref 11.5–15.5)

## 2012-04-26 LAB — BASIC METABOLIC PANEL
Calcium: 10.6 mg/dL — ABNORMAL HIGH (ref 8.4–10.5)
Creatinine, Ser: 0.61 mg/dL (ref 0.50–1.10)
GFR calc Af Amer: >90 mL/min (ref >90)
GFR calc non Af Amer: >90 mL/min (ref >90)
Sodium: 135 mEq/L (ref 135–145)

## 2012-04-26 MED ORDER — SODIUM CHLORIDE 0.9 % IJ SOLN: 9.0000 mL | INTRAMUSCULAR | Status: DC | PRN

## 2012-04-26 MED ORDER — FENTANYL 10 MCG/ML IV SOLN
INTRAVENOUS | Status: DC
  Administered 2012-04-26: 60 ug via INTRAVENOUS
  Administered 2012-04-26: 7 ug via INTRAVENOUS
  Administered 2012-04-26: 120 ug via INTRAVENOUS
  Administered 2012-04-26: 248.1 ug/h via INTRAVENOUS
  Administered 2012-04-26: 01:00:00 via INTRAVENOUS
  Administered 2012-04-27: 60 ug via INTRAVENOUS
  Administered 2012-04-27: 30 ug/h via INTRAVENOUS
  Administered 2012-04-27: 98 ug via INTRAVENOUS
  Administered 2012-04-27: 180 ug/h via INTRAVENOUS
  Administered 2012-04-27: 23:00:00 via INTRAVENOUS
  Administered 2012-04-27: 30 ug/h via INTRAVENOUS
  Administered 2012-04-28: 30 ug via INTRAVENOUS
  Administered 2012-04-28: 15 ug via INTRAVENOUS
  Administered 2012-04-28: 45 ug/h via INTRAVENOUS
  Administered 2012-04-28: 30 ug via INTRAVENOUS
  Filled 2012-04-26 (×3): qty 50

## 2012-04-26 MED ORDER — BIOTENE DRY MOUTH MT LIQD
15.0000 mL | Freq: Two times a day (BID) | OROMUCOSAL | Status: DC
  Administered 2012-04-26 – 2012-04-29 (×6): 15 mL via OROMUCOSAL

## 2012-04-26 MED ORDER — HYDRALAZINE HCL 20 MG/ML IJ SOLN
5.0000 mg | INTRAMUSCULAR | Status: DC | PRN
  Administered 2012-04-26 (×2): 5 mg via INTRAVENOUS
  Filled 2012-04-26 (×3): qty 0.25

## 2012-04-26 MED ORDER — HYDRALAZINE HCL 20 MG/ML IJ SOLN
10.0000 mg | INTRAMUSCULAR | Status: DC | PRN
  Administered 2012-04-27: 10 mg via INTRAVENOUS
  Filled 2012-04-26 (×3): qty 0.5

## 2012-04-26 MED ORDER — DIPHENHYDRAMINE HCL 50 MG/ML IJ SOLN: 12.5000 mg | Freq: Four times a day (QID) | INTRAMUSCULAR | Status: DC | PRN

## 2012-04-26 MED ORDER — NALOXONE HCL 0.4 MG/ML IJ SOLN: 0.4000 mg | INTRAMUSCULAR | Status: DC | PRN

## 2012-04-26 MED ORDER — DIPHENHYDRAMINE HCL 12.5 MG/5ML PO ELIX
12.5000 mg | ORAL_SOLUTION | Freq: Four times a day (QID) | ORAL | Status: DC | PRN
  Filled 2012-04-26: qty 5

## 2012-04-26 MED ORDER — ONDANSETRON HCL 4 MG/2ML IJ SOLN: 4.0000 mg | Freq: Four times a day (QID) | INTRAMUSCULAR | Status: DC | PRN

## 2012-04-26 MED ORDER — ENALAPRILAT 1.25 MG/ML IV SOLN
1.2500 mg | INTRAVENOUS | Status: DC | PRN
  Administered 2012-04-26 – 2012-04-28 (×6): 1.25 mg via INTRAVENOUS
  Filled 2012-04-26 (×5): qty 1

## 2012-04-26 MED ORDER — FENTANYL 25 MCG/HR TD PT72
25.0000 ug | MEDICATED_PATCH | TRANSDERMAL | Status: DC
  Administered 2012-04-26 – 2012-04-29 (×2): 25 ug via TRANSDERMAL
  Filled 2012-04-26 (×2): qty 1

## 2012-04-26 MED ORDER — KCL IN DEXTROSE-NACL 20-5-0.45 MEQ/L-%-% IV SOLN
INTRAVENOUS | Status: DC
  Administered 2012-04-26 – 2012-04-28 (×6): via INTRAVENOUS
  Filled 2012-04-26 (×9): qty 1000

## 2012-04-26 MED ORDER — LABETALOL HCL 5 MG/ML IV SOLN
10.0000 mg | INTRAVENOUS | Status: DC | PRN
  Administered 2012-04-26 (×2): 10 mg via INTRAVENOUS
  Filled 2012-04-26: qty 4

## 2012-04-26 MED ORDER — METOPROLOL TARTRATE 1 MG/ML IV SOLN
10.0000 mg | Freq: Four times a day (QID) | INTRAVENOUS | Status: DC
  Administered 2012-04-26 – 2012-04-27 (×6): 10 mg via INTRAVENOUS
  Filled 2012-04-26 (×8): qty 10

## 2012-04-26 NOTE — Progress Notes (Signed)
1 Day Post-Op   Subjective: Pt with pain and htn issues throughout the night.  Switched to Fentanyl PCA  Objective: Vital signs in last 24 hours: Temp:  [97.2 F (36.2 C)-97.8 F (36.6 C)] 97.8 F (36.6 C) (09/13 2111) Pulse Rate:  [61-128] 85  (09/14 0621) Resp:  [15-29] 23  (09/14 0629) BP: (142-198)/(94-125) 186/105 mmHg (09/14 0656) SpO2:  [92 %-100 %] 97 % (09/14 0603) FiO2 (%):  [37 %] 37 % (09/13 2016) Weight:  [214 lb 1.1 oz (97.1 kg)] 214 lb 1.1 oz (97.1 kg) (09/13 1252)   Intake/Output from previous day: 09/13 0701 - 09/14 0700 In: 3574.5 [I.V.:3324.5; IV Piggyback:250] Out: 3600 [Urine:3600] Intake/Output this shift:     General appearance: alert and no distress GI: soft, distended, appropriately tender  Incision: dressing clean, dry and intact  Lab Results:   Basename 04/26/12 0550 04/24/12 1409  WBC 14.9* 9.1  HGB 13.5 15.7*  HCT 38.7 44.6  PLT 333 320   BMET  Basename 04/26/12 0550 04/24/12 1409  NA 135 139  K 3.6 3.6  CL 98 102  CO2 27 28  GLUCOSE 121* 97  BUN 8 10  CREATININE 0.61 0.64  CALCIUM 10.6* 11.1*   MEDS, Scheduled    . acetaminophen  1,000 mg Intravenous Q6H  . cefOXitin  1 g Intravenous Q6H  . fentaNYL      . fentaNYL   Intravenous Q4H  . heparin  5,000 Units Subcutaneous Q8H  . labetalol      . labetalol      . labetalol  10 mg Intravenous Once  . labetalol  20 mg Intravenous Once  . metoprolol  10 mg Intravenous Q6H  . morphine      . pantoprazole (PROTONIX) IV  40 mg Intravenous QHS  . DISCONTD: lisinopril  20 mg Oral Daily  . DISCONTD: metoprolol  5 mg Intravenous Q6H  . DISCONTD: metoprolol succinate  100 mg Oral Daily  . DISCONTD: morphine   Intravenous Q4H  . DISCONTD: pantoprazole  40 mg Oral Daily    Studies/Results: Dg Chest 2 View  04/24/2012  *RADIOLOGY REPORT*  Clinical Data: Preop.  Pre admit for abdominal surgery.  CHEST - 2 VIEW  Comparison: 09/11/2004  Findings: Heart size is normal.  Mildly  prominent lung markings are identified at the bases.  The patient has a spinal stimulator.  IMPRESSION: Stable appearance of the chest. No evidence for acute  abnormality.   Original Report Authenticated By: Patterson Hammersmith, M.D.     Assessment: s/p Procedure(s): SMALL BOWEL RESECTION HERNIA REPAIR INCISIONAL Patient Active Problem List  Diagnosis  . HYPERLIPIDEMIA  . METABOLIC SYNDROME X  . OBESITY  . ANEMIA-NOS  . TOBACCO ABUSE  . DEPRESSION  . REFLEX SYMPATHETIC DYSTROPHY  . HYPERTENSION  . ALLERGIC RHINITIS  . GERD  . GASTROPARESIS  . HERNIATED CERVICAL DISC  . INSOMNIA  . FATIGUE  . PARESTHESIA  . MIGRAINES, HX OF  . Diabetes mellitus type 2, uncontrolled  . Nausea  . Primary hyperparathyroidism  . Cholelithiasis  . Hypertension  . Preop cardiovascular exam  . Abdominal pain  . Open abdominal wall wound  . Fatigue  . Abdominal distension  . SBO (small bowel obstruction)  . Hypoparathyroidism  . Anastomotic stricture of small intestine  . Constipation  . IBS (irritable bowel syndrome)  . Partial small bowel obstruction      Plan: d/c foley maintenance IV Cont Fent PCA No toradol due to severe NSAID  allergy Ambulate Hep SQ and SCD's, PPI HTN: this was a problem at baseline.  I have increased her B Blocker and add prn hydralazine May need medicine consult if continues to be a problem If vomits, please place NG   LOS: 1 day     .Vanita Panda, MD [facility], [place] [phone]   04/26/2012 8:49 AM

## 2012-04-26 NOTE — Progress Notes (Signed)
Spoke with Dr. Gaynelle Adu about pt condition:  Temp 101.1, BP continues to remain elevated in spite of prn meds for BP, pt states pain in not controlled on the full dose fentanyl pca pump.  Orders received.

## 2012-04-26 NOTE — Progress Notes (Addendum)
Pt b/p systole band diastole has been apprx 183+/- abd 110 +/-. Notified Dr. Janee Morn at 0000, after noting the b/p level was not decrease after HTN med administered, non effective pain med. Dr. Janee Morn at that D/C morphine, ordered Fentanyl, MD felt the pain was effecting the bp, Bp was noted every to 1hour to note any change. NO change was noted, notified MD at 0238 of the BP again, at that MD, ordered Labetalol (see Mar). Bp was taken manual note that wast little change. Notified MD at apprx 0458, to give update on BP (no change). MD decided to not to place any new order and they would see the patient pat first thing in the morning. Administered pt scheduled meds. Foley D/C at [phone]

## 2012-04-27 LAB — CBC
Hemoglobin: 13.9 g/dL (ref 12.0–15.0)
MCHC: 35.9 g/dL (ref 30.0–36.0)
RBC: 4.8 MIL/uL (ref 3.87–5.11)

## 2012-04-27 LAB — BASIC METABOLIC PANEL
GFR calc non Af Amer: >90 mL/min (ref >90)
Glucose, Bld: 112 mg/dL — ABNORMAL HIGH (ref 70–99)
Potassium: 3.9 mEq/L (ref 3.5–5.1)
Sodium: 136 mEq/L (ref 135–145)

## 2012-04-27 MED ORDER — METOPROLOL TARTRATE 1 MG/ML IV SOLN
20.0000 mg | Freq: Four times a day (QID) | INTRAVENOUS | Status: DC
  Administered 2012-04-28 – 2012-04-29 (×5): 20 mg via INTRAVENOUS
  Filled 2012-04-27 (×10): qty 20

## 2012-04-27 NOTE — Progress Notes (Signed)
2 Days Post-Op  Subjective: Pt with some elev BP overnight and c/o pain as well.  Pt had her BP meds increased and added vasotec.  Pain was addressed with Fent patch, and pt states that has helped control her pain better. No flatus/BM.  Objective: Vital signs in last 24 hours: Temp:  [98.7 F (37.1 C)-101.1 F (38.4 C)] 99.1 F (37.3 C) (09/15 0452) Pulse Rate:  [86-111] 98  (09/15 0622) Resp:  [21-33] 26  (09/15 0455) BP: (148-217)/(90-116) 153/97 mmHg (09/15 0622) SpO2:  [93 %-99 %] 99 % (09/15 0455) Last BM Date: 04/23/12  Intake/Output from previous day: 09/14 0701 - 09/15 0700 In: 1720.4 [I.V.:1720.4] Out: 1500 [Urine:1500] Intake/Output this shift:    General appearance: alert and cooperative Resp: clear to auscultation bilaterally Cardio: regular rate and rhythm, S1, S2 normal, no murmur, click, rub or gallop GI: s/nd/ approp ttp/ decreased BS, wound c/d/i  Lab Results:   Basename 04/26/12 0550 04/24/12 1409  WBC 14.9* 9.1  HGB 13.5 15.7*  HCT 38.7 44.6  PLT 333 320   BMET  Basename 04/26/12 0550 04/24/12 1409  NA 135 139  K 3.6 3.6  CL 98 102  CO2 27 28  GLUCOSE 121* 97  BUN 8 10  CREATININE 0.61 0.64  CALCIUM 10.6* 11.1*   PT/INR No results found for this basename: LABPROT:2,INR:2 in the last 72 hours ABG No results found for this basename: PHART:2,PCO2:2,PO2:2,HCO3:2 in the last 72 hours  Studies/Results: No results found.  Anti-infectives: Anti-infectives     Start     Dose/Rate Route Frequency Ordered Stop   04/25/12 1430   cefOXitin (MEFOXIN) 1 g in dextrose 5 % 50 mL IVPB        1 g 100 mL/hr over 30 Minutes Intravenous Every 6 hours 04/25/12 1252 04/26/12 0341   04/24/12 1433   cefOXitin (MEFOXIN) 2 g in dextrose 5 % 50 mL IVPB        2 g 100 mL/hr over 30 Minutes Intravenous 60 min pre-op 04/24/12 1433 04/25/12 0736          Assessment/Plan: s/p Procedure(s) (LRB) with comments: SMALL BOWEL RESECTION (N/A) - small bowell  resection, incisional hernia repair HERNIA REPAIR INCISIONAL (N/A)  Cont Fent PCA/ patch No toradol due to severe NSAID allergy Ambulate Hep SQ and SCD's, PPI HTN: this was a problem at baseline. Seems under better control overnight after meds adjusted. May need medicine consult if continues to be a problem If vomits, please place NG   LOS: 2 days    Marigene Ehlers., [facility] 04/27/2012

## 2012-04-28 ENCOUNTER — Ambulatory Visit (INDEPENDENT_AMBULATORY_CARE_PROVIDER_SITE_OTHER): Payer: Medicare Other | Admitting: General Surgery

## 2012-04-28 ENCOUNTER — Encounter (HOSPITAL_COMMUNITY): Payer: Self-pay | Admitting: General Surgery

## 2012-04-28 LAB — CBC
Hemoglobin: 12.8 g/dL (ref 12.0–15.0)
MCH: 28.6 pg (ref 26.0–34.0)
RBC: 4.47 MIL/uL (ref 3.87–5.11)
WBC: 14.6 10*3/uL — ABNORMAL HIGH (ref 4.0–10.5)

## 2012-04-28 LAB — BASIC METABOLIC PANEL
CO2: 23 mEq/L (ref 19–32)
Chloride: 103 mEq/L (ref 96–112)
Glucose, Bld: 126 mg/dL — ABNORMAL HIGH (ref 70–99)
Potassium: 3.7 mEq/L (ref 3.5–5.1)
Sodium: 135 mEq/L (ref 135–145)

## 2012-04-28 MED ORDER — MORPHINE SULFATE 15 MG PO TABS: 30.0000 mg | ORAL_TABLET | ORAL | Status: DC | PRN

## 2012-04-28 MED ORDER — MORPHINE SULFATE ER 100 MG PO TBCR
100.0000 mg | EXTENDED_RELEASE_TABLET | Freq: Two times a day (BID) | ORAL | Status: DC
  Administered 2012-04-28 – 2012-04-30 (×5): 100 mg via ORAL
  Filled 2012-04-28 (×5): qty 1

## 2012-04-28 NOTE — Progress Notes (Signed)
3 Days Post-Op   Subjective: Pain controlled, BM this am  Objective: Vital signs in last 24 hours: Temp:  [97.9 F (36.6 C)-99.9 F (37.7 C)] 97.9 F (36.6 C) (09/16 0959) Pulse Rate:  [76-108] 78  (09/16 1255) Resp:  [13-33] 20  (09/16 1159) BP: (139-181)/(88-113) 149/93 mmHg (09/16 1255) SpO2:  [92 %-100 %] 100 % (09/16 1255)   Intake/Output from previous day: 09/15 0701 - 09/16 0700 In: 2408.3 [I.V.:2408.3] Out: 450 [Urine:450] Intake/Output this shift: Total I/O In: 0  Out: 100 [Urine:100]   General appearance: alert and no distress GI: soft, less distended, less tender  Incision: clean, dry and intact  Lab Results:   Basename 04/28/12 0530 04/27/12 1014  WBC 14.6* 18.0*  HGB 12.8 13.9  HCT 36.3 38.7  PLT 328 311   BMET  Basename 04/28/12 0530 04/27/12 1014  NA 135 136  K 3.7 3.9  CL 103 102  CO2 23 23  GLUCOSE 126* 112*  BUN 9 7  CREATININE 0.48* 0.46*  CALCIUM 11.7* 12.0*   MEDS, Scheduled    . antiseptic oral rinse  15 mL Mouth Rinse BID  . fentaNYL  25 mcg Transdermal Q72H  . fentaNYL   Intravenous Q4H  . heparin  5,000 Units Subcutaneous Q8H  . metoprolol  20 mg Intravenous Q6H  . pantoprazole (PROTONIX) IV  40 mg Intravenous QHS  . DISCONTD: metoprolol  10 mg Intravenous Q6H    Studies/Results: No results found.  Assessment: s/p Procedure(s): SMALL BOWEL RESECTION HERNIA REPAIR INCISIONAL Patient Active Problem List  Diagnosis  . HYPERLIPIDEMIA  . METABOLIC SYNDROME X  . OBESITY  . ANEMIA-NOS  . TOBACCO ABUSE  . DEPRESSION  . REFLEX SYMPATHETIC DYSTROPHY  . HYPERTENSION  . ALLERGIC RHINITIS  . GERD  . GASTROPARESIS  . HERNIATED CERVICAL DISC  . INSOMNIA  . FATIGUE  . PARESTHESIA  . MIGRAINES, HX OF  . Diabetes mellitus type 2, uncontrolled  . Nausea  . Primary hyperparathyroidism  . Cholelithiasis  . Hypertension  . Preop cardiovascular exam  . Abdominal pain  . Open abdominal wall wound  . Fatigue  . Abdominal  distension  . SBO (small bowel obstruction)  . Hypoparathyroidism  . Anastomotic stricture of small intestine  . Constipation  . IBS (irritable bowel syndrome)  . Partial small bowel obstruction      Plan: Decrease MIV, clears- advance as tolerated Cont Fent PCA, will start home meds today and taper off pca tomorrow Ambulate Hep SQ and SCD's, PPI HTN: this was a problem at baseline.  I have increased her B Blocker and she has prn hydralazine   LOS: 3 days     .Vanita Panda, MD [facility], [place] [phone]   04/28/2012 2:27 PM

## 2012-04-29 LAB — CBC
HCT: 36.9 % (ref 36.0–46.0)
Hemoglobin: 12.9 g/dL (ref 12.0–15.0)
MCH: 28.8 pg (ref 26.0–34.0)
MCV: 82.4 fL (ref 78.0–100.0)
RBC: 4.48 MIL/uL (ref 3.87–5.11)

## 2012-04-29 MED ORDER — METOPROLOL SUCCINATE ER 100 MG PO TB24
100.0000 mg | ORAL_TABLET | Freq: Every day | ORAL | Status: DC
  Administered 2012-04-29 – 2012-04-30 (×2): 100 mg via ORAL
  Filled 2012-04-29 (×2): qty 1

## 2012-04-29 MED ORDER — DIPHENHYDRAMINE HCL 25 MG PO CAPS
25.0000 mg | ORAL_CAPSULE | Freq: Four times a day (QID) | ORAL | Status: DC | PRN
  Administered 2012-04-29: 25 mg via ORAL
  Filled 2012-04-29: qty 1

## 2012-04-29 MED ORDER — LISINOPRIL 20 MG PO TABS
20.0000 mg | ORAL_TABLET | Freq: Every day | ORAL | Status: DC
  Administered 2012-04-29 – 2012-04-30 (×2): 20 mg via ORAL
  Filled 2012-04-29 (×2): qty 1

## 2012-04-29 MED ORDER — MORPHINE SULFATE 30 MG PO TABS: 90.0000 mg | ORAL_TABLET | Freq: Four times a day (QID) | ORAL | Status: DC | PRN

## 2012-04-29 NOTE — Discharge Summary (Signed)
Physician Discharge Summary  Patient ID: KYNEDI PROFITT MRN: [id number] DOB/AGE: [birth year] 46 y.o.  Admit date: 04/25/2012 Discharge date: 04/29/2012  Admission Diagnoses: partial small bowel obstruction  Discharge Diagnoses: same Active Problems:  * No active hospital problems. *    Discharged Condition: good  Hospital Course: Emma Munoz is a 46 year old female who presented to my office with a partial small bowel traction. She was taken to the OR on September 13 for a lysis of adhesions and small bowel resection. She was sent to the floor in stable condition.  That night she had difficulty with pain control. Her blood pressure was also elevated with diastolics in the 120s. She was placed on a fentanyl PCA and her blood pressure medications were increased.  She continued to have elevated blood pressures throughout the next few days. This coincided with her pain. After postop day 3 her blood pressures returned to what they were in the office with diastolics in the 90s to 100s. By postop day 3 the patient was having bowel movements and a clear liquid diet was started. Of note the patient's Foley was discontinued on postop day 1. The patient was ambulating well. On postop day 4 the patient was switched to a regular diet and her PCA was discontinued. She was placed back on all of her home medicines. By postop day 5 the patient was tolerating a regular diet, ambulating without difficulty and her pain was controlled with by mouth narcotics. It was decided that she was in stable condition for discharge to home. She will return to our clinic next week for staple removal. I have encouraged her not to lift anything greater than 5-10 pounds for 8 weeks to allow for her hernia repair to heal.  Consults: None  Significant Diagnostic Studies: labs: CBC, Chemistries  Treatments: IV hydration, analgesia: Morphine and Fentanyl patch and PCA, cardiac meds: enalapril (Vasotec), metoprolol and hydralazine  and surgery for partial small bowel obstruction.    Discharge Exam: Blood pressure 146/95, pulse 101, temperature 98.2 F (36.8 C), temperature source Oral, resp. rate 20, height 5\' 7"  (1.702 m), weight 214 lb 1.1 oz (97.1 kg), SpO2 100.00%. General appearance: alert, cooperative and no distress Resp: clear to auscultation bilaterally Cardio: regular rate and rhythm GI: soft, appropriately tender to palpation, much improved since surgery Incision/Wound: with staples, no drainage or erythema  Disposition: 01-Home or Self Care     Medication List     As of 04/29/2012  9:30 PM    STOP taking these medications         promethazine 25 MG suppository   Commonly known as: PHENERGAN      TAKE these medications         cyclobenzaprine 10 MG tablet   Commonly known as: FLEXERIL   Take 20 mg by mouth at bedtime.      esomeprazole 40 MG capsule   Commonly known as: NEXIUM   Take 40 mg by mouth daily.      hyoscyamine 0.125 MG SL tablet   Commonly known as: LEVSIN SL   Place 0.125 mg under the tongue every 4 (four) hours as needed. For cramping      LINZESS 290 MCG Caps   Generic drug: Linaclotide   Take 290 mcg by mouth daily.      lisinopril 20 MG tablet   Commonly known as: PRINIVIL,ZESTRIL   Take 20 mg by mouth daily.      metoprolol succinate 100 MG 24 hr tablet  Commonly known as: TOPROL-XL   Take 100 mg by mouth daily. Take with or immediately following a meal.      morphine 30 MG tablet   Commonly known as: MSIR   Take 3 tablets (90 mg total) by mouth every 6 (six) hours as needed. For breakthrough pain      morphine 100 MG 24 hr capsule   Commonly known as: KADIAN   Take 100 mg by mouth 2 (two) times daily.         Signed: Arrin Ishler C. 04/29/2012, 9:30 PM

## 2012-04-29 NOTE — Progress Notes (Signed)
Fentanyl (PCA) wasted in sink.

## 2012-04-29 NOTE — Progress Notes (Signed)
4 Days Post-Op   Subjective: Pain controlled, tolerating small amts of reg diet  Objective: Vital signs in last 24 hours: Temp:  [97.6 F (36.4 C)-99.1 F (37.3 C)] 97.6 F (36.4 C) (09/17 0956) Pulse Rate:  [71-79] 79  (09/17 0956) Resp:  [14-22] 18  (09/17 0956) BP: (116-153)/(72-107) 136/91 mmHg (09/17 0956) SpO2:  [97 %-100 %] 97 % (09/17 0956)   Intake/Output from previous day: 09/16 0701 - 09/17 0700 In: 480 [P.O.:480] Out: 100 [Urine:100] Intake/Output this shift: Total I/O In: 240 [P.O.:240] Out: -    General appearance: alert and no distress GI: soft, less distended, less tender  Incision: clean, dry and intact  Lab Results:   Basename 04/28/12 0530 04/27/12 1014  WBC 14.6* 18.0*  HGB 12.8 13.9  HCT 36.3 38.7  PLT 328 311   BMET  Basename 04/28/12 0530 04/27/12 1014  NA 135 136  K 3.7 3.9  CL 103 102  CO2 23 23  GLUCOSE 126* 112*  BUN 9 7  CREATININE 0.48* 0.46*  CALCIUM 11.7* 12.0*   MEDS, Scheduled    . antiseptic oral rinse  15 mL Mouth Rinse BID  . fentaNYL  25 mcg Transdermal Q72H  . heparin  5,000 Units Subcutaneous Q8H  . metoprolol  20 mg Intravenous Q6H  . morphine  100 mg Oral Q12H  . DISCONTD: fentaNYL   Intravenous Q4H  . DISCONTD: pantoprazole (PROTONIX) IV  40 mg Intravenous QHS    Studies/Results: No results found.  Assessment: s/p Procedure(s): SMALL BOWEL RESECTION HERNIA REPAIR INCISIONAL Patient Active Problem List  Diagnosis  . HYPERLIPIDEMIA  . METABOLIC SYNDROME X  . OBESITY  . ANEMIA-NOS  . TOBACCO ABUSE  . DEPRESSION  . REFLEX SYMPATHETIC DYSTROPHY  . HYPERTENSION  . ALLERGIC RHINITIS  . GERD  . GASTROPARESIS  . HERNIATED CERVICAL DISC  . INSOMNIA  . FATIGUE  . PARESTHESIA  . MIGRAINES, HX OF  . Diabetes mellitus type 2, uncontrolled  . Nausea  . Primary hyperparathyroidism  . Cholelithiasis  . Hypertension  . Preop cardiovascular exam  . Abdominal pain  . Open abdominal wall wound  .  Fatigue  . Abdominal distension  . SBO (small bowel obstruction)  . Hypoparathyroidism  . Anastomotic stricture of small intestine  . Constipation  . IBS (irritable bowel syndrome)  . Partial small bowel obstruction      Plan: D/c MIV, cont diet D/c Fent PCA, will start all home meds today and d/c IV meds Ambulate Hep SQ and SCD's, PPI HTN better: this was a problem at baseline. Cont prn hydralazine, restart home meds Possibly home tomorrow Will recheck cbc today to monitor hgb and wbc levels   LOS: 4 days     .Vanita Panda, MD [facility], [place] [phone]   04/29/2012 10:46 AM

## 2012-04-29 NOTE — Progress Notes (Signed)
Witnessed fentanyl 300 mcg waste in sink

## 2012-04-30 ENCOUNTER — Telehealth (INDEPENDENT_AMBULATORY_CARE_PROVIDER_SITE_OTHER): Payer: Self-pay

## 2012-04-30 ENCOUNTER — Other Ambulatory Visit (HOSPITAL_COMMUNITY): Payer: Medicare Other

## 2012-04-30 MED ORDER — MORPHINE SULFATE 30 MG PO TABS: 90.0000 mg | ORAL_TABLET | ORAL | Status: DC | PRN

## 2012-04-30 NOTE — Progress Notes (Signed)
DC HOME WITH DAUGHTER. VERBALLY UNDERSTOOD DC INSTRUCTIONS. NO QUESTIONS ASKED

## 2012-04-30 NOTE — Telephone Encounter (Signed)
We got a fax to get prior auth for the Morphine 30mg  #120 tablets.  I called and they said if it's for 30days supply then it's ok.  I told her it is.  I called [facility] and let them know at [phone]

## 2012-05-04 ENCOUNTER — Encounter (HOSPITAL_COMMUNITY): Payer: Self-pay | Admitting: Emergency Medicine

## 2012-05-04 ENCOUNTER — Emergency Department (HOSPITAL_COMMUNITY): Payer: Medicare Other

## 2012-05-04 ENCOUNTER — Inpatient Hospital Stay (HOSPITAL_COMMUNITY): Payer: Medicare Other

## 2012-05-04 ENCOUNTER — Inpatient Hospital Stay (HOSPITAL_COMMUNITY)
Admission: EM | Admit: 2012-05-04 | Discharge: 2012-05-07 | DRG: 395 | Discharge status: Against Medical Advice | Payer: Medicare Other | Attending: General Surgery | Admitting: General Surgery

## 2012-05-04 DIAGNOSIS — E785 Hyperlipidemia, unspecified: Secondary | ICD-10-CM | POA: Diagnosis present

## 2012-05-04 DIAGNOSIS — K661 Hemoperitoneum: Principal | ICD-10-CM | POA: Diagnosis present

## 2012-05-04 DIAGNOSIS — J189 Pneumonia, unspecified organism: Secondary | ICD-10-CM

## 2012-05-04 DIAGNOSIS — K5900 Constipation, unspecified: Secondary | ICD-10-CM | POA: Diagnosis present

## 2012-05-04 DIAGNOSIS — I10 Essential (primary) hypertension: Secondary | ICD-10-CM | POA: Diagnosis present

## 2012-05-04 DIAGNOSIS — E210 Primary hyperparathyroidism: Secondary | ICD-10-CM | POA: Diagnosis present

## 2012-05-04 DIAGNOSIS — IMO0001 Reserved for inherently not codable concepts without codable children: Secondary | ICD-10-CM | POA: Diagnosis present

## 2012-05-04 DIAGNOSIS — F17200 Nicotine dependence, unspecified, uncomplicated: Secondary | ICD-10-CM | POA: Diagnosis present

## 2012-05-04 LAB — CBC WITH DIFFERENTIAL/PLATELET
Eosinophils Absolute: 0.1 10*3/uL (ref 0.0–0.7)
Hemoglobin: 11.4 g/dL — ABNORMAL LOW (ref 12.0–15.0)
Lymphs Abs: 2.8 10*3/uL (ref 0.7–4.0)
MCH: 28.5 pg (ref 26.0–34.0)
Monocytes Relative: 10 % (ref 3–12)
Neutro Abs: 12.2 10*3/uL — ABNORMAL HIGH (ref 1.7–7.7)
Neutrophils Relative %: 73 % (ref 43–77)
Platelets: 412 10*3/uL — ABNORMAL HIGH (ref 150–400)
RBC: 4 MIL/uL (ref 3.87–5.11)
WBC: 16.8 10*3/uL — ABNORMAL HIGH (ref 4.0–10.5)

## 2012-05-04 LAB — URINALYSIS, ROUTINE W REFLEX MICROSCOPIC
Glucose, UA: NEGATIVE mg/dL
Protein, ur: 30 mg/dL — AB
pH: 6 (ref 5.0–8.0)

## 2012-05-04 LAB — URINE MICROSCOPIC-ADD ON

## 2012-05-04 LAB — COMPREHENSIVE METABOLIC PANEL
ALT: 21 U/L (ref 0–35)
Albumin: 3.2 g/dL — ABNORMAL LOW (ref 3.5–5.2)
Alkaline Phosphatase: 90 U/L (ref 39–117)
BUN: 5 mg/dL — ABNORMAL LOW (ref 6–23)
Chloride: 100 mEq/L (ref 96–112)
GFR calc Af Amer: >90 mL/min (ref >90)
Glucose, Bld: 122 mg/dL — ABNORMAL HIGH (ref 70–99)
Potassium: 3.1 mEq/L — ABNORMAL LOW (ref 3.5–5.1)
Sodium: 134 mEq/L — ABNORMAL LOW (ref 135–145)
Total Bilirubin: 1.3 mg/dL — ABNORMAL HIGH (ref 0.3–1.2)
Total Protein: 6.5 g/dL (ref 6.0–8.3)

## 2012-05-04 MED ORDER — VANCOMYCIN HCL IN DEXTROSE 1-5 GM/200ML-% IV SOLN
1000.0000 mg | Freq: Once | INTRAVENOUS | Status: AC
  Administered 2012-05-04: 1000 mg via INTRAVENOUS
  Filled 2012-05-04: qty 200

## 2012-05-04 MED ORDER — PANTOPRAZOLE SODIUM 40 MG IV SOLR
40.0000 mg | Freq: Every day | INTRAVENOUS | Status: DC
  Administered 2012-05-04 – 2012-05-06 (×3): 40 mg via INTRAVENOUS
  Filled 2012-05-04 (×4): qty 40

## 2012-05-04 MED ORDER — IOHEXOL 300 MG/ML  SOLN
100.0000 mL | Freq: Once | INTRAMUSCULAR | Status: AC | PRN
  Administered 2012-05-04: 100 mL via INTRAVENOUS

## 2012-05-04 MED ORDER — ONDANSETRON HCL 4 MG/2ML IJ SOLN: 4.0000 mg | Freq: Four times a day (QID) | INTRAMUSCULAR | Status: DC | PRN

## 2012-05-04 MED ORDER — HEPARIN SODIUM (PORCINE) 5000 UNIT/ML IJ SOLN
5000.0000 [IU] | Freq: Three times a day (TID) | INTRAMUSCULAR | Status: DC
  Administered 2012-05-04 – 2012-05-07 (×8): 5000 [IU] via SUBCUTANEOUS
  Filled 2012-05-04 (×11): qty 1

## 2012-05-04 MED ORDER — SODIUM CHLORIDE 0.9 % IV BOLUS (SEPSIS)
1000.0000 mL | Freq: Once | INTRAVENOUS | Status: AC
  Administered 2012-05-04: 1000 mL via INTRAVENOUS

## 2012-05-04 MED ORDER — PIPERACILLIN-TAZOBACTAM 3.375 G IVPB
3.3750 g | Freq: Three times a day (TID) | INTRAVENOUS | Status: DC
  Administered 2012-05-05 – 2012-05-07 (×8): 3.375 g via INTRAVENOUS
  Filled 2012-05-04 (×9): qty 50

## 2012-05-04 MED ORDER — FENTANYL CITRATE 0.05 MG/ML IJ SOLN
100.0000 ug | Freq: Once | INTRAMUSCULAR | Status: AC
  Administered 2012-05-04: 100 ug via INTRAVENOUS

## 2012-05-04 MED ORDER — ACETAMINOPHEN 500 MG PO TABS
1000.0000 mg | ORAL_TABLET | Freq: Once | ORAL | Status: AC
  Administered 2012-05-04: 1000 mg via ORAL
  Filled 2012-05-04: qty 2

## 2012-05-04 MED ORDER — LISINOPRIL 20 MG PO TABS
20.0000 mg | ORAL_TABLET | Freq: Every day | ORAL | Status: DC
Start: 2012-05-05 — End: 2012-05-07
  Administered 2012-05-05 – 2012-05-07 (×3): 20 mg via ORAL
  Filled 2012-05-04 (×3): qty 1

## 2012-05-04 MED ORDER — METOPROLOL SUCCINATE ER 100 MG PO TB24
100.0000 mg | ORAL_TABLET | Freq: Every day | ORAL | Status: DC
Start: 2012-05-05 — End: 2012-05-07
  Administered 2012-05-05 – 2012-05-07 (×3): 100 mg via ORAL
  Filled 2012-05-04 (×3): qty 1

## 2012-05-04 MED ORDER — VANCOMYCIN HCL IN DEXTROSE 1-5 GM/200ML-% IV SOLN
1000.0000 mg | Freq: Three times a day (TID) | INTRAVENOUS | Status: DC
  Administered 2012-05-05 – 2012-05-06 (×5): 1000 mg via INTRAVENOUS
  Filled 2012-05-04 (×6): qty 200

## 2012-05-04 MED ORDER — FENTANYL CITRATE 0.05 MG/ML IJ SOLN
50.0000 ug | Freq: Once | INTRAMUSCULAR | Status: DC
  Filled 2012-05-04: qty 2

## 2012-05-04 MED ORDER — POTASSIUM CHLORIDE 10 MEQ/100ML IV SOLN
10.0000 meq | INTRAVENOUS | Status: AC
  Administered 2012-05-04: 10 meq via INTRAVENOUS
  Filled 2012-05-04: qty 100

## 2012-05-04 MED ORDER — POTASSIUM CHLORIDE IN NACL 20-0.9 MEQ/L-% IV SOLN
INTRAVENOUS | Status: DC
  Administered 2012-05-04 – 2012-05-05 (×2): via INTRAVENOUS
  Filled 2012-05-04 (×3): qty 1000

## 2012-05-04 MED ORDER — PIPERACILLIN-TAZOBACTAM 3.375 G IVPB: 3.3750 g | Freq: Three times a day (TID) | INTRAVENOUS | Status: DC

## 2012-05-04 MED ORDER — CYCLOBENZAPRINE HCL 10 MG PO TABS
20.0000 mg | ORAL_TABLET | Freq: Every day | ORAL | Status: DC
  Administered 2012-05-05 – 2012-05-06 (×2): 20 mg via ORAL
  Filled 2012-05-04 (×4): qty 2

## 2012-05-04 MED ORDER — PIPERACILLIN-TAZOBACTAM 3.375 G IVPB 30 MIN
3.3750 g | Freq: Once | INTRAVENOUS | Status: AC
  Administered 2012-05-04: 3.375 g via INTRAVENOUS
  Filled 2012-05-04: qty 50

## 2012-05-04 MED ORDER — ONDANSETRON HCL 4 MG/2ML IJ SOLN
4.0000 mg | Freq: Once | INTRAMUSCULAR | Status: AC
  Administered 2012-05-04: 4 mg via INTRAVENOUS
  Filled 2012-05-04: qty 2

## 2012-05-04 MED ORDER — MORPHINE SULFATE 4 MG/ML IJ SOLN
4.0000 mg | INTRAMUSCULAR | Status: DC | PRN
  Administered 2012-05-05 (×5): 4 mg via INTRAVENOUS
  Administered 2012-05-05 – 2012-05-06 (×2): 6 mg via INTRAVENOUS
  Administered 2012-05-06: 8 mg via INTRAVENOUS
  Filled 2012-05-04 (×2): qty 1
  Filled 2012-05-04 (×3): qty 2
  Filled 2012-05-04 (×3): qty 1

## 2012-05-04 MED ORDER — MORPHINE SULFATE ER 100 MG PO TBCR
100.0000 mg | EXTENDED_RELEASE_TABLET | Freq: Two times a day (BID) | ORAL | Status: DC
  Administered 2012-05-05 – 2012-05-07 (×5): 100 mg via ORAL
  Filled 2012-05-04 (×5): qty 1

## 2012-05-04 NOTE — ED Notes (Signed)
MD at bedside. 

## 2012-05-04 NOTE — ED Notes (Signed)
Dr. Johna Sheriff at pts bedside

## 2012-05-04 NOTE — ED Notes (Signed)
Call [phone] Dr. Johna Sheriff to see after x-ray. Do not wait until lab results are back.

## 2012-05-04 NOTE — Progress Notes (Signed)
Dr Johna Sheriff called and notified of CT results take 9/22 at 1930.  No new orders at this time.

## 2012-05-04 NOTE — ED Notes (Signed)
Pt presents per Dr. Johna Sheriff to be seen for  fever post abd syurgey 9/13. Pt temp 99.9 with 1000mg  Tylenol @ 11:30 am today.

## 2012-05-04 NOTE — ED Notes (Signed)
Pt to xray

## 2012-05-04 NOTE — ED Notes (Signed)
[place] pager [phone]. rn will page when results are back.

## 2012-05-04 NOTE — Progress Notes (Signed)
ANTIBIOTIC CONSULT NOTE - INITIAL  Pharmacy Consult for:  Vancomycin Indication:  Fever, abdominal pain, and leukocytosis 9 days after abdominal surgery  Allergies  Allergen Reactions  . Gadolinium Derivatives     Pt states nausea and vomiting to GAD in MRI pt states she does ok with iv/cm in cat scan  . Aspirin     REACTION: Tongue, throat, extremities swell  . Dilaudid (Hydromorphone Hcl) Nausea And Vomiting  . Dye Fdc Red (Red Dye)   . Ibuprofen     REACTION: Tongue, throat, extremities swell  . Latex Hives  . Nsaids     REACTION: Tongue, throat, extremities swell    Patient Measurements: Height: 5\' 7"  (170.2 cm) Weight: 200 lb (90.719 kg) IBW/kg (Calculated) : 61.6   Vital Signs: Temp: 98.9 F (37.2 C) (09/22 2010) Temp src: Oral (09/22 2010) BP: 108/77 mmHg (09/22 2010) Pulse Rate: 91  (09/22 2010)  Labs:  Basename 05/04/12 1520  WBC 16.8*  HGB 11.4*  PLT 412*  LABCREA --  CREATININE 0.56   Estimated Creatinine Clearance: 103.7 ml/min (by C-G formula based on Cr of 0.56).  Microbiology: Recent Results (from the past 720 hour(s))  SURGICAL PCR SCREEN     Status: Normal   Collection Time   04/24/12  2:02 PM      Component Value Range Status Comment   MRSA, PCR NEGATIVE  NEGATIVE Final    Staphylococcus aureus NEGATIVE  NEGATIVE Final     Medical History: Past Medical History  Diagnosis Date  . Hypertension   . Tobacco abuse   . IBS (irritable bowel syndrome)   . Constipation   . Hyperthyroidism   . Heart murmur   . Hyperglycemia   . Diabetes mellitus     for 6 weeks only 09/2011-11/2011    Medications:  Scheduled:    . acetaminophen  1,000 mg Oral Once  . cyclobenzaprine  20 mg Oral QHS  . fentaNYL  100 mcg Intravenous Once  . heparin  5,000 Units Subcutaneous Q8H  . lisinopril  20 mg Oral Daily  . metoprolol succinate  100 mg Oral Daily  . morphine  100 mg Oral BID  . ondansetron (ZOFRAN) IV  4 mg Intravenous Once  . pantoprazole  (PROTONIX) IV  40 mg Intravenous QHS  . piperacillin-tazobactam  3.375 g Intravenous Once  . piperacillin-tazobactam (ZOSYN)  IV  3.375 g Intravenous Q8H  . potassium chloride  10 mEq Intravenous Q1 Hr x 3  . sodium chloride  1,000 mL Intravenous Once  . vancomycin  1,000 mg Intravenous Once  . DISCONTD: fentaNYL  50 mcg Intravenous Once  . DISCONTD: piperacillin-tazobactam (ZOSYN)  IV  3.375 g Intravenous Q8H   Assessment:  Asked to assist with Vancomycin therapy for this 46 year-old female with fever, abdominal pain, WBCs 16.8, and recent abdominal surgery.  Also receiving Zosyn.  Doses of both antibiotics were given in the ED earlier today.  Blood cultures are pending  Goals of Therapy:   Vancomycin trough levels 15-20 mcg/ml  Eradication of infection  Plan:   Vancomycin 1000 mg IV every 8 hours  Levels as needed to guide dosing.  Polo Riley R.Ph. 05/04/2012 9:27 PM

## 2012-05-04 NOTE — ED Notes (Addendum)
Report given to dawn, rn on floor  rn will call before pt is transported upstairs

## 2012-05-04 NOTE — H&P (Signed)
Emma Munoz is an 46 y.o. female.   Chief Complaint: fever and abdominal pain  HPI: the patient is a 46 year old female who 9 days ago underwent laparotomy with small bowel resection for a small bowel anastomotic stricture as well as repair of an incisional hernia. Her hospital course was relatively uncomplicated she was discharged 4 days ago. She initially felt okay but yesterday had an episode where she felt feverish and chilled although she did not take her temperature. This was associated with extreme fatigue and some malaise. At that time she was only having her expected abdominal soreness. Today again she developed subjective fever but also increasing abdominal pain which has been generalized. It comes and goes but is worse when she tries to eat. She has felt some nausea. No vomiting. She has not had a bowel movement since she left the hospital. She continues to feel weak. She has no urinary symptoms. No calf pain or swelling. No incisional drainage or redness. She called today and asked to come to the emergency room for evaluation.  Past Medical History  Diagnosis Date  . Hypertension   . Tobacco abuse   . IBS (irritable bowel syndrome)   . Constipation   . Hyperthyroidism   . Heart murmur   . Hyperglycemia   . Diabetes mellitus     for 6 weeks only 09/2011-11/2011    Past Surgical History  Procedure Date  . Laparoscopic hysterectomy 2005  . Cesarean section 85, 87, 95  . Lumbar disc surgery     x2  . Knee surgery     Realignment surgery  . Hand surgery   . Ulnar nerve repair   . Rotator cuff repair   . Nerve stimulator in lumbar spine   . Cholecystectomy     With complications  . Myomectomy   . Abdominal surgery   . Bowel resection 04/25/2012    Procedure: SMALL BOWEL RESECTION;  Surgeon: Romie Levee, MD;  Location: [facility] OR;  Service: General;  Laterality: N/A;  small bowell resection, incisional hernia repair  . Incisional hernia repair 04/25/2012    Procedure: HERNIA  REPAIR INCISIONAL;  Surgeon: Romie Levee, MD;  Location: [facility] OR;  Service: General;  Laterality: N/A;    Family History  Problem Relation Age of Onset  . Early death Father 81    GUNSHOT  . Kidney disease Brother     s/p transplant  . Heart disease Maternal Grandmother     Heart defect  . Prostate cancer Maternal Grandfather   . Cancer Maternal Grandfather     prostate   Social History:  reports that she has been smoking Cigarettes.  She has a 20 pack-year smoking history. She has never used smokeless tobacco. She reports that she does not drink alcohol or use illicit drugs.  Allergies:  Allergies  Allergen Reactions  . Gadolinium Derivatives     Pt states nausea and vomiting to GAD in MRI pt states she does ok with iv/cm in cat scan  . Aspirin     REACTION: Tongue, throat, extremities swell  . Dilaudid (Hydromorphone Hcl) Nausea And Vomiting  . Dye Fdc Red (Red Dye)   . Ibuprofen     REACTION: Tongue, throat, extremities swell  . Latex Hives  . Nsaids     REACTION: Tongue, throat, extremities swell     (Not in a hospital admission)  Results for orders placed during the hospital encounter of 05/04/12 (from the past 48 hour(s))  CBC WITH DIFFERENTIAL  Status: Abnormal   Collection Time   05/04/12  3:20 PM      Component Value Range Comment   WBC 16.8 (*) 4.0 - 10.5 K/uL    RBC 4.00  3.87 - 5.11 MIL/uL    Hemoglobin 11.4 (*) 12.0 - 15.0 g/dL    HCT 96.0 (*) 45.4 - 46.0 %    MCV 81.5  78.0 - 100.0 fL    MCH 28.5  26.0 - 34.0 pg    MCHC 35.0  30.0 - 36.0 g/dL    RDW 09.8  11.9 - 14.7 %    Platelets 412 (*) 150 - 400 K/uL    Neutrophils Relative 73  43 - 77 %    Neutro Abs 12.2 (*) 1.7 - 7.7 K/uL    Lymphocytes Relative 17  12 - 46 %    Lymphs Abs 2.8  0.7 - 4.0 K/uL    Monocytes Relative 10  3 - 12 %    Monocytes Absolute 1.6 (*) 0.1 - 1.0 K/uL    Eosinophils Relative 1  0 - 5 %    Eosinophils Absolute 0.1  0.0 - 0.7 K/uL    Basophils Relative 0  0 - 1 %     Basophils Absolute 0.0  0.0 - 0.1 K/uL   COMPREHENSIVE METABOLIC PANEL     Status: Abnormal   Collection Time   05/04/12  3:20 PM      Component Value Range Comment   Sodium 134 (*) 135 - 145 mEq/L    Potassium 3.1 (*) 3.5 - 5.1 mEq/L    Chloride 100  96 - 112 mEq/L    CO2 27  19 - 32 mEq/L    Glucose, Bld 122 (*) 70 - 99 mg/dL    BUN 5 (*) 6 - 23 mg/dL    Creatinine, Ser 8.29  0.50 - 1.10 mg/dL    Calcium 56.2 (*) 8.4 - 10.5 mg/dL    Total Protein 6.5  6.0 - 8.3 g/dL    Albumin 3.2 (*) 3.5 - 5.2 g/dL    AST 30  0 - 37 U/L    ALT 21  0 - 35 U/L    Alkaline Phosphatase 90  39 - 117 U/L    Total Bilirubin 1.3 (*) 0.3 - 1.2 mg/dL    GFR calc non Af Amer >90  >90 mL/min    GFR calc Af Amer >90  >90 mL/min   URINALYSIS, ROUTINE W REFLEX MICROSCOPIC     Status: Abnormal   Collection Time   05/04/12  3:35 PM      Component Value Range Comment   Color, Urine ORANGE (*) YELLOW BIOCHEMICALS MAY BE AFFECTED BY COLOR   APPearance CLOUDY (*) CLEAR    Specific Gravity, Urine 1.026  1.005 - 1.030    pH 6.0  5.0 - 8.0    Glucose, UA NEGATIVE  NEGATIVE mg/dL    Hgb urine dipstick NEGATIVE  NEGATIVE    Bilirubin Urine SMALL (*) NEGATIVE    Ketones, ur TRACE (*) NEGATIVE mg/dL    Protein, ur 30 (*) NEGATIVE mg/dL    Urobilinogen, UA >1.3 (*) 0.0 - 1.0 mg/dL    Nitrite NEGATIVE  NEGATIVE    Leukocytes, UA TRACE (*) NEGATIVE   URINE MICROSCOPIC-ADD ON     Status: Abnormal   Collection Time   05/04/12  3:35 PM      Component Value Range Comment   Squamous Epithelial / LPF FEW (*) RARE  WBC, UA 0-2  <3 WBC/hpf    Bacteria, UA RARE  RARE    Crystals CA OXALATE CRYSTALS (*) NEGATIVE    Urine-Other MUCOUS PRESENT     LACTIC ACID, PLASMA     Status: Normal   Collection Time   05/04/12  4:07 PM      Component Value Range Comment   Lactic Acid, Venous 0.8  0.5 - 2.2 mmol/L    Dg Abd Acute W/chest  05/04/2012  *RADIOLOGY REPORT*  Clinical Data: Recent history of abdominal surgery on the  04/25/2012 for hernia and bowel repair.  Abdominal pain for the past 3 days.  Fever and nausea.  ACUTE ABDOMEN SERIES (ABDOMEN 2 VIEW & CHEST 1 VIEW)  Comparison: 04/06/2012.  Findings: Lung volumes are normal.  Slight prominence of the interstitial markings throughout the lung bases may reflect some resolving mild postoperative edema and/or sequelae of mild aspiration.  The remainder the lungs appear well aerated, without acute consolidative airspace disease or pleural effusions.  There is only mild cephalization of the pulmonary vasculature at this time, without evidence of edema in the mid and upper lungs.  The heart size is normal.  Mediastinal contours are unremarkable. Wires extend up into the cervical region, likely from spinal cord stimulator.  No pneumoperitoneum.  Supine and upright views of the abdomen demonstrate gas and stool scattered throughout the colon extending to the distal rectum. There is one mildly dilated loop of small bowel seen projecting over the left side of the central abdomen, with some small air fluid levels scattered.  Lower midline abdominal skin staples are noted.  Surgical clips project over the right upper quadrant of the abdomen, consistent with history of prior cholecystectomy.  There is an electronic device in place projecting over the right lower flank posteriorly.  IMPRESSION: 1.  Nonspecific bowel gas pattern, as above.  Findings do not strongly favor a bowel obstruction, and are most favored to represent a very mild postoperative ileus. 2.  Appearance of the lung bases may suggest some resolving areas of postoperative edema, atelectasis and/or sequelae of mild aspiration. 3.  Pulmonary vascular congestion.   Original Report Authenticated By: Florencia Reasons, M.D.     Review of Systems  Constitutional: Positive for fever, chills and malaise/fatigue.  Respiratory: Negative.   Cardiovascular: Negative.   Gastrointestinal: Positive for nausea, abdominal pain and  constipation. Negative for vomiting and diarrhea.  Genitourinary: Negative.   Neurological: Positive for weakness.    Blood pressure 128/86, pulse 96, temperature 99.9 F (37.7 C), temperature source Oral, resp. rate 20, SpO2 96.00%. Physical Exam  General: Overweight African American female who does not appear in distress Skin: Warm and dry without rash or infection HEENT: Sclera nonicteric. Oropharynx clear. No masses. Lymph nodes: No cervical, supraclavicular, or inguinal nodes palpable Lungs: Clear equal breath sounds without wheezing or increased work of breathing Cardiovascular: Regular rate and rhythm. No murmur. No edema. Abdomen: Healing midline incision without evidence of infection. Abdomen may be mildly distended. There is mild to moderate diffuse tenderness without guarding but more localized tenderness in the left lower quadrant with some guarding here. No discernible masses or organomegaly. Extremities: No edema or calf tenderness Neurologic: She is alert and fully oriented. No gross motor deficits. Affect normal  Assessment/Plan Fever and abdominal pain, elevated white blood count 9 days following laparotomy with small bowel resection and incisional hernia repair. KUB shows one dilated loop of bowel but seems most consistent with ileus. There is a little  haziness on her lung bases on chest x-ray but to get some likely to be a source of her fever and white count. The patient has been started on broad-spectrum antibiotics. She will be admitted and will obtain a CT scan of the abdomen and pelvis to rule out abscess or other cause of her pain.  Jennet Scroggin T 05/04/2012, 5:15 PM

## 2012-05-04 NOTE — ED Provider Notes (Signed)
History     CSN: [id number]  Arrival date & time 05/04/12  1426   First MD Initiated Contact with Patient 05/04/12 1529      Chief Complaint  Patient presents with  . Fever    (Consider location/radiation/quality/duration/timing/severity/associated sxs/prior treatment) The history is provided by the patient.  Emma Munoz is a 46 y.o. female hx of HTN, IBS, recent lysis of adhesion a week ago for SBO here with fever, abdominal pain. She was d/c home 5 days ago from the hospital and since yesterday, had subjective fever (101 today). She also had progressively worse abdominal pain and nausea. No vomiting and still passing gas. She has some dysuria this AM as well but denies cough or SOB or CP.    Past Medical History  Diagnosis Date  . Hypertension   . Tobacco abuse   . IBS (irritable bowel syndrome)   . Constipation   . Hyperthyroidism   . Heart murmur   . Hyperglycemia   . Diabetes mellitus     for 6 weeks only 09/2011-11/2011    Past Surgical History  Procedure Date  . Laparoscopic hysterectomy 2005  . Cesarean section 85, 87, 95  . Lumbar disc surgery     x2  . Knee surgery     Realignment surgery  . Hand surgery   . Ulnar nerve repair   . Rotator cuff repair   . Nerve stimulator in lumbar spine   . Cholecystectomy     With complications  . Myomectomy   . Abdominal surgery   . Bowel resection 04/25/2012    Procedure: SMALL BOWEL RESECTION;  Surgeon: Romie Levee, MD;  Location: [facility] OR;  Service: General;  Laterality: N/A;  small bowell resection, incisional hernia repair  . Incisional hernia repair 04/25/2012    Procedure: HERNIA REPAIR INCISIONAL;  Surgeon: Romie Levee, MD;  Location: [facility] OR;  Service: General;  Laterality: N/A;    Family History  Problem Relation Age of Onset  . Early death Father 11    GUNSHOT  . Kidney disease Brother     s/p transplant  . Heart disease Maternal Grandmother     Heart defect  . Prostate cancer Maternal Grandfather    . Cancer Maternal Grandfather     prostate    History  Substance Use Topics  . Smoking status: Current Every Day Smoker -- 1.0 packs/day for 20 years    Types: Cigarettes  . Smokeless tobacco: Never Used  . Alcohol Use: No     Rarely    OB History    Grav Para Term Preterm Abortions TAB SAB Ect Mult Living                  Review of Systems  Constitutional: Positive for fever and chills.  Gastrointestinal: Positive for nausea and abdominal pain.  Genitourinary: Positive for dysuria.  All other systems reviewed and are negative.    Allergies  Gadolinium derivatives; Aspirin; Dilaudid; Dye fdc red; Ibuprofen; Latex; and Nsaids  Home Medications   Current Outpatient Rx  Name Route Sig Dispense Refill  . CYCLOBENZAPRINE HCL 10 MG PO TABS Oral Take 20 mg by mouth at bedtime.    Marland Kitchen ESOMEPRAZOLE MAGNESIUM 40 MG PO CPDR Oral Take 40 mg by mouth daily.    Marland Kitchen LINACLOTIDE 290 MCG PO CAPS Oral Take 290 mcg by mouth daily.     Marland Kitchen LISINOPRIL 20 MG PO TABS Oral Take 20 mg by mouth daily.    Marland Kitchen  METOPROLOL SUCCINATE ER 100 MG PO TB24 Oral Take 100 mg by mouth daily. Take with or immediately following a meal.    . MORPHINE SULFATE ER 100 MG PO CP24 Oral Take 100 mg by mouth 2 (two) times daily.     . MORPHINE SULFATE 30 MG PO TABS Oral Take 3 tablets (90 mg total) by mouth every 4 (four) hours as needed. For breakthrough pain 120 tablet 0    BP 128/86  Pulse 96  Temp 99.9 F (37.7 C) (Oral)  Resp 20  SpO2 96%  Physical Exam  Nursing note and vitals reviewed. Constitutional: She is oriented to person, place, and time. She appears well-developed.       Uncomfortable   HENT:  Head: Normocephalic.       MM dry  Eyes: Conjunctivae normal are normal. Pupils are equal, round, and reactive to light.  Neck: Normal range of motion. Neck supple.  Cardiovascular: Normal rate and normal heart sounds.   Pulmonary/Chest: Effort normal and breath sounds normal.  Abdominal: Soft.        Distended, diffuse tenderness, worse along the vertical staple site. Staples appear healing well and no obvious erythema or drainage from wound.   Musculoskeletal: Normal range of motion.  Neurological: She is alert and oriented to person, place, and time.  Skin: Skin is warm and dry.  Psychiatric: She has a normal mood and affect. Her behavior is normal. Judgment and thought content normal.    ED Course  Procedures (including critical care time)  Labs Reviewed  CBC WITH DIFFERENTIAL - Abnormal; Notable for the following:    WBC 16.8 (*)     Hemoglobin 11.4 (*)     HCT 32.6 (*)     Platelets 412 (*)     Neutro Abs 12.2 (*)     Monocytes Absolute 1.6 (*)     All other components within normal limits  COMPREHENSIVE METABOLIC PANEL - Abnormal; Notable for the following:    Sodium 134 (*)     Potassium 3.1 (*)     Glucose, Bld 122 (*)     BUN 5 (*)     Calcium 11.1 (*)     Albumin 3.2 (*)     Total Bilirubin 1.3 (*)     All other components within normal limits  URINALYSIS, ROUTINE W REFLEX MICROSCOPIC - Abnormal; Notable for the following:    Color, Urine ORANGE (*)  BIOCHEMICALS MAY BE AFFECTED BY COLOR   APPearance CLOUDY (*)     Bilirubin Urine SMALL (*)     Ketones, ur TRACE (*)     Protein, ur 30 (*)     Urobilinogen, UA >8.0 (*)     Leukocytes, UA TRACE (*)     All other components within normal limits  URINE MICROSCOPIC-ADD ON - Abnormal; Notable for the following:    Squamous Epithelial / LPF FEW (*)     Crystals CA OXALATE CRYSTALS (*)     All other components within normal limits  LACTIC ACID, PLASMA  CULTURE, BLOOD (ROUTINE X 2)  CULTURE, BLOOD (ROUTINE X 2)   Dg Abd Acute W/chest  05/04/2012  *RADIOLOGY REPORT*  Clinical Data: Recent history of abdominal surgery on the 04/25/2012 for hernia and bowel repair.  Abdominal pain for the past 3 days.  Fever and nausea.  ACUTE ABDOMEN SERIES (ABDOMEN 2 VIEW & CHEST 1 VIEW)  Comparison: 04/06/2012.  Findings: Lung  volumes are normal.  Slight prominence of the interstitial markings  throughout the lung bases may reflect some resolving mild postoperative edema and/or sequelae of mild aspiration.  The remainder the lungs appear well aerated, without acute consolidative airspace disease or pleural effusions.  There is only mild cephalization of the pulmonary vasculature at this time, without evidence of edema in the mid and upper lungs.  The heart size is normal.  Mediastinal contours are unremarkable. Wires extend up into the cervical region, likely from spinal cord stimulator.  No pneumoperitoneum.  Supine and upright views of the abdomen demonstrate gas and stool scattered throughout the colon extending to the distal rectum. There is one mildly dilated loop of small bowel seen projecting over the left side of the central abdomen, with some small air fluid levels scattered.  Lower midline abdominal skin staples are noted.  Surgical clips project over the right upper quadrant of the abdomen, consistent with history of prior cholecystectomy.  There is an electronic device in place projecting over the right lower flank posteriorly.  IMPRESSION: 1.  Nonspecific bowel gas pattern, as above.  Findings do not strongly favor a bowel obstruction, and are most favored to represent a very mild postoperative ileus. 2.  Appearance of the lung bases may suggest some resolving areas of postoperative edema, atelectasis and/or sequelae of mild aspiration. 3.  Pulmonary vascular congestion.   Original Report Authenticated By: Florencia Reasons, M.D.      1. Healthcare-associated pneumonia       MDM  Emma Munoz is a 46 y.o. female hx of SBO s/p lysis of adhesions here with post op fever, abdominal pain. Will need a sepsis workup. Also will get xray ab/pel to r/o SBO.   5:06 PM Labs showed WBC 17, CMP nl. Lactate nl. CXR showed ? Infiltrate, UA  Contaminated. I gave her Vanc/zosyn to cover for possible health care associated  pneumonia. Abdomen xray showed no SBO, just ileus. I discussed with Dr. Johna Sheriff (surgeon), who will admit the patient and will examine her in the ED.        Richardean Canal, MD 05/04/12 [phone]

## 2012-05-04 NOTE — ED Notes (Signed)
Pt to CT

## 2012-05-05 LAB — BASIC METABOLIC PANEL
CO2: 26 mEq/L (ref 19–32)
Chloride: 103 mEq/L (ref 96–112)
Glucose, Bld: 102 mg/dL — ABNORMAL HIGH (ref 70–99)
Potassium: 3.2 mEq/L — ABNORMAL LOW (ref 3.5–5.1)
Sodium: 136 mEq/L (ref 135–145)

## 2012-05-05 LAB — CBC
Hemoglobin: 10 g/dL — ABNORMAL LOW (ref 12.0–15.0)
MCH: 28.3 pg (ref 26.0–34.0)
RBC: 3.53 MIL/uL — ABNORMAL LOW (ref 3.87–5.11)
WBC: 12.2 10*3/uL — ABNORMAL HIGH (ref 4.0–10.5)

## 2012-05-05 MED ORDER — POTASSIUM CHLORIDE CRYS ER 20 MEQ PO TBCR
40.0000 meq | EXTENDED_RELEASE_TABLET | Freq: Two times a day (BID) | ORAL | Status: AC
  Administered 2012-05-05: 40 meq via ORAL
  Filled 2012-05-05 (×2): qty 2

## 2012-05-05 MED ORDER — HYDRALAZINE HCL 20 MG/ML IJ SOLN: 5.0000 mg | Freq: Four times a day (QID) | INTRAMUSCULAR | Status: DC | PRN

## 2012-05-05 MED ORDER — KCL IN DEXTROSE-NACL 20-5-0.45 MEQ/L-%-% IV SOLN
INTRAVENOUS | Status: DC
  Administered 2012-05-05: 13:00:00 via INTRAVENOUS
  Filled 2012-05-05 (×2): qty 1000

## 2012-05-05 NOTE — Progress Notes (Signed)
Subjective: Pt readmitted for increased abd pain on Sat that was sudden onset.  Since then, she has continued to have pain with minimal nausea and low grade fevers.  She was placed in the hospital on IV abx and her wbc has gone down, but she continues to have low grade fevers.  Objective: Vital signs in last 24 hours: Temp:  [98.4 F (36.9 C)-100.8 F (38.2 C)] 98.4 F (36.9 C) (09/23 1400) Pulse Rate:  [83-113] 105  (09/23 1400) Resp:  [15-23] 18  (09/23 1400) BP: (108-149)/(71-100) 149/100 mmHg (09/23 1400) SpO2:  [83 %-100 %] 92 % (09/23 1400) Weight:  [200 lb (90.719 kg)] 200 lb (90.719 kg) (09/22 2010)   Intake/Output from previous day: 09/22 0701 - 09/23 0700 In: 816.7 [I.V.:816.7] Out: 700 [Urine:700] Intake/Output this shift: Total I/O In: 301.7 [P.O.:240; I.V.:61.7] Out: 1750 [Urine:1750]   General appearance: alert, cooperative and mild distress Resp: clear to auscultation bilaterally Cardio: regular rate and rhythm GI: normal findings: no distention and abnormal findings:  obese and mild tenderness to palpation on left side Extremities: no edema, redness or tenderness in the calves or thighs  Incision: healing well, no significant drainage, no significant erythema  Lab Results:   Basename 05/05/12 0425 05/04/12 1520  WBC 12.2* 16.8*  HGB 10.0* 11.4*  HCT 28.9* 32.6*  PLT 310 412*   BMET  Basename 05/05/12 0425 05/04/12 1520  NA 136 134*  K 3.2* 3.1*  CL 103 100  CO2 26 27  GLUCOSE 102* 122*  BUN 4* 5*  CREATININE 0.64 0.56  CALCIUM 9.8 11.1*   PT/INR No results found for this basename: LABPROT:2,INR:2 in the last 72 hours ABG No results found for this basename: PHART:2,PCO2:2,PO2:2,HCO3:2 in the last 72 hours  MEDS, Scheduled    . acetaminophen  1,000 mg Oral Once  . cyclobenzaprine  20 mg Oral QHS  . fentaNYL  100 mcg Intravenous Once  . heparin  5,000 Units Subcutaneous Q8H  . lisinopril  20 mg Oral Daily  . metoprolol succinate  100  mg Oral Daily  . morphine  100 mg Oral BID  . ondansetron (ZOFRAN) IV  4 mg Intravenous Once  . pantoprazole (PROTONIX) IV  40 mg Intravenous QHS  . piperacillin-tazobactam  3.375 g Intravenous Once  . piperacillin-tazobactam (ZOSYN)  IV  3.375 g Intravenous Q8H  . potassium chloride  10 mEq Intravenous Q1 Hr x 3  . potassium chloride  40 mEq Oral BID  . sodium chloride  1,000 mL Intravenous Once  . vancomycin  1,000 mg Intravenous Once  . vancomycin  1,000 mg Intravenous Q8H  . DISCONTD: fentaNYL  50 mcg Intravenous Once  . DISCONTD: piperacillin-tazobactam (ZOSYN)  IV  3.375 g Intravenous Q8H    Studies/Results: Ct Abdomen Pelvis W Contrast  05/04/2012  *RADIOLOGY REPORT*  Clinical Data: 46 year old female status post ventral hernia repair.  Fever and abdominal pain. Postop day #9.  Laparotomy with small bowel resection for stricture plus incisional hernia.  CT ABDOMEN AND PELVIS WITH CONTRAST  Technique:  Multidetector CT imaging of the abdomen and pelvis was performed following the standard protocol during bolus administration of intravenous contrast.  Contrast: OMNIPAQUE IOHEXOL 300 MG/ML  SOLN  Comparison: 04/06/2012 and earlier.  Findings: Preoperative study 04/06/2012.  No pericardial or pleural effusion.  There is patchy and confluent dependent pulmonary opacity.  Less pronounced opacity also in the lingula.  This is somewhat advanced for atelectasis, but there are no air bronchograms.  Stable visualized  osseous structures.  Right-sided spinal stimulator device partially visible.  Previous postoperative changes to the right lower lumbar spine.  There is a small volume of slightly dense/complex fluid in the pelvis (series 2 image 76, Hounsfield units 23-24).  Uterus appears be chronically surgically absent but both adnexa are felt to remain.  The fluid is located anterior to the rectum which appears within normal limits.  The bladder is fairly unremarkable.  The sigmoid colon is  redundant with mild wall thickening.  The distal descending colon is thick-walled, but by the hepatic flexure the colon appears within normal limits.  Ventral abdominal postoperative changes are noted with a small gas fluid collection in the subcutaneous fat measuring 35 x 13 x 36 mm. This tracks beneath the skin incision.  A somewhat denser collection along the caudal aspect of the scan incision more resembles of small postoperative hematoma.  No pneumoperitoneum.  The transverse colon in both flexures are distended mildly with a mix of fluid and stool.  Oral contrast has reached the cecum and ascending colon.  The appendix and gallbladder are chronically surgically absent.  No definite extravasated contrast, but tracking from the root of the small bowel mesentery through the central mesentery in the left and central abdomen is an amorphous hyperdense collection (50 HU) encompassing 4.8 x 9.1 x 14.3 cm (AP by transverse by CC).  Some of the surrounding small bowel loops are indistinct/inflamed (series 2 image 44) but not abnormally dilated.  No other convincing abdominal fluid or fluid collection.  Decreased density in the liver.  Spleen, pancreas (fatty atrophy), adrenal glands, stomach and duodenum are within normal limits. Kidneys are stable and within normal limits.  Main portal venous system appears to be patent.  Major arterial structures appear patent.  IMPRESSION: 1.  Appearance most compatible with a moderate sized postoperative hematoma in the small bowel mesentery as detailed above.  Some associated inflammation of adjacent small bowel loops, but no obstruction or extravasated oral contrast (contrast has reached the cecum). 2.  Small volume of more simple appearing fluid (may be evolved blood or seroma fluid) in the pelvis. 3.  Small volume mixed of postoperative blood and air fluid collection in the subcutaneous abdomen underlying the incision site.  The air fluid collection might represent a developing  abscess but does not appear organized and currently measures 35 x 13 x 36 mm. 4.  Dependent pulmonary opacity may reflect atelectasis but developing lower lobe infection is difficult to exclude.   Original Report Authenticated By: Harley Hallmark, M.D.    Dg Abd Acute W/chest  05/04/2012  *RADIOLOGY REPORT*  Clinical Data: Recent history of abdominal surgery on the 04/25/2012 for hernia and bowel repair.  Abdominal pain for the past 3 days.  Fever and nausea.  ACUTE ABDOMEN SERIES (ABDOMEN 2 VIEW & CHEST 1 VIEW)  Comparison: 04/06/2012.  Findings: Lung volumes are normal.  Slight prominence of the interstitial markings throughout the lung bases may reflect some resolving mild postoperative edema and/or sequelae of mild aspiration.  The remainder the lungs appear well aerated, without acute consolidative airspace disease or pleural effusions.  There is only mild cephalization of the pulmonary vasculature at this time, without evidence of edema in the mid and upper lungs.  The heart size is normal.  Mediastinal contours are unremarkable. Wires extend up into the cervical region, likely from spinal cord stimulator.  No pneumoperitoneum.  Supine and upright views of the abdomen demonstrate gas and stool scattered throughout the colon  extending to the distal rectum. There is one mildly dilated loop of small bowel seen projecting over the left side of the central abdomen, with some small air fluid levels scattered.  Lower midline abdominal skin staples are noted.  Surgical clips project over the right upper quadrant of the abdomen, consistent with history of prior cholecystectomy.  There is an electronic device in place projecting over the right lower flank posteriorly.  IMPRESSION: 1.  Nonspecific bowel gas pattern, as above.  Findings do not strongly favor a bowel obstruction, and are most favored to represent a very mild postoperative ileus. 2.  Appearance of the lung bases may suggest some resolving areas of  postoperative edema, atelectasis and/or sequelae of mild aspiration. 3.  Pulmonary vascular congestion.   Original Report Authenticated By: Florencia Reasons, M.D.     Assessment: s/p  Patient Active Problem List  Diagnosis  . HYPERLIPIDEMIA  . METABOLIC SYNDROME X  . OBESITY  . ANEMIA-NOS  . TOBACCO ABUSE  . DEPRESSION  . REFLEX SYMPATHETIC DYSTROPHY  . HYPERTENSION  . ALLERGIC RHINITIS  . GERD  . GASTROPARESIS  . HERNIATED CERVICAL DISC  . INSOMNIA  . FATIGUE  . PARESTHESIA  . MIGRAINES, HX OF  . Diabetes mellitus type 2, uncontrolled  . Nausea  . Primary hyperparathyroidism  . Hypertension  . Preop cardiovascular exam  . Abdominal pain  . Open abdominal wall wound  . Fatigue  . Hypoparathyroidism  . Constipation  . IBS (irritable bowel syndrome)  CT scan:  IMPRESSION:  1. Appearance most compatible with a moderate sized postoperative hematoma in the small bowel mesentery as detailed above. Some associated inflammation of adjacent small bowel loops, but no obstruction or extravasated oral contrast (contrast has reached the cecum).  2. Small volume of more simple appearing fluid (may be evolved blood or seroma fluid) in the pelvis.  3. Small volume mixed of postoperative blood and air fluid collection in the subcutaneous abdomen underlying the incision site. The air fluid collection might represent a developing abscess but does not appear organized and currently measures 35 x 13 x 36 mm.  4. Dependent pulmonary opacity may reflect atelectasis but developing lower lobe infection is difficult to exclude   Plan: Advance diet, pt requesting reg diet Continue emperic ABX therapy due to presumed infection cont IVF's No signs of wound infection on exam, will monitor for now Will recheck labs tomorrow abd tenderness may be due to hematoma, no leak seen on CT scan, simple fluid in the pelvis.  Will monitor closely   LOS: 1 day     .Vanita Panda, MD [facility] [place] [facility]  Surgery, [place] [phone]   05/05/2012 3:29 PM

## 2012-05-05 NOTE — Progress Notes (Signed)
Received verbal orders from [facility] MD to give hydralize 5 mg iv if diastolic BP is greater than 110 as needed Means, Oaklee Sunga N RN  16:45pm

## 2012-05-05 NOTE — Progress Notes (Signed)
Paged Maisie Fus MD regarding patient's elevated BP, will continue to monitor patient, awaiting a call back Means, Reyaan Thoma N Rn 05-05-12 16:16pm

## 2012-05-06 ENCOUNTER — Encounter (INDEPENDENT_AMBULATORY_CARE_PROVIDER_SITE_OTHER): Payer: Medicare Other | Admitting: General Surgery

## 2012-05-06 LAB — BASIC METABOLIC PANEL
CO2: 30 mEq/L (ref 19–32)
Calcium: 10.1 mg/dL (ref 8.4–10.5)
Creatinine, Ser: 0.53 mg/dL (ref 0.50–1.10)
Glucose, Bld: 124 mg/dL — ABNORMAL HIGH (ref 70–99)

## 2012-05-06 LAB — CBC
HCT: 30.7 % — ABNORMAL LOW (ref 36.0–46.0)
Hemoglobin: 10.7 g/dL — ABNORMAL LOW (ref 12.0–15.0)
MCH: 28.8 pg (ref 26.0–34.0)
MCV: 82.5 fL (ref 78.0–100.0)
RBC: 3.72 MIL/uL — ABNORMAL LOW (ref 3.87–5.11)

## 2012-05-06 MED ORDER — LIP MEDEX EX OINT
TOPICAL_OINTMENT | CUTANEOUS | Status: AC
  Administered 2012-05-06: 19:00:00
  Filled 2012-05-06: qty 7

## 2012-05-06 MED ORDER — MORPHINE SULFATE 30 MG PO TABS
90.0000 mg | ORAL_TABLET | ORAL | Status: DC | PRN
  Administered 2012-05-06 – 2012-05-07 (×4): 90 mg via ORAL
  Filled 2012-05-06: qty 1
  Filled 2012-05-06 (×2): qty 3
  Filled 2012-05-06: qty 2
  Filled 2012-05-06: qty 3

## 2012-05-06 MED ORDER — HYDRALAZINE HCL 20 MG/ML IJ SOLN: 10.0000 mg | Freq: Four times a day (QID) | INTRAMUSCULAR | Status: DC | PRN

## 2012-05-06 MED ORDER — LIP MEDEX EX OINT: TOPICAL_OINTMENT | CUTANEOUS | Status: DC | PRN

## 2012-05-06 MED ORDER — VANCOMYCIN HCL IN DEXTROSE 1-5 GM/200ML-% IV SOLN
1000.0000 mg | Freq: Two times a day (BID) | INTRAVENOUS | Status: DC
  Administered 2012-05-06 – 2012-05-07 (×2): 1000 mg via INTRAVENOUS
  Filled 2012-05-06 (×2): qty 200

## 2012-05-06 MED ORDER — MORPHINE SULFATE 4 MG/ML IJ SOLN
4.0000 mg | INTRAMUSCULAR | Status: DC | PRN
  Administered 2012-05-06 – 2012-05-07 (×5): 4 mg via INTRAVENOUS
  Filled 2012-05-06 (×5): qty 1

## 2012-05-06 NOTE — Progress Notes (Signed)
Subjective: Pt readmitted for increased abd pain on Sat that was sudden onset.  She continues to have pain with minimal nausea and low grade fevers.  This is slightly better today. Objective: Vital signs in last 24 hours: Temp:  [98.4 F (36.9 C)-100.6 F (38.1 C)] 99.1 F (37.3 C) (09/24 0557) Pulse Rate:  [96-105] 96  (09/24 0557) Resp:  [18-22] 18  (09/24 0557) BP: (138-155)/(91-106) 148/93 mmHg (09/24 0557) SpO2:  [92 %-100 %] 95 % (09/24 0557)   Intake/Output from previous day: 09/23 0701 - 09/24 0700 In: 480.8 [P.O.:240; I.V.:240.8] Out: 5000 [Urine:5000] Intake/Output this shift:     General appearance: alert, cooperative and mild distress Resp: clear to auscultation bilaterally Cardio: regular rate and rhythm GI: normal findings: no distention and abnormal findings:  obese and mild tenderness to palpation on left side Incision: healing well, no significant drainage, no significant erythema  Lab Results:   [facility] 05/06/12 0434 05/05/12 0425  WBC 10.7* 12.2*  HGB 10.7* 10.0*  HCT 30.7* 28.9*  PLT 307 310   BMET  Basename 05/06/12 0434 05/05/12 0425  NA 138 136  K 3.4* 3.2*  CL 103 103  CO2 30 26  GLUCOSE 124* 102*  BUN <3* 4*  CREATININE 0.53 0.64  CALCIUM 10.1 9.8   MEDS, Scheduled    . cyclobenzaprine  20 mg Oral QHS  . heparin  5,000 Units Subcutaneous Q8H  . lisinopril  20 mg Oral Daily  . metoprolol succinate  100 mg Oral Daily  . morphine  100 mg Oral BID  . pantoprazole (PROTONIX) IV  40 mg Intravenous QHS  . piperacillin-tazobactam (ZOSYN)  IV  3.375 g Intravenous Q8H  . potassium chloride  40 mEq Oral BID  . vancomycin  1,000 mg Intravenous Q8H    Studies/Results: Ct Abdomen Pelvis W Contrast  05/04/2012  *RADIOLOGY REPORT*  Clinical Data: 46 year old female status post ventral hernia repair.  Fever and abdominal pain. Postop day #9.  Laparotomy with small bowel resection for stricture plus incisional hernia.  CT ABDOMEN AND PELVIS  WITH CONTRAST  Technique:  Multidetector CT imaging of the abdomen and pelvis was performed following the standard protocol during bolus administration of intravenous contrast.  Contrast: OMNIPAQUE IOHEXOL 300 MG/ML  SOLN  Comparison: 04/06/2012 and earlier.  Findings: Preoperative study 04/06/2012.  No pericardial or pleural effusion.  There is patchy and confluent dependent pulmonary opacity.  Less pronounced opacity also in the lingula.  This is somewhat advanced for atelectasis, but there are no air bronchograms.  Stable visualized osseous structures.  Right-sided spinal stimulator device partially visible.  Previous postoperative changes to the right lower lumbar spine.  There is a small volume of slightly dense/complex fluid in the pelvis (series 2 image 76, Hounsfield units 23-24).  Uterus appears be chronically surgically absent but both adnexa are felt to remain.  The fluid is located anterior to the rectum which appears within normal limits.  The bladder is fairly unremarkable.  The sigmoid colon is redundant with mild wall thickening.  The distal descending colon is thick-walled, but by the hepatic flexure the colon appears within normal limits.  Ventral abdominal postoperative changes are noted with a small gas fluid collection in the subcutaneous fat measuring 35 x 13 x 36 mm. This tracks beneath the skin incision.  A somewhat denser collection along the caudal aspect of the scan incision more resembles of small postoperative hematoma.  No pneumoperitoneum.  The transverse colon in both flexures are distended mildly  with a mix of fluid and stool.  Oral contrast has reached the cecum and ascending colon.  The appendix and gallbladder are chronically surgically absent.  No definite extravasated contrast, but tracking from the root of the small bowel mesentery through the central mesentery in the left and central abdomen is an amorphous hyperdense collection (50 HU) encompassing 4.8 x 9.1 x 14.3 cm  (AP by transverse by CC).  Some of the surrounding small bowel loops are indistinct/inflamed (series 2 image 44) but not abnormally dilated.  No other convincing abdominal fluid or fluid collection.  Decreased density in the liver.  Spleen, pancreas (fatty atrophy), adrenal glands, stomach and duodenum are within normal limits. Kidneys are stable and within normal limits.  Main portal venous system appears to be patent.  Major arterial structures appear patent.  IMPRESSION: 1.  Appearance most compatible with a moderate sized postoperative hematoma in the small bowel mesentery as detailed above.  Some associated inflammation of adjacent small bowel loops, but no obstruction or extravasated oral contrast (contrast has reached the cecum). 2.  Small volume of more simple appearing fluid (may be evolved blood or seroma fluid) in the pelvis. 3.  Small volume mixed of postoperative blood and air fluid collection in the subcutaneous abdomen underlying the incision site.  The air fluid collection might represent a developing abscess but does not appear organized and currently measures 35 x 13 x 36 mm. 4.  Dependent pulmonary opacity may reflect atelectasis but developing lower lobe infection is difficult to exclude.   Original Report Authenticated By: Harley Hallmark, M.D.    Dg Abd Acute W/chest  05/04/2012  *RADIOLOGY REPORT*  Clinical Data: Recent history of abdominal surgery on the 04/25/2012 for hernia and bowel repair.  Abdominal pain for the past 3 days.  Fever and nausea.  ACUTE ABDOMEN SERIES (ABDOMEN 2 VIEW & CHEST 1 VIEW)  Comparison: 04/06/2012.  Findings: Lung volumes are normal.  Slight prominence of the interstitial markings throughout the lung bases may reflect some resolving mild postoperative edema and/or sequelae of mild aspiration.  The remainder the lungs appear well aerated, without acute consolidative airspace disease or pleural effusions.  There is only mild cephalization of the pulmonary  vasculature at this time, without evidence of edema in the mid and upper lungs.  The heart size is normal.  Mediastinal contours are unremarkable. Wires extend up into the cervical region, likely from spinal cord stimulator.  No pneumoperitoneum.  Supine and upright views of the abdomen demonstrate gas and stool scattered throughout the colon extending to the distal rectum. There is one mildly dilated loop of small bowel seen projecting over the left side of the central abdomen, with some small air fluid levels scattered.  Lower midline abdominal skin staples are noted.  Surgical clips project over the right upper quadrant of the abdomen, consistent with history of prior cholecystectomy.  There is an electronic device in place projecting over the right lower flank posteriorly.  IMPRESSION: 1.  Nonspecific bowel gas pattern, as above.  Findings do not strongly favor a bowel obstruction, and are most favored to represent a very mild postoperative ileus. 2.  Appearance of the lung bases may suggest some resolving areas of postoperative edema, atelectasis and/or sequelae of mild aspiration. 3.  Pulmonary vascular congestion.   Original Report Authenticated By: Florencia Reasons, M.D.     Assessment: s/p  Patient Active Problem List  Diagnosis  . HYPERLIPIDEMIA  . METABOLIC SYNDROME X  . OBESITY  .  ANEMIA-NOS  . TOBACCO ABUSE  . DEPRESSION  . REFLEX SYMPATHETIC DYSTROPHY  . HYPERTENSION  . ALLERGIC RHINITIS  . GERD  . GASTROPARESIS  . HERNIATED CERVICAL DISC  . INSOMNIA  . FATIGUE  . PARESTHESIA  . MIGRAINES, HX OF  . Diabetes mellitus type 2, uncontrolled  . Nausea  . Primary hyperparathyroidism  . Hypertension  . Preop cardiovascular exam  . Abdominal pain  . Open abdominal wall wound  . Fatigue  . Hypoparathyroidism  . Constipation  . IBS (irritable bowel syndrome)   I believe she had an acute mesenteric bleed, possibly related to her hypertension.  This appears to be stable with  her hgb trending up.  Plan: Cont reg diet Continue emperic ABX therapy due to presumed infection.  Will de-escalate therapy tomorrow D/c IVF's No signs of wound infection on exam, will monitor for now abd tenderness may be due to hematoma, no leak seen on CT scan, simple fluid in the pelvis.  Will monitor closely   LOS: 2 days     .Vanita Panda, MD [facility] Surgery, [place] [phone]   05/06/2012 8:49 AM

## 2012-05-06 NOTE — Progress Notes (Signed)
ANTIBIOTIC CONSULT NOTE - FOLLOW UP  Pharmacy Consult for Vancomycin Indication: post-op Hematome/rule out infection  Allergies  Allergen Reactions  . Gadolinium Derivatives     Pt states nausea and vomiting to GAD in MRI pt states she does ok with iv/cm in cat scan  . Aspirin     REACTION: Tongue, throat, extremities swell  . Dilaudid (Hydromorphone Hcl) Nausea And Vomiting  . Dye Fdc Red (Red Dye)   . Ibuprofen     REACTION: Tongue, throat, extremities swell  . Latex Hives  . Nsaids     REACTION: Tongue, throat, extremities swell   Patient Measurements: Height: 5\' 7"  (170.2 cm) Weight: 200 lb (90.719 kg) IBW/kg (Calculated) : 61.6   Vital Signs: Temp: 99.1 F (37.3 C) (09/24 0557) Temp src: Oral (09/24 0557) BP: 148/93 mmHg (09/24 0557) Pulse Rate: 96  (09/24 0557) Intake/Output from previous day: 09/23 0701 - 09/24 0700 In: 480.8 [P.O.:240; I.V.:240.8] Out: 5000 [Urine:5000]   Labs:  [facility] 05/06/12 0434 05/05/12 0425 05/04/12 1520  WBC 10.7* 12.2* 16.8*  HGB 10.7* 10.0* 11.4*  PLT 307 310 412*  LABCREA -- -- --  CREATININE 0.53 0.64 0.56   Estimated Creatinine Clearance: 103.7 ml/min (by C-G formula based on Cr of 0.53).  Microbiology: Recent Results (from the past 720 hour(s))  SURGICAL PCR SCREEN     Status: Normal   Collection Time   04/24/12  2:02 PM      Component Value Range Status Comment   MRSA, PCR NEGATIVE  NEGATIVE Final    Staphylococcus aureus NEGATIVE  NEGATIVE Final   CULTURE, BLOOD (ROUTINE X 2)     Status: Normal (Preliminary result)   Collection Time   05/04/12  3:50 PM      Component Value Range Status Comment   Specimen Description BLOOD LEFT ANTECUBITAL   Final    Special Requests BOTTLES DRAWN AEROBIC AND ANAEROBIC 3CC   Final    Culture  Setup Time 05/04/2012 20:13   Final    Culture     Final    Value:        BLOOD CULTURE RECEIVED NO GROWTH TO DATE CULTURE WILL BE HELD FOR 5 DAYS BEFORE ISSUING A FINAL NEGATIVE REPORT   Report Status PENDING   Incomplete   CULTURE, BLOOD (ROUTINE X 2)     Status: Normal (Preliminary result)   Collection Time   05/04/12  4:07 PM      Component Value Range Status Comment   Specimen Description BLOOD LEFT ANTECUBITAL   Final    Special Requests BOTTLES DRAWN AEROBIC AND ANAEROBIC 4CC   Final    Culture  Setup Time 05/04/2012 20:13   Final    Culture     Final    Value:        BLOOD CULTURE RECEIVED NO GROWTH TO DATE CULTURE WILL BE HELD FOR 5 DAYS BEFORE ISSUING A FINAL NEGATIVE REPORT   Report Status PENDING   Incomplete     Anti-infectives     Start     Dose/Rate Route Frequency Ordered Stop   05/06/12 2000   vancomycin (VANCOCIN) IVPB 1000 mg/200 mL premix        1,000 mg 200 mL/hr over 60 Minutes Intravenous Every 12 hours 05/06/12 0912     05/05/12 0000   vancomycin (VANCOCIN) IVPB 1000 mg/200 mL premix  Status:  Discontinued        1,000 mg 200 mL/hr over 60 Minutes Intravenous Every 8 hours 05/04/12  2132 05/06/12 0912   05/04/12 2200   piperacillin-tazobactam (ZOSYN) IVPB 3.375 g  Status:  Discontinued        3.375 g 12.5 mL/hr over 240 Minutes Intravenous 3 times per day 05/04/12 2006 05/04/12 2013   05/04/12 2100  piperacillin-tazobactam (ZOSYN) IVPB 3.375 g       3.375 g 12.5 mL/hr over 240 Minutes Intravenous Every 8 hours 05/04/12 2015     05/04/12 1700   vancomycin (VANCOCIN) IVPB 1000 mg/200 mL premix        1,000 mg 200 mL/hr over 60 Minutes Intravenous  Once 05/04/12 1648 05/04/12 1931   05/04/12 1630  piperacillin-tazobactam (ZOSYN) IVPB 3.375 g       3.375 g 100 mL/hr over 30 Minutes Intravenous  Once 05/04/12 1624 05/04/12 1816          Assessment:  46 yof s/p SB resection/hernia repair 9/13. Re-admit 9/22 with fever, leukocytosis. Abd hematoma on CT, rule out abscess.  Day 3 Vancomycin/Zosyn q8h; temps, leukocytosis resolving. Plan to de-escalate antibiotics tomorrow, no Vanc trough necessary.  Blood cultures no growth, renal  function unchanged.  Goal of Therapy:  Vancomycin trough level 15-20 mcg/ml  Plan:  Continue Vancomycin 1gm, but reduce schedule to q12. No change Zosyn dose/schedule.  Otho Bellows PharmD Pager [phone] 05/06/2012,9:13 AM

## 2012-05-06 NOTE — Progress Notes (Signed)
Pt requested to ambulate off unit. She was informed that could not happen unless orders and she was intrusted and directed on how far she is allowed to go. Pt then stated she understood. An IV team nurse came looking for the Pt to replace her IV. Pt was not seen in her room or bathroom. Staff looked on the unit and other units on the floor to see if the Pt was there or seen by anyone. The back staircase was also searched and the secretary says she did not see the patient walk by her. Security was notified, the Pt's husband was contacted. He called the Pt on her cell phone and told her we were looking for her and she needed to come to the unit. The A/C was also notified of the situation and what has been done. The patent walk to the unit shortly after the nurse spoke to Pt's husband. She did appear to have an odor of cigarette smoke on her. She denied leaving the floor and said she was sitting in the waiting area between the two units. She said she became SOB and had to sit to catch her breath. The A/C went into the Pt's room and spoke to her about the rules. Pt stated she is aware of the rules, but not happy about it.

## 2012-05-07 ENCOUNTER — Other Ambulatory Visit (INDEPENDENT_AMBULATORY_CARE_PROVIDER_SITE_OTHER): Payer: Self-pay | Admitting: General Surgery

## 2012-05-07 DIAGNOSIS — R109 Unspecified abdominal pain: Secondary | ICD-10-CM

## 2012-05-07 MED ORDER — DIPHENHYDRAMINE HCL 25 MG PO CAPS
25.0000 mg | ORAL_CAPSULE | Freq: Four times a day (QID) | ORAL | Status: DC | PRN
  Administered 2012-05-07: 25 mg via ORAL
  Filled 2012-05-07: qty 1

## 2012-05-07 MED ORDER — AMOXICILLIN-POT CLAVULANATE 875-125 MG PO TABS
1.0000 | ORAL_TABLET | Freq: Two times a day (BID) | ORAL | Status: DC
  Administered 2012-05-07: 1 via ORAL
  Filled 2012-05-07 (×2): qty 1

## 2012-05-07 MED ORDER — FLUCONAZOLE 200 MG PO TABS: 200.0000 mg | ORAL_TABLET | Freq: Every day | ORAL | Status: DC

## 2012-05-07 MED ORDER — AMOXICILLIN-POT CLAVULANATE 875-125 MG PO TABS: 1.0000 | ORAL_TABLET | Freq: Two times a day (BID) | ORAL | Status: DC

## 2012-05-07 MED ORDER — LORAZEPAM 0.5 MG PO TABS
0.5000 mg | ORAL_TABLET | Freq: Three times a day (TID) | ORAL | Status: DC
  Administered 2012-05-07: 0.5 mg via ORAL
  Filled 2012-05-07: qty 1

## 2012-05-07 MED ORDER — DIPHENHYDRAMINE HCL 50 MG/ML IJ SOLN
25.0000 mg | Freq: Four times a day (QID) | INTRAMUSCULAR | Status: DC | PRN
  Administered 2012-05-07: 50 mg via INTRAVENOUS
  Filled 2012-05-07: qty 1

## 2012-05-07 NOTE — Progress Notes (Signed)
Pt c/o itching all over requested diphenhydrAMINE (BENADRYL). Order obtained, but Pt has Dye Fdc Red (Red Dye) listed as an allergy. Pt when informed of the component of Dye Fdc Red (Red Dye) in  diphenhydrAMINE (BENADRYL), stated "I am not allergic, that is wrong information, the allergy is from IV contrast that was given to me. I take  BENADRYL all the time without any problems. The ice cream I ate last night had red dye in it".

## 2012-05-07 NOTE — Care Management Note (Signed)
    Page 1 of 1   05/07/2012     3:53:56 PM   CARE MANAGEMENT NOTE 05/07/2012  Patient:  Emma Munoz, Emma Munoz   Account Number:  [id number]  Date Initiated:  05/07/2012  Documentation initiated by:  Lorenda Ishihara  Subjective/Objective Assessment:     Action/Plan:   Anticipated DC Date:  05/07/2012   Anticipated DC Plan:           Choice offered to / List presented to:             Status of service:  Completed, signed off Medicare Important Message given?   (If response is "NO", the following Medicare IM given date fields will be blank) Date Medicare IM given:   Date Additional Medicare IM given:    Discharge Disposition:  AGAINST MEDICAL ADVICE  Per UR Regulation:  Reviewed for med. necessity/level of care/duration of stay  If discussed at Long Length of Stay Meetings, dates discussed:    Comments:

## 2012-05-07 NOTE — Progress Notes (Signed)
Patient requested to leave AMA stated she did not feel like being baby sat any longer and has been in and out of the hospital too much and does not want to continue to be here. Contacted Dr Maisie Fus regarding patient wanting to leave and Dr Maisie Fus stated to let patient leave and Dr Maisie Fus would meet with patient later. AMA paper is signed. Patient left at 1515. Safety zone portal done. Aarohi Redditt RN
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
## 2012-05-07 NOTE — Progress Notes (Signed)
Pt continues to c/o itching, along with the presence of hives, feeling puffy in face and H/A. No bumps or rashes noted on there skin ir body when assessed. The 25mg  of benadryl was not affective according to the pt. On call MD called, informed and orders obtained.

## 2012-05-10 LAB — CULTURE, BLOOD (ROUTINE X 2): Culture: NO GROWTH

## 2012-05-12 ENCOUNTER — Telehealth (INDEPENDENT_AMBULATORY_CARE_PROVIDER_SITE_OTHER): Payer: Self-pay

## 2012-05-12 NOTE — Discharge Summary (Signed)
Patient ID: Emma Munoz [id number] 46 y.o. 1966/06/27  05/04/2012  Discharge date and time: 05/07/2012  3:12 PM  Admitting Physician: Vanita Panda  Discharge Physician: Vanita Panda.  Admission Diagnosis: post op fever, abd pain  Discharge Diagnoses: mesenteric bleeding  Operations: none     Discharged Condition: good    Hospital Course: the patient was admitted for fevers and abd pain.  She was placed on antibiotics and a ct was performed.  This showed a blood clot within her mesentery.  She slowly improved and was able to tolerate a diet.  There was a disagreement between her and the nursing staff and she left ama.

## 2012-05-12 NOTE — Telephone Encounter (Signed)
The pt called in and states she had fever yesterday of 102.7.  She took Tylenol and today it is 100.7.  I offered to bring her in to see Dr Maisie Fus in the morning.  She has no other symptoms such as red incision or increased pain.  I confirmed with Dr Abbey Chatters since Dr Maisie Fus is off that I didn't need to order anything and he said it was ok that I am bringing her in.

## 2012-05-13 ENCOUNTER — Encounter (INDEPENDENT_AMBULATORY_CARE_PROVIDER_SITE_OTHER): Payer: Self-pay | Admitting: General Surgery

## 2012-05-13 ENCOUNTER — Ambulatory Visit (INDEPENDENT_AMBULATORY_CARE_PROVIDER_SITE_OTHER): Payer: Medicare Other | Admitting: General Surgery

## 2012-05-13 ENCOUNTER — Observation Stay (HOSPITAL_COMMUNITY): Payer: Medicare Other

## 2012-05-13 ENCOUNTER — Encounter (HOSPITAL_COMMUNITY): Payer: Self-pay | Admitting: General Practice

## 2012-05-13 ENCOUNTER — Inpatient Hospital Stay (HOSPITAL_COMMUNITY)
Admission: AD | Admit: 2012-05-13 | Discharge: 2012-05-20 | DRG: 919 | Disposition: A | Discharge status: Home Health | Payer: Medicare Other | Source: Ambulatory Visit | Attending: General Surgery | Admitting: General Surgery

## 2012-05-13 VITALS — BP 130/82 | HR 96 | Temp 97.6°F | Resp 24

## 2012-05-13 DIAGNOSIS — K589 Irritable bowel syndrome without diarrhea: Secondary | ICD-10-CM | POA: Diagnosis present

## 2012-05-13 DIAGNOSIS — Z8042 Family history of malignant neoplasm of prostate: Secondary | ICD-10-CM

## 2012-05-13 DIAGNOSIS — F17200 Nicotine dependence, unspecified, uncomplicated: Secondary | ICD-10-CM | POA: Diagnosis present

## 2012-05-13 DIAGNOSIS — Z888 Allergy status to other drugs, medicaments and biological substances status: Secondary | ICD-10-CM

## 2012-05-13 DIAGNOSIS — I10 Essential (primary) hypertension: Secondary | ICD-10-CM | POA: Diagnosis present

## 2012-05-13 DIAGNOSIS — IMO0002 Reserved for concepts with insufficient information to code with codable children: Secondary | ICD-10-CM | POA: Diagnosis present

## 2012-05-13 DIAGNOSIS — Z23 Encounter for immunization: Secondary | ICD-10-CM

## 2012-05-13 DIAGNOSIS — R5082 Postprocedural fever: Secondary | ICD-10-CM

## 2012-05-13 DIAGNOSIS — Z886 Allergy status to analgesic agent status: Secondary | ICD-10-CM

## 2012-05-13 DIAGNOSIS — E119 Type 2 diabetes mellitus without complications: Secondary | ICD-10-CM | POA: Diagnosis present

## 2012-05-13 DIAGNOSIS — Z841 Family history of disorders of kidney and ureter: Secondary | ICD-10-CM

## 2012-05-13 DIAGNOSIS — K651 Peritoneal abscess: Secondary | ICD-10-CM | POA: Diagnosis present

## 2012-05-13 DIAGNOSIS — Z79899 Other long term (current) drug therapy: Secondary | ICD-10-CM

## 2012-05-13 DIAGNOSIS — Z8249 Family history of ischemic heart disease and other diseases of the circulatory system: Secondary | ICD-10-CM

## 2012-05-13 DIAGNOSIS — Y92009 Unspecified place in unspecified non-institutional (private) residence as the place of occurrence of the external cause: Secondary | ICD-10-CM

## 2012-05-13 DIAGNOSIS — Y836 Removal of other organ (partial) (total) as the cause of abnormal reaction of the patient, or of later complication, without mention of misadventure at the time of the procedure: Secondary | ICD-10-CM | POA: Diagnosis present

## 2012-05-13 DIAGNOSIS — B961 Klebsiella pneumoniae [K. pneumoniae] as the cause of diseases classified elsewhere: Secondary | ICD-10-CM | POA: Diagnosis present

## 2012-05-13 LAB — CBC WITH DIFFERENTIAL/PLATELET
Basophils Absolute: 0 10*3/uL (ref 0.0–0.1)
HCT: 33.8 % — ABNORMAL LOW (ref 36.0–46.0)
Lymphocytes Relative: 17 % (ref 12–46)
Neutro Abs: 8.3 10*3/uL — ABNORMAL HIGH (ref 1.7–7.7)
Platelets: 446 10*3/uL — ABNORMAL HIGH (ref 150–400)
RDW: 13.9 % (ref 11.5–15.5)
WBC: 11.1 10*3/uL — ABNORMAL HIGH (ref 4.0–10.5)

## 2012-05-13 LAB — COMPREHENSIVE METABOLIC PANEL
ALT: 8 U/L (ref 0–35)
AST: 10 U/L (ref 0–37)
CO2: 27 mEq/L (ref 19–32)
Chloride: 100 mEq/L (ref 96–112)
GFR calc non Af Amer: >90 mL/min (ref >90)
Sodium: 137 mEq/L (ref 135–145)
Total Bilirubin: 0.5 mg/dL (ref 0.3–1.2)

## 2012-05-13 LAB — URINALYSIS, ROUTINE W REFLEX MICROSCOPIC
Glucose, UA: NEGATIVE mg/dL
Hgb urine dipstick: NEGATIVE
Ketones, ur: NEGATIVE mg/dL
Protein, ur: 100 mg/dL — AB

## 2012-05-13 LAB — URINE MICROSCOPIC-ADD ON

## 2012-05-13 MED ORDER — HYDROMORPHONE HCL PF 1 MG/ML IJ SOLN: 1.0000 mg | INTRAMUSCULAR | Status: DC | PRN

## 2012-05-13 MED ORDER — LINACLOTIDE 290 MCG PO CAPS
290.0000 ug | ORAL_CAPSULE | Freq: Every day | ORAL | Status: DC
  Administered 2012-05-14: 290 ug via ORAL
  Filled 2012-05-13 (×5): qty 1

## 2012-05-13 MED ORDER — PANTOPRAZOLE SODIUM 40 MG PO TBEC
40.0000 mg | DELAYED_RELEASE_TABLET | Freq: Every day | ORAL | Status: DC
  Administered 2012-05-14 – 2012-05-20 (×7): 40 mg via ORAL
  Filled 2012-05-13 (×7): qty 1

## 2012-05-13 MED ORDER — MORPHINE SULFATE ER 30 MG PO TBCR
60.0000 mg | EXTENDED_RELEASE_TABLET | Freq: Two times a day (BID) | ORAL | Status: DC
  Administered 2012-05-13: 60 mg via ORAL
  Filled 2012-05-13: qty 2

## 2012-05-13 MED ORDER — MORPHINE SULFATE ER 100 MG PO TBCR
100.0000 mg | EXTENDED_RELEASE_TABLET | Freq: Two times a day (BID) | ORAL | Status: DC
  Administered 2012-05-13 – 2012-05-20 (×13): 100 mg via ORAL
  Filled 2012-05-13 (×14): qty 1

## 2012-05-13 MED ORDER — INFLUENZA VIRUS VACC SPLIT PF IM SUSP
0.5000 mL | INTRAMUSCULAR | Status: AC
  Administered 2012-05-14: 0.5 mL via INTRAMUSCULAR
  Filled 2012-05-13: qty 0.5

## 2012-05-13 MED ORDER — ENOXAPARIN SODIUM 40 MG/0.4ML ~~LOC~~ SOLN
40.0000 mg | SUBCUTANEOUS | Status: DC
  Administered 2012-05-13 – 2012-05-19 (×7): 40 mg via SUBCUTANEOUS
  Filled 2012-05-13 (×8): qty 0.4

## 2012-05-13 MED ORDER — LISINOPRIL 20 MG PO TABS
20.0000 mg | ORAL_TABLET | Freq: Every day | ORAL | Status: DC
  Administered 2012-05-13 – 2012-05-20 (×8): 20 mg via ORAL
  Filled 2012-05-13 (×9): qty 1

## 2012-05-13 MED ORDER — ONDANSETRON HCL 4 MG/2ML IJ SOLN: 4.0000 mg | Freq: Four times a day (QID) | INTRAMUSCULAR | Status: DC | PRN

## 2012-05-13 MED ORDER — CHLORHEXIDINE GLUCONATE 0.12 % MT SOLN
15.0000 mL | Freq: Two times a day (BID) | OROMUCOSAL | Status: DC
  Administered 2012-05-13 – 2012-05-15 (×5): 15 mL via OROMUCOSAL
  Filled 2012-05-13 (×4): qty 15

## 2012-05-13 MED ORDER — PIPERACILLIN-TAZOBACTAM 3.375 G IVPB
3.3750 g | Freq: Three times a day (TID) | INTRAVENOUS | Status: DC
  Administered 2012-05-13 – 2012-05-15 (×6): 3.375 g via INTRAVENOUS
  Filled 2012-05-13 (×8): qty 50

## 2012-05-13 MED ORDER — LISINOPRIL 20 MG PO TABS: 20.0000 mg | ORAL_TABLET | Freq: Every day | ORAL | Status: DC

## 2012-05-13 MED ORDER — IOHEXOL 300 MG/ML  SOLN
20.0000 mL | INTRAMUSCULAR | Status: AC
  Administered 2012-05-13 (×2): 20 mL via ORAL

## 2012-05-13 MED ORDER — KCL IN DEXTROSE-NACL 20-5-0.45 MEQ/L-%-% IV SOLN
INTRAVENOUS | Status: DC
  Administered 2012-05-13 – 2012-05-15 (×3): via INTRAVENOUS
  Administered 2012-05-16: 20 mL/h via INTRAVENOUS
  Filled 2012-05-13 (×11): qty 1000

## 2012-05-13 MED ORDER — METOPROLOL SUCCINATE ER 100 MG PO TB24
100.0000 mg | ORAL_TABLET | Freq: Every day | ORAL | Status: DC
  Administered 2012-05-13 – 2012-05-20 (×8): 100 mg via ORAL
  Filled 2012-05-13 (×9): qty 1

## 2012-05-13 MED ORDER — MORPHINE SULFATE 15 MG PO TABS
90.0000 mg | ORAL_TABLET | ORAL | Status: DC | PRN
  Administered 2012-05-13 – 2012-05-20 (×7): 90 mg via ORAL
  Filled 2012-05-13 (×6): qty 6
  Filled 2012-05-13: qty 4
  Filled 2012-05-13 (×2): qty 6

## 2012-05-13 MED ORDER — METOPROLOL SUCCINATE ER 100 MG PO TB24: 100.0000 mg | ORAL_TABLET | Freq: Every day | ORAL | Status: DC

## 2012-05-13 MED ORDER — CYCLOBENZAPRINE HCL 10 MG PO TABS
20.0000 mg | ORAL_TABLET | Freq: Every day | ORAL | Status: DC
  Administered 2012-05-13 – 2012-05-19 (×7): 20 mg via ORAL
  Filled 2012-05-13 (×12): qty 2

## 2012-05-13 MED ORDER — BIOTENE DRY MOUTH MT LIQD
15.0000 mL | Freq: Two times a day (BID) | OROMUCOSAL | Status: DC
  Administered 2012-05-14 – 2012-05-15 (×4): 15 mL via OROMUCOSAL

## 2012-05-13 MED ORDER — MORPHINE SULFATE ER 100 MG PO CP24: 100.0000 mg | ORAL_CAPSULE | Freq: Two times a day (BID) | ORAL | Status: DC

## 2012-05-13 MED ORDER — IOHEXOL 300 MG/ML  SOLN
100.0000 mL | Freq: Once | INTRAMUSCULAR | Status: AC | PRN
  Administered 2012-05-13: 100 mL via INTRAVENOUS

## 2012-05-13 MED ORDER — MORPHINE SULFATE 2 MG/ML IJ SOLN
2.0000 mg | INTRAMUSCULAR | Status: DC | PRN
  Administered 2012-05-13 – 2012-05-14 (×4): 2 mg via INTRAVENOUS
  Filled 2012-05-13 (×2): qty 1
  Filled 2012-05-13: qty 3
  Filled 2012-05-13 (×2): qty 1

## 2012-05-13 NOTE — Progress Notes (Signed)
Emma Munoz is a 46 y.o. female who is status post a SBR on 9/13.  She has had a complicated post op course.  She was admitted last week for abd pain and was found to have a mesenteric hematoma with acute drop in Hgb.  She also had a increased wbc on presentation that remitted with abx.  SHe was continued on a course of Augmentin at home.  She reports fevers to 103.  Mild nausea, no vomiting.  She has abd pain in her central abdomen.  Her bowel movements are becoming regular and her diarrhea is resolving.  Objective: Filed Vitals:   05/13/12 1133  BP: 130/82  Pulse: 96  Temp: 97.6 F (36.4 C)  Resp: 24    General appearance: cooperative, fatigued and mild distress Resp: clear to auscultation bilaterally Cardio: regular rate and rhythm GI: soft, tender to palpation within superior aspect of incision  Incision: healing well   Assessment: s/p  Patient Active Problem List  Diagnosis  . HYPERLIPIDEMIA  . METABOLIC SYNDROME X  . OBESITY  . ANEMIA-NOS  . TOBACCO ABUSE  . DEPRESSION  . REFLEX SYMPATHETIC DYSTROPHY  . HYPERTENSION  . ALLERGIC RHINITIS  . GERD  . GASTROPARESIS  . HERNIATED CERVICAL DISC  . INSOMNIA  . FATIGUE  . PARESTHESIA  . MIGRAINES, HX OF  . Diabetes mellitus type 2, uncontrolled  . Nausea  . Primary hyperparathyroidism  . Hypertension  . Preop cardiovascular exam  . Abdominal pain  . Open abdominal wall wound  . Fatigue  . Hypoparathyroidism  . Constipation  . IBS (irritable bowel syndrome)    Due to tenderness and fevers, I suspected a wound infection.  I did a [place] in the office which showed a fluid collection underneath the superior aspect of her incision.  After verbal consent.  I infused her skin around the incision with lidocaine and incised her previous scar.  The tissue underneath looked healthy.  I palpated the mass and incised this with a scalpel.  There was no fluid or purulence inside the cavity.  The cavity was packed with gauze and  covered with a sterile dressing.  Plan: I am not sure what is causing the patients fevers.  I will admit to the hospital for fever workup.  I have ordered a CT scan, as well as a complete set of labs and blood cultures.  I will place her on IV Zosyn for now until a sourse can be found.  We will get a UA and CXR to eval for any extra-abd source of fevers.     Vanita Panda, MD [facility], [place] [phone]   05/13/2012 12:02 PM

## 2012-05-13 NOTE — H&P (Signed)
Emma Munoz is an 46 y.o. female.   Chief Complaint: fevers HPI: Emma Munoz is a 47 y.o. female who is status post a SBR on 9/13. She has had a complicated post op course. She was admitted last week for abd pain and was found to have a mesenteric hematoma with acute drop in Hgb. She also had a increased wbc on presentation that remitted with abx. SHe was continued on a course of Augmentin at home. She reports fevers to 103. Mild nausea, no vomiting. She has abd pain in her central abdomen. Her bowel movements are becoming regular and her diarrhea is resolving.   Past Medical History  Diagnosis Date  . Hypertension   . IBS (irritable bowel syndrome)   . Constipation   . Hyperthyroidism   . Heart murmur   . Hyperglycemia   . Tobacco abuse   . Type II diabetes mellitus     for 6 weeks only 09/2011-11/2011    Past Surgical History  Procedure Date  . Cesarean section 85, 87, 95  . Lumbar disc surgery 1993; 2000  . Ulnar nerve repair 1998    left "got my hand crushed"  . Myomectomy 1982-2005    "multiple; mostly w/scopes" (05/13/2012)  . Bowel resection 04/25/2012    Procedure: SMALL BOWEL RESECTION;  Surgeon: Romie Levee, MD;  Location: [facility] OR;  Service: General;  Laterality: N/A;  small bowell resection, incisional hernia repair  . Incisional hernia repair 04/25/2012    Procedure: HERNIA REPAIR INCISIONAL;  Surgeon: Romie Levee, MD;  Location: [facility] OR;  Service: General;  Laterality: N/A;  . Colon surgery 05/13/2012    small bowel resection w/IHR  . Cholecystectomy 11/2011    With complications  . Vaginal hysterectomy 2005  . Tubal ligation 1995  . Collateral ligament repair, knee 1987; 1990's    left  . Implantation vagal nerve stimulator 07/2011    "lower back" (05/13/2012)  . Shoulder arthroscopy w/ rotator cuff repair ~ 2005    left  . Hernia repair 04/2012; 05/13/2012    incisional  . Back surgery     Family History  Problem Relation Age of Onset  . Early death  Father 45    GUNSHOT  . Kidney disease Brother     s/p transplant  . Heart disease Maternal Grandmother     Heart defect  . Prostate cancer Maternal Grandfather   . Cancer Maternal Grandfather     prostate   Social History:  reports that she has been smoking Cigarettes.  She has a 24 pack-year smoking history. She has never used smokeless tobacco. She reports that she does not drink alcohol or use illicit drugs.  Allergies:  Allergies  Allergen Reactions  . Aspirin Anaphylaxis    REACTION: Tongue, throat, extremities swell  . Dilaudid (Hydromorphone Hcl) Nausea And Vomiting  . Ibuprofen Anaphylaxis    REACTION: Tongue, throat, extremities swell  . Latex Hives  . Nsaids Anaphylaxis    REACTION: Tongue, throat, extremities swell  . Gadolinium Derivatives Nausea And Vomiting and Other (See Comments)    Pt states nausea and vomiting to GAD in MRI pt states she does ok with iv/cm in cat scan    Medications Prior to Admission  Medication Sig Dispense Refill  . cyclobenzaprine (FLEXERIL) 10 MG tablet Take 20 mg by mouth at bedtime.      Marland Kitchen esomeprazole (NEXIUM) 40 MG capsule Take 40 mg by mouth daily.      . fluconazole (DIFLUCAN) 200  MG tablet Take 200 mg by mouth daily.      . Linaclotide (LINZESS) 290 MCG CAPS Take 290 mcg by mouth daily.       Marland Kitchen lisinopril (PRINIVIL,ZESTRIL) 20 MG tablet Take 20 mg by mouth daily.      . metoprolol succinate (TOPROL-XL) 100 MG 24 hr tablet Take 100 mg by mouth daily. Take with or immediately following a meal.      . morphine (KADIAN) 100 MG 24 hr capsule Take 100 mg by mouth 2 (two) times daily.       Marland Kitchen morphine (MSIR) 30 MG tablet Take 3 tablets (90 mg total) by mouth every 4 (four) hours as needed. For breakthrough pain  120 tablet  0  . DISCONTD: fluconazole (DIFLUCAN) 200 MG tablet Take 1 tablet (200 mg total) by mouth daily.  3 tablet  2    No results found for this or any previous visit (from the past 48 hour(s)). No results  found.  Review of Systems  Constitutional: Positive for fever, chills and diaphoresis.  HENT: Negative for congestion and sore throat.   Eyes: Negative for blurred vision.  Respiratory: Positive for cough and sputum production. Negative for hemoptysis, shortness of breath and wheezing.   Cardiovascular: Negative for chest pain, palpitations and leg swelling.  Gastrointestinal: Positive for nausea and abdominal pain. Negative for vomiting, diarrhea, constipation and blood in stool.  Genitourinary: Negative for dysuria, urgency and frequency.  Musculoskeletal: Positive for myalgias and joint pain.  Skin: Negative for rash.  Neurological: Positive for dizziness and weakness. Negative for sensory change, focal weakness, loss of consciousness and headaches.  Endo/Heme/Allergies: Does not bruise/bleed easily.    Blood pressure 154/106, pulse 89, temperature 98.4 F (36.9 C), temperature source Oral, resp. rate 20, height 5\' 7"  (1.702 m), weight 199 lb 15.7 oz (90.71 kg). Physical Exam  Constitutional: She is oriented to person, place, and time. She appears well-developed and well-nourished.  HENT:  Head: Normocephalic and atraumatic.  Eyes: Pupils are equal, round, and reactive to light. No scleral icterus.  Neck: Normal range of motion.  Cardiovascular: Normal rate and regular rhythm.   Respiratory: Breath sounds normal. No respiratory distress.  GI: Soft. She exhibits no distension. There is tenderness. There is no rebound and no guarding.  Musculoskeletal: Normal range of motion.  Neurological: She is alert and oriented to person, place, and time.  Skin: Skin is warm. She is diaphoretic.     Assessment/Plan I am not sure what is causing the patient's fevers. I will admit to the hospital for fever workup. I have ordered a CT scan, as well as a complete set of labs and blood cultures. I will place her on IV Zosyn for now until a sourse can be found. We will get a UA and CXR to eval for  any extra-abd source of fevers.    Makayle Krahn C. 05/13/2012, 4:18 PM

## 2012-05-13 NOTE — Patient Instructions (Signed)
You will be admitted to [facility]

## 2012-05-14 ENCOUNTER — Encounter (HOSPITAL_COMMUNITY): Payer: Self-pay | Admitting: Radiology

## 2012-05-14 ENCOUNTER — Observation Stay (HOSPITAL_COMMUNITY): Payer: Medicare Other

## 2012-05-14 LAB — CBC
HCT: 30.7 % — ABNORMAL LOW (ref 36.0–46.0)
MCH: 27.6 pg (ref 26.0–34.0)
MCV: 81.4 fL (ref 78.0–100.0)
Platelets: 403 10*3/uL — ABNORMAL HIGH (ref 150–400)
RBC: 3.77 MIL/uL — ABNORMAL LOW (ref 3.87–5.11)

## 2012-05-14 LAB — BASIC METABOLIC PANEL
CO2: 25 mEq/L (ref 19–32)
Calcium: 10.8 mg/dL — ABNORMAL HIGH (ref 8.4–10.5)
Chloride: 101 mEq/L (ref 96–112)
Glucose, Bld: 127 mg/dL — ABNORMAL HIGH (ref 70–99)
Sodium: 135 mEq/L (ref 135–145)

## 2012-05-14 LAB — APTT: aPTT: 39 seconds — ABNORMAL HIGH (ref 24–37)

## 2012-05-14 LAB — PROTIME-INR: Prothrombin Time: 14.3 seconds (ref 11.6–15.2)

## 2012-05-14 MED ORDER — MORPHINE SULFATE 4 MG/ML IJ SOLN
6.0000 mg | Freq: Once | INTRAMUSCULAR | Status: AC
  Administered 2012-05-14: 4 mg via INTRAVENOUS
  Filled 2012-05-14: qty 1

## 2012-05-14 MED ORDER — HYDRALAZINE HCL 20 MG/ML IJ SOLN
10.0000 mg | INTRAMUSCULAR | Status: DC | PRN
  Administered 2012-05-14: 10 mg via INTRAVENOUS
  Filled 2012-05-14 (×3): qty 0.5

## 2012-05-14 MED ORDER — FENTANYL CITRATE 0.05 MG/ML IJ SOLN
INTRAMUSCULAR | Status: AC | PRN
  Administered 2012-05-14 (×4): 50 ug via INTRAVENOUS

## 2012-05-14 MED ORDER — MIDAZOLAM HCL 5 MG/5ML IJ SOLN
INTRAMUSCULAR | Status: AC | PRN
  Administered 2012-05-14: 1 mg via INTRAVENOUS
  Administered 2012-05-14: 2 mg via INTRAVENOUS

## 2012-05-14 MED ORDER — HYDRALAZINE HCL 20 MG/ML IJ SOLN
10.0000 mg | Freq: Once | INTRAMUSCULAR | Status: DC
  Filled 2012-05-14: qty 0.5

## 2012-05-14 MED ORDER — ACETAMINOPHEN 325 MG PO TABS
650.0000 mg | ORAL_TABLET | Freq: Four times a day (QID) | ORAL | Status: DC | PRN
  Administered 2012-05-14 – 2012-05-16 (×5): 650 mg via ORAL
  Filled 2012-05-14 (×6): qty 2

## 2012-05-14 MED ORDER — MORPHINE SULFATE 4 MG/ML IJ SOLN
6.0000 mg | INTRAMUSCULAR | Status: DC | PRN
  Administered 2012-05-14 – 2012-05-16 (×8): 6 mg via INTRAVENOUS
  Administered 2012-05-16 – 2012-05-17 (×3): 4 mg via INTRAVENOUS
  Administered 2012-05-18 – 2012-05-20 (×11): 6 mg via INTRAVENOUS
  Filled 2012-05-14 (×4): qty 1
  Filled 2012-05-14: qty 2
  Filled 2012-05-14: qty 1
  Filled 2012-05-14 (×5): qty 2
  Filled 2012-05-14: qty 1
  Filled 2012-05-14: qty 2
  Filled 2012-05-14: qty 1
  Filled 2012-05-14 (×6): qty 2
  Filled 2012-05-14: qty 1
  Filled 2012-05-14: qty 2
  Filled 2012-05-14 (×4): qty 1

## 2012-05-14 NOTE — ED Notes (Signed)
70cc's of old blood (hematoma) aspirated from abdominal drain.

## 2012-05-14 NOTE — Progress Notes (Signed)
INITIAL ADULT NUTRITION ASSESSMENT Date: 05/14/2012   Time: 12:38 PM Reason for Assessment: MST  INTERVENTION: 1.  Modify diet; per MD discretion and based on pt tolerance.  DOCUMENTATION CODES Per approved criteria  -Not Applicable, RD to reassess once pt available    ASSESSMENT: Female 46 y.o.  Dx: hematoma  Hx:  Past Medical History  Diagnosis Date  . Hypertension   . IBS (irritable bowel syndrome)   . Constipation   . Hyperthyroidism   . Heart murmur   . Hyperglycemia   . Tobacco abuse   . Type II diabetes mellitus     for 6 weeks only 09/2011-11/2011   Past Surgical History  Procedure Date  . Cesarean section 85, 87, 95  . Lumbar disc surgery 1993; 2000  . Ulnar nerve repair 1998    left "got my hand crushed"  . Myomectomy 1982-2005    "multiple; mostly w/scopes" (05/13/2012)  . Bowel resection 04/25/2012    Procedure: SMALL BOWEL RESECTION;  Surgeon: Romie Levee, MD;  Location: [facility] OR;  Service: General;  Laterality: N/A;  small bowell resection, incisional hernia repair  . Incisional hernia repair 04/25/2012    Procedure: HERNIA REPAIR INCISIONAL;  Surgeon: Romie Levee, MD;  Location: [facility] OR;  Service: General;  Laterality: N/A;  . Colon surgery 05/13/2012    small bowel resection w/IHR  . Cholecystectomy 11/2011    With complications  . Vaginal hysterectomy 2005  . Tubal ligation 1995  . Collateral ligament repair, knee 1987; 1990's    left  . Implantation vagal nerve stimulator 07/2011    "lower back" (05/13/2012)  . Shoulder arthroscopy w/ rotator cuff repair ~ 2005    left  . Hernia repair 04/2012; 05/13/2012    incisional  . Back surgery     Related Meds:  Scheduled Meds:   . antiseptic oral rinse  15 mL Mouth Rinse q12n4p  . chlorhexidine  15 mL Mouth Rinse BID  . cyclobenzaprine  20 mg Oral QHS  . enoxaparin  40 mg Subcutaneous Q24H  . influenza  inactive virus vaccine  0.5 mL Intramuscular Tomorrow-1000  . iohexol  20 mL Oral Q1 Hr x 2  .  Linaclotide  290 mcg Oral Daily  . lisinopril  20 mg Oral Daily  . metoprolol succinate  100 mg Oral Daily  . morphine  100 mg Oral Q12H  .  morphine injection  6 mg Intravenous Once  . pantoprazole  40 mg Oral Daily  . piperacillin-tazobactam (ZOSYN)  IV  3.375 g Intravenous Q8H  . DISCONTD: lisinopril  20 mg Oral Daily  . DISCONTD: metoprolol succinate  100 mg Oral Daily  . DISCONTD: morphine  100 mg Oral BID  . DISCONTD: morphine  60 mg Oral Q12H   Continuous Infusions:   . dextrose 5 % and 0.45 % NaCl with KCl 20 mEq/L 125 mL/hr at 05/14/12 0654   PRN Meds:.fentaNYL, iohexol, midazolam, morphine, morphine injection, ondansetron, DISCONTD:  HYDROmorphone (DILAUDID) injection  Ht: 5\' 7"  (170.2 cm)  Wt: 199 lb 15.7 oz (90.71 kg)  Ideal Wt: 135 lbs % Ideal Wt: 147%  Usual Wt: 225 lbs % Usual Wt: 88%  Body mass index is 31.32 kg/(m^2).  Food/Nutrition Related Hx: recent ex-lap with complications  Labs:  CMP     Component Value Date/Time   NA 135 05/14/2012 0605   K 4.1 05/14/2012 0605   CL 101 05/14/2012 0605   CO2 25 05/14/2012 0605   GLUCOSE 127* 05/14/2012 0981  BUN 3* 05/14/2012 0605   CREATININE 0.60 05/14/2012 0605   CREATININE 0.62 04/16/2012 1419   CALCIUM 10.8* 05/14/2012 0605   CALCIUM 11.0* 12/21/2011 1432   PROT 7.5 05/13/2012 1600   ALBUMIN 3.1* 05/13/2012 1600   AST 10 05/13/2012 1600   ALT 8 05/13/2012 1600   ALKPHOS 99 05/13/2012 1600   BILITOT 0.5 05/13/2012 1600   GFRNONAA >90 05/14/2012 0605   GFRAA >90 05/14/2012 0605    CBC    Component Value Date/Time   WBC 9.7 05/14/2012 0605   RBC 3.77* 05/14/2012 0605   HGB 10.4* 05/14/2012 0605   HCT 30.7* 05/14/2012 0605   PLT 403* 05/14/2012 0605   MCV 81.4 05/14/2012 0605   MCH 27.6 05/14/2012 0605   MCHC 33.9 05/14/2012 0605   RDW 13.9 05/14/2012 0605   LYMPHSABS 1.8 05/13/2012 1600   MONOABS 0.8 05/13/2012 1600   EOSABS 0.1 05/13/2012 1600   BASOSABS 0.0 05/13/2012 1600    Intake: NPO Output:   Intake/Output  Summary (Last 24 hours) at 05/14/12 1241 Last data filed at 05/14/12 1000  Gross per 24 hour  Intake    240 ml  Output    401 ml  Net   -161 ml   No stool since admission.  Diet Order: NPO  Supplements/Tube Feeding:  None at this time  IVF:    dextrose 5 % and 0.45 % NaCl with KCl 20 mEq/L Last Rate: 125 mL/hr at 05/14/12 0654    Estimated Nutritional Needs:   Kcal: 2260-2530 Protein: 108-126g Fluid: >2.0 L/day  Pt unavailable at time of visit, in IR for CT guided abscess drainage.  Pt is s/p ex-lap with small bowel resection for small bowel anastomotic stricture as well as incisional hernia repair with post-op complications. Pt reported wt loss and decreased appetite on admission. Review of chart shows pt with 26 lbs (11.5%) wt loss over the past 6 months. Suspect some level of malnutrition, dx pending nutrition-focused physical exam and assessment in intake hx. RD to follow.  Plans for surgery tomorrow.  NUTRITION DIAGNOSIS: -Inadequate oral intake (NI-2.1).  Status: Ongoing  RELATED TO: omission of energy dense foods  AS EVIDENCE BY: pt NPO for IR eval  MONITORING/EVALUATION(Goals): 1.  Food/Beverage; diet advancement with tolerance 2. Wt/wt change; monitor trends  EDUCATION NEEDS: -Education not appropriate at this time    Hoyt Koch 05/14/2012, 12:38 PM

## 2012-05-14 NOTE — Progress Notes (Signed)
Utilization review complete 

## 2012-05-14 NOTE — Procedures (Signed)
Successful LLQ MESENTERIC HEMATOMA VS ABSCESS DRAIN 270CC dark old blood aspirated No comp Stable Gs/cx pending Full report in pacs

## 2012-05-14 NOTE — H&P (Signed)
Chief Complaint: Abdominal abscess Referring Physician:Dr. Romie Levee HPI: Emma Munoz is an 46 y.o. female who underwent recent Ex lap and SBR about 3 weeks ago, now with presumed infected intra-abdominal hematoma. Request for CT guided perc drainage.  Past Medical History:  Past Medical History  Diagnosis Date  . Hypertension   . IBS (irritable bowel syndrome)   . Constipation   . Hyperthyroidism   . Heart murmur   . Hyperglycemia   . Tobacco abuse   . Type II diabetes mellitus     for 6 weeks only 09/2011-11/2011    Past Surgical History:  Past Surgical History  Procedure Date  . Cesarean section 85, 87, 95  . Lumbar disc surgery 1993; 2000  . Ulnar nerve repair 1998    left "got my hand crushed"  . Myomectomy 1982-2005    "multiple; mostly w/scopes" (05/13/2012)  . Bowel resection 04/25/2012    Procedure: SMALL BOWEL RESECTION;  Surgeon: Romie Levee, MD;  Location: [facility] OR;  Service: General;  Laterality: N/A;  small bowell resection, incisional hernia repair  . Incisional hernia repair 04/25/2012    Procedure: HERNIA REPAIR INCISIONAL;  Surgeon: Romie Levee, MD;  Location: [facility] OR;  Service: General;  Laterality: N/A;  . Colon surgery 05/13/2012    small bowel resection w/IHR  . Cholecystectomy 11/2011    With complications  . Vaginal hysterectomy 2005  . Tubal ligation 1995  . Collateral ligament repair, knee 1987; 1990's    left  . Implantation vagal nerve stimulator 07/2011    "lower back" (05/13/2012)  . Shoulder arthroscopy w/ rotator cuff repair ~ 2005    left  . Hernia repair 04/2012; 05/13/2012    incisional  . Back surgery     Family History:  Family History  Problem Relation Age of Onset  . Early death Father 43    GUNSHOT  . Kidney disease Brother     s/p transplant  . Heart disease Maternal Grandmother     Heart defect  . Prostate cancer Maternal Grandfather   . Cancer Maternal Grandfather     prostate    Social History:  reports that she  has been smoking Cigarettes.  She has a 24 pack-year smoking history. She has never used smokeless tobacco. She reports that she does not drink alcohol or use illicit drugs.  Allergies:  Allergies  Allergen Reactions  . Aspirin Anaphylaxis    REACTION: Tongue, throat, extremities swell  . Dilaudid (Hydromorphone Hcl) Nausea And Vomiting  . Ibuprofen Anaphylaxis    REACTION: Tongue, throat, extremities swell  . Latex Hives  . Nsaids Anaphylaxis    REACTION: Tongue, throat, extremities swell  . Gadolinium Derivatives Nausea And Vomiting and Other (See Comments)    Pt states nausea and vomiting to GAD in MRI pt states she does ok with iv/cm in cat scan    Medications: Medications Prior to Admission  Medication Sig Dispense Refill  . cyclobenzaprine (FLEXERIL) 10 MG tablet Take 20 mg by mouth at bedtime.      Marland Kitchen esomeprazole (NEXIUM) 40 MG capsule Take 40 mg by mouth daily.      . fluconazole (DIFLUCAN) 200 MG tablet Take 200 mg by mouth daily.      . Linaclotide (LINZESS) 290 MCG CAPS Take 290 mcg by mouth daily.       Marland Kitchen lisinopril (PRINIVIL,ZESTRIL) 20 MG tablet Take 20 mg by mouth daily.      . metoprolol succinate (TOPROL-XL) 100 MG 24 hr tablet  Take 100 mg by mouth daily. Take with or immediately following a meal.      . morphine (KADIAN) 100 MG 24 hr capsule Take 100 mg by mouth 2 (two) times daily.       Marland Kitchen morphine (MSIR) 30 MG tablet Take 3 tablets (90 mg total) by mouth every 4 (four) hours as needed. For breakthrough pain  120 tablet  0  . DISCONTD: fluconazole (DIFLUCAN) 200 MG tablet Take 1 tablet (200 mg total) by mouth daily.  3 tablet  2    Please HPI for pertinent positives, otherwise complete 10 system ROS negative.  Physical Exam: Blood pressure 130/90, pulse 87, temperature 99.1 F (37.3 C), temperature source Oral, resp. rate 18, height 5\' 7"  (1.702 m), weight 199 lb 15.7 oz (90.71 kg), SpO2 98.00%. Body mass index is 31.32 kg/(m^2).   General Appearance:  Alert,  cooperative, no distress, appears stated age  Head:  Normocephalic, without obvious abnormality, atraumatic  ENT: Unremarkable  Neck: Supple, symmetrical, trachea midline, no adenopathy, thyroid: not enlarged, symmetric, no tenderness/mass/nodules  Lungs:   Clear to auscultation bilaterally, no w/r/r, respirations unlabored without use of accessory muscles.  Chest Wall:  No tenderness or deformity  Heart:  Regular rate and rhythm, S1, S2 normal, no murmur, rub or gallop. Carotids 2+ without bruit.  Abdomen:   Soft, non-tender, non distended. Midline incision with open surgical wound  Neurologic: Normal affect, no gross deficits.   Results for orders placed during the hospital encounter of 05/13/12 (from the past 48 hour(s))  CULTURE, BLOOD (ROUTINE X 2)     Status: Normal (Preliminary result)   Collection Time   05/13/12  2:50 PM      Component Value Range Comment   Specimen Description BLOOD LEFT ARM      Special Requests BOTTLES DRAWN AEROBIC ONLY 10CC      Culture  Setup Time 05/13/2012 22:46      Culture        Value:        BLOOD CULTURE RECEIVED NO GROWTH TO DATE CULTURE WILL BE HELD FOR 5 DAYS BEFORE ISSUING A FINAL NEGATIVE REPORT   Report Status PENDING     CULTURE, BLOOD (ROUTINE X 2)     Status: Normal (Preliminary result)   Collection Time   05/13/12  3:10 PM      Component Value Range Comment   Specimen Description BLOOD LEFT ARM      Special Requests BOTTLES DRAWN AEROBIC ONLY 10CC      Culture  Setup Time 05/13/2012 22:46      Culture        Value:        BLOOD CULTURE RECEIVED NO GROWTH TO DATE CULTURE WILL BE HELD FOR 5 DAYS BEFORE ISSUING A FINAL NEGATIVE REPORT   Report Status PENDING     CBC WITH DIFFERENTIAL     Status: Abnormal   Collection Time   05/13/12  4:00 PM      Component Value Range Comment   WBC 11.1 (*) 4.0 - 10.5 K/uL    RBC 4.12  3.87 - 5.11 MIL/uL    Hemoglobin 11.6 (*) 12.0 - 15.0 g/dL    HCT 16.1 (*) 09.6 - 46.0 %    MCV 82.0  78.0 - 100.0 fL     MCH 28.2  26.0 - 34.0 pg    MCHC 34.3  30.0 - 36.0 g/dL    RDW 04.5  40.9 - 81.1 %  Platelets 446 (*) 150 - 400 K/uL    Neutrophils Relative 75  43 - 77 %    Neutro Abs 8.3 (*) 1.7 - 7.7 K/uL    Lymphocytes Relative 17  12 - 46 %    Lymphs Abs 1.8  0.7 - 4.0 K/uL    Monocytes Relative 8  3 - 12 %    Monocytes Absolute 0.8  0.1 - 1.0 K/uL    Eosinophils Relative 1  0 - 5 %    Eosinophils Absolute 0.1  0.0 - 0.7 K/uL    Basophils Relative 0  0 - 1 %    Basophils Absolute 0.0  0.0 - 0.1 K/uL   COMPREHENSIVE METABOLIC PANEL     Status: Abnormal   Collection Time   05/13/12  4:00 PM      Component Value Range Comment   Sodium 137  135 - 145 mEq/L    Potassium 3.8  3.5 - 5.1 mEq/L    Chloride 100  96 - 112 mEq/L    CO2 27  19 - 32 mEq/L    Glucose, Bld 112 (*) 70 - 99 mg/dL    BUN 4 (*) 6 - 23 mg/dL    Creatinine, Ser 1.61  0.50 - 1.10 mg/dL    Calcium 09.6 (*) 8.4 - 10.5 mg/dL    Total Protein 7.5  6.0 - 8.3 g/dL    Albumin 3.1 (*) 3.5 - 5.2 g/dL    AST 10  0 - 37 U/L    ALT 8  0 - 35 U/L    Alkaline Phosphatase 99  39 - 117 U/L    Total Bilirubin 0.5  0.3 - 1.2 mg/dL    GFR calc non Af Amer >90  >90 mL/min    GFR calc Af Amer >90  >90 mL/min   MAGNESIUM     Status: Normal   Collection Time   05/13/12  4:00 PM      Component Value Range Comment   Magnesium 2.1  1.5 - 2.5 mg/dL   PHOSPHORUS     Status: Normal   Collection Time   05/13/12  4:00 PM      Component Value Range Comment   Phosphorus 2.6  2.3 - 4.6 mg/dL   URINALYSIS, ROUTINE W REFLEX MICROSCOPIC     Status: Abnormal   Collection Time   05/13/12  5:39 PM      Component Value Range Comment   Color, Urine AMBER (*) YELLOW BIOCHEMICALS MAY BE AFFECTED BY COLOR   APPearance TURBID (*) CLEAR    Specific Gravity, Urine 1.019  1.005 - 1.030    pH 7.0  5.0 - 8.0    Glucose, UA NEGATIVE  NEGATIVE mg/dL    Hgb urine dipstick NEGATIVE  NEGATIVE    Bilirubin Urine SMALL (*) NEGATIVE    Ketones, ur NEGATIVE  NEGATIVE  mg/dL    Protein, ur 045 (*) NEGATIVE mg/dL    Urobilinogen, UA 2.0 (*) 0.0 - 1.0 mg/dL    Nitrite NEGATIVE  NEGATIVE    Leukocytes, UA TRACE (*) NEGATIVE   URINE MICROSCOPIC-ADD ON     Status: Abnormal   Collection Time   05/13/12  5:39 PM      Component Value Range Comment   Squamous Epithelial / LPF FEW (*) RARE    WBC, UA 0-2  <3 WBC/hpf    Bacteria, UA FEW (*) RARE    Casts GRANULAR CAST (*) NEGATIVE    Crystals CA OXALATE  CRYSTALS (*) NEGATIVE    Urine-Other MUCOUS PRESENT     BASIC METABOLIC PANEL     Status: Abnormal   Collection Time   05/14/12  6:05 AM      Component Value Range Comment   Sodium 135  135 - 145 mEq/L    Potassium 4.1  3.5 - 5.1 mEq/L    Chloride 101  96 - 112 mEq/L    CO2 25  19 - 32 mEq/L    Glucose, Bld 127 (*) 70 - 99 mg/dL    BUN 3 (*) 6 - 23 mg/dL    Creatinine, Ser 4.09  0.50 - 1.10 mg/dL    Calcium 81.1 (*) 8.4 - 10.5 mg/dL    GFR calc non Af Amer >90  >90 mL/min    GFR calc Af Amer >90  >90 mL/min   CBC     Status: Abnormal   Collection Time   05/14/12  6:05 AM      Component Value Range Comment   WBC 9.7  4.0 - 10.5 K/uL    RBC 3.77 (*) 3.87 - 5.11 MIL/uL    Hemoglobin 10.4 (*) 12.0 - 15.0 g/dL    HCT 91.4 (*) 78.2 - 46.0 %    MCV 81.4  78.0 - 100.0 fL    MCH 27.6  26.0 - 34.0 pg    MCHC 33.9  30.0 - 36.0 g/dL    RDW 95.6  21.3 - 08.6 %    Platelets 403 (*) 150 - 400 K/uL    Ct Abdomen Pelvis W Contrast  05/13/2012  *RADIOLOGY REPORT*  Clinical Data: At at and postoperative fever.  Evaluate abscess.  CT ABDOMEN AND PELVIS WITH CONTRAST  Technique:  Multidetector CT imaging of the abdomen and pelvis was performed following the standard protocol during bolus administration of intravenous contrast.  Contrast: OMNIPAQUE IOHEXOL 300 MG/ML  SOLN  Comparison: 05/04/2012.  Findings: The lung bases are clear except for dependent atelectasis.  The solid abdominal organs are normal and stable.  The gallbladder is surgically absent.  Mild stable  common bile duct dilatation.  The hematoma identified on prior CT now has a appearance of an infected hematoma/abscess with thick rim enhancement and surrounding inflammatory changes.  This measures 15 x 11 x 7.5 cm. This begins in the small bowel mesentery at the level of the left renal vein and extends down into the upper central pelvis.  The stomach, duodenum, small bowel and colon are grossly normal. There is mild colonic wall thickening involving the descending colon but this may be in part due to lack of distention.  There is scattered colonic diverticulosis.  The aorta is normal in caliber.  The major branch vessels are normal.  The small scattered mesenteric and retroperitoneal lymph nodes are noted.  These are likely inflammatory.  The uterus is surgically absent.  The ovaries are still present and appear normal.  There is a small cyst associated the left ovary. The bladder is normal.  No pelvic mass or adenopathy.  No inguinal mass or hernia.  IMPRESSION: Large intra abdominal and upper pelvic abscess, likely infected hematoma.   Original Report Authenticated By: P. Loralie Champagne, M.D.     Assessment/Plan Intra-abdominal abscess/infected hematoma-post recent open abd surgery Reviewed imaging, amenable to attempt at CT guided perc drainage placement. Stat coags pending though no hx of anticoagulation Consent signed in chart  Brayton El PA-C 05/14/2012, 9:33 AM

## 2012-05-15 ENCOUNTER — Encounter (INDEPENDENT_AMBULATORY_CARE_PROVIDER_SITE_OTHER): Payer: Medicare Other | Admitting: General Surgery

## 2012-05-15 DIAGNOSIS — IMO0002 Reserved for concepts with insufficient information to code with codable children: Secondary | ICD-10-CM | POA: Diagnosis present

## 2012-05-15 MED ORDER — CIPROFLOXACIN HCL 500 MG PO TABS
500.0000 mg | ORAL_TABLET | Freq: Two times a day (BID) | ORAL | Status: DC
  Administered 2012-05-15: 500 mg via ORAL
  Filled 2012-05-15 (×5): qty 1

## 2012-05-15 NOTE — Progress Notes (Signed)
<  principal problem not specified>  Subjective: Patient is s/p drain for an abd fluid collection.  Gram stain reveals moderate GNR.  Fevers and pain overnight, relieved with tylenol and IV morphine.  Pt reports abd pain and fatigue as main symptoms.    Objective: Vital signs in last 24 hours: Temp:  [98.2 F (36.8 C)-102.2 F (39 C)] 100.2 F (37.9 C) (10/03 0607) Pulse Rate:  [73-103] 100  (10/03 0607) Resp:  [18-22] 20  (10/03 0607) BP: (128-164)/(87-114) 155/103 mmHg (10/03 0607) SpO2:  [95 %-100 %] 99 % (10/03 0607) Last BM Date: 05/13/12  Intake/Output from previous day: 10/02 0701 - 10/03 0700 In: 4866.7 [I.V.:4866.7] Out: 76 [Urine:1; Drains:75] Intake/Output this shift:    General appearance: alert, cooperative and fatigued Resp: clear to auscultation bilaterally GI: soft, nondistended Incision/Wound: clean Drain with dark bloody discharge  Lab Results:  Results for orders placed during the hospital encounter of 05/13/12 (from the past 24 hour(s))  CULTURE, ROUTINE-ABSCESS     Status: Normal (Preliminary result)   Collection Time   05/14/12 11:28 AM      Component Value Range   Specimen Description ABSCESS ABDOMEN DRAINAGE     Special Requests Normal     Gram Stain       Value: MODERATE WBC PRESENT, PREDOMINANTLY PMN     RARE SQUAMOUS EPITHELIAL CELLS PRESENT     MODERATE GRAM NEGATIVE RODS   Culture Culture reincubated for better growth     Report Status PENDING       Studies/Results Radiology: CT with Abd fluid collection     MEDS, Scheduled    . antiseptic oral rinse  15 mL Mouth Rinse q12n4p  . chlorhexidine  15 mL Mouth Rinse BID  . cyclobenzaprine  20 mg Oral QHS  . enoxaparin  40 mg Subcutaneous Q24H  . Linaclotide  290 mcg Oral Daily  . lisinopril  20 mg Oral Daily  . metoprolol succinate  100 mg Oral Daily  . morphine  100 mg Oral Q12H  .  morphine injection  6 mg Intravenous Once  . pantoprazole  40 mg Oral Daily  .  piperacillin-tazobactam (ZOSYN)  IV  3.375 g Intravenous Q8H  . DISCONTD: hydrALAZINE  10 mg Intravenous Once     Assessment: <principal problem not specified> ABd abscess  Plan: Will switch abx to PO cipro. Advance diet Will d/c once fevers subside  LOS: 2 days    Vanita Panda, MD [facility], [place] [phone]   05/15/2012 10:15 AM

## 2012-05-15 NOTE — Treatment Plan (Signed)
This is a late entry for 05/14/12.  I saw the patient that morning.  I discussed the results of the CT with her.    Her abd was soft and nondistended  A/P: CT guided drain placement.  Will advance diet afterwards.  Cont Zosyn for suspected abscess.

## 2012-05-15 NOTE — Progress Notes (Signed)
Nutrition Follow-up  Intervention:   1.  Meals/snacks;  RD to order snacks for pt.  Care order placed for RN to assist in offering.  Assessment:   Pt states she was eating relatively well PTA.  Prior to surgery (9/13), she was unable to eat anything without emesis or extreme pain.  Pt states that since surgery, she had improved to eating small meals/snacks with tolerance.  Based on dietary recall, pt was eating very small portions (1 meal = 8 french fries).  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  Pt is anticipating eating regular food for lunch today.  Dicussed intake goals including increased kcal and protein intake.  Pt and RD made a plan for small frequent meals/snacks.  RD to order for pt.  Food preferences obtained.  Diet Order:  Regular  Meds: Scheduled Meds:   . antiseptic oral rinse  15 mL Mouth Rinse q12n4p  . chlorhexidine  15 mL Mouth Rinse BID  . ciprofloxacin  500 mg Oral BID  . cyclobenzaprine  20 mg Oral QHS  . enoxaparin  40 mg Subcutaneous Q24H  . Linaclotide  290 mcg Oral Daily  . lisinopril  20 mg Oral Daily  . metoprolol succinate  100 mg Oral Daily  . morphine  100 mg Oral Q12H  .  morphine injection  6 mg Intravenous Once  . pantoprazole  40 mg Oral Daily  . DISCONTD: hydrALAZINE  10 mg Intravenous Once  . DISCONTD: piperacillin-tazobactam (ZOSYN)  IV  3.375 g Intravenous Q8H   Continuous Infusions:   . dextrose 5 % and 0.45 % NaCl with KCl 20 mEq/L 125 mL/hr at 05/15/12 0212   PRN Meds:.acetaminophen, hydrALAZINE, morphine, morphine injection, ondansetron, DISCONTD:  morphine injection  Labs:  CMP     Component Value Date/Time   NA 135 05/14/2012 0605   K 4.1 05/14/2012 0605   CL 101 05/14/2012 0605   CO2 25 05/14/2012 0605   GLUCOSE 127* 05/14/2012 0605   BUN 3* 05/14/2012 0605   CREATININE 0.60 05/14/2012 0605   CREATININE 0.62 04/16/2012  1419   CALCIUM 10.8* 05/14/2012 0605   CALCIUM 11.0* 12/21/2011 1432   PROT 7.5 05/13/2012 1600   ALBUMIN 3.1* 05/13/2012 1600   AST 10 05/13/2012 1600   ALT 8 05/13/2012 1600   ALKPHOS 99 05/13/2012 1600   BILITOT 0.5 05/13/2012 1600   GFRNONAA >90 05/14/2012 0605   GFRAA >90 05/14/2012 0605     Intake/Output Summary (Last 24 hours) at 05/15/12 1217 Last data filed at 05/15/12 0700  Gross per 24 hour  Intake 4866.67 ml  Output     75 ml  Net 4791.67 ml    Weight Status:  199 lbs, no new wt  Restatement of needs:  2260-2530 kcal, 108-126g protein, >2.0 L/day fluid  Nutrition Dx:  Inadequate oral intake, ongoing  Monitor:  1. Food/Beverage; diet advancement with tolerance.  Somewhat met, pt diet advanced this afternoon, first try to arrive at lunch.  2. Wt/wt change; monitor trends. Ongoing.     Loyce Dys, MS RD LDN Clinical Inpatient Dietitian Pager: [phone] Weekend/After hours pager: [phone]

## 2012-05-15 NOTE — Progress Notes (Signed)
Subjective: [name] drain placed 10/2 Post small bowel resection Pt maybe some better   Objective: Vital signs in last 24 hours: Temp:  [99.4 F (37.4 C)-102.2 F (39 C)] 100.3 F (37.9 C) (10/03 1030) Pulse Rate:  [96-103] 102  (10/03 1030) Resp:  [18-22] 18  (10/03 1030) BP: (149-164)/(93-114) 154/100 mmHg (10/03 1030) SpO2:  [96 %-99 %] 98 % (10/03 1030) Last BM Date: 05/13/12  Intake/Output from previous day: 10/02 0701 - 10/03 0700 In: 4866.7 [I.V.:4866.7] Out: 76 [Urine:1; Drains:75] Intake/Output this shift:    PE:  VSS T max: 102.2 T: 100.3 now Site of drain intact; tender No bleeding; clean and dry cxs pending 75 cc yesterday; dark blood Minimal in bag now  Lab Results:   Basename 05/14/12 0605 05/13/12 1600  WBC 9.7 11.1*  HGB 10.4* 11.6*  HCT 30.7* 33.8*  PLT 403* 446*   BMET  Basename 05/14/12 0605 05/13/12 1600  NA 135 137  K 4.1 3.8  CL 101 100  CO2 25 27  GLUCOSE 127* 112*  BUN 3* 4*  CREATININE 0.60 0.50  CALCIUM 10.8* 12.0*   PT/INR  Basename 05/14/12 0913  LABPROT 14.3  INR 1.13   ABG No results found for this basename: PHART:2,PCO2:2,PO2:2,HCO3:2 in the last 72 hours  Studies/Results: Ct Guided Abscess Drain  05/14/2012  *RADIOLOGY REPORT*  Clinical Data:  left lower quadrant mesenteric hematoma versus abscess, abdominal pain and discomfort, elevated white count  CT FLUOROSCOPY GUIDED LEFT LOWER QUADRANT MESENTERIC CHRONIC HEMATOMA VERSUS ABSCESS DRAIN INSERTION.  Date:  05/14/2012 09:45:00  Radiologist:  Judie Petit. Ruel Favors, M.D.  Medications:  Versed and Fentanyl for conscious sedation  Guidance:  CT fluoroscopy  Fluoroscopy time:  12 seconds  Sedation time:  15 minutes  Contrast volume:  None.  Complications:  No immediate  PROCEDURE/FINDINGS:  Informed consent was obtained from the patient following explanation of the procedure, risks, benefits and alternatives. The patient understands, agrees and consents for the  procedure. All questions were addressed.  A time out was performed.  Maximal barrier sterile technique utilized including caps, mask, sterile gowns, sterile gloves, large sterile drape, hand hygiene, and betadine  Previous imaging reviewed.  The patient was positioned supine. Noncontrast localization CT performed.  The left lower quadrant mesenteric fluid collection was localized.  Under sterile conditions and local anesthesia, CT fluoroscopy was utilized to advance an 18 gauge needle from an anterior oblique approach into the collection.  Needle position confirmed with CT fluoroscopy. Syringe aspiration yielded dark bloody fluid.  Guide wire advanced followed by tract dilatation to insert 10-French drain.  Drain catheter position confirmed within the collection by repeat CT. Syringe aspiration yielded 270 ml dark bloody fluid compatible with a hematoma, possibly infected.  Sample sent for Gram stain culture. Catheter secured with a Prolene suture and connected to external gravity drainage.  The patient tolerated the procedure well.  No immediate complication.  IMPRESSION: Successful CT fluoroscopy guided left lower quadrant hematoma / abscess drain insertion.   Original Report Authenticated By: Judie Petit. Ruel Favors, M.D.    Ct Abdomen Pelvis W Contrast  05/13/2012  *RADIOLOGY REPORT*  Clinical Data: At at and postoperative fever.  Evaluate abscess.  CT ABDOMEN AND PELVIS WITH CONTRAST  Technique:  Multidetector CT imaging of the abdomen and pelvis was performed following the standard protocol during bolus administration of intravenous contrast.  Contrast: OMNIPAQUE IOHEXOL 300 MG/ML  SOLN  Comparison: 05/04/2012.  Findings: The lung bases are clear except for dependent atelectasis.  The solid abdominal organs are normal and stable.  The gallbladder is surgically absent.  Mild stable common bile duct dilatation.  The hematoma identified on prior CT now has a appearance of an infected hematoma/abscess with thick  rim enhancement and surrounding inflammatory changes.  This measures 15 x 11 x 7.5 cm. This begins in the small bowel mesentery at the level of the left renal vein and extends down into the upper central pelvis.  The stomach, duodenum, small bowel and colon are grossly normal. There is mild colonic wall thickening involving the descending colon but this may be in part due to lack of distention.  There is scattered colonic diverticulosis.  The aorta is normal in caliber.  The major branch vessels are normal.  The small scattered mesenteric and retroperitoneal lymph nodes are noted.  These are likely inflammatory.  The uterus is surgically absent.  The ovaries are still present and appear normal.  There is a small cyst associated the left ovary. The bladder is normal.  No pelvic mass or adenopathy.  No inguinal mass or hernia.  IMPRESSION: Large intra abdominal and upper pelvic abscess, likely infected hematoma.   Original Report Authenticated By: P. Loralie Champagne, M.D.     Anti-infectives: Anti-infectives     Start     Dose/Rate Route Frequency Ordered Stop   05/15/12 1100   ciprofloxacin (CIPRO) tablet 500 mg        500 mg Oral 2 times daily 05/15/12 1018     05/13/12 1500   piperacillin-tazobactam (ZOSYN) IVPB 3.375 g  Status:  Discontinued        3.375 g 12.5 mL/hr over 240 Minutes Intravenous 3 times per day 05/13/12 1344 05/15/12 1018          Assessment/Plan: s/p * No surgery found *   LOS: 2 days  [name] drain placed 10/2 Will follow  Emma Munoz A 05/15/2012

## 2012-05-16 LAB — URINALYSIS, ROUTINE W REFLEX MICROSCOPIC
Glucose, UA: NEGATIVE mg/dL
Ketones, ur: NEGATIVE mg/dL
Leukocytes, UA: NEGATIVE
Nitrite: NEGATIVE
Protein, ur: 100 mg/dL — AB
Urobilinogen, UA: 1 mg/dL (ref 0.0–1.0)

## 2012-05-16 LAB — URINE MICROSCOPIC-ADD ON

## 2012-05-16 MED ORDER — PIPERACILLIN-TAZOBACTAM 3.375 G IVPB
3.3750 g | Freq: Three times a day (TID) | INTRAVENOUS | Status: DC
  Administered 2012-05-16 – 2012-05-19 (×10): 3.375 g via INTRAVENOUS
  Filled 2012-05-16 (×12): qty 50

## 2012-05-16 MED ORDER — FLUCONAZOLE 200 MG PO TABS
200.0000 mg | ORAL_TABLET | Freq: Every day | ORAL | Status: DC
  Administered 2012-05-16 – 2012-05-20 (×5): 200 mg via ORAL
  Filled 2012-05-16 (×5): qty 1

## 2012-05-16 MED ORDER — LINACLOTIDE 290 MCG PO CAPS
290.0000 ug | ORAL_CAPSULE | Freq: Every day | ORAL | Status: DC
  Administered 2012-05-18 – 2012-05-20 (×3): 290 ug via ORAL
  Filled 2012-05-16: qty 1

## 2012-05-16 MED ORDER — DIFLUNISAL 500 MG PO TABS
500.0000 mg | ORAL_TABLET | Freq: Two times a day (BID) | ORAL | Status: DC
  Filled 2012-05-16 (×2): qty 1

## 2012-05-16 NOTE — Progress Notes (Signed)
Infected hematoma following procedure  Subjective: Patient is s/p drain for an abd fluid collection.  Gram stain reveals moderate GNR.  I started PO cipro yesterday, but pt still with fevers overnight.  IV Zosyn restarted.  Pt reports fatigue as main symptom.    Objective: Vital signs in last 24 hours: Temp:  [99.6 F (37.6 C)-103.1 F (39.5 C)] 100.3 F (37.9 C) (10/04 0536) Pulse Rate:  [99-105] 100  (10/04 0536) Resp:  [17-18] 17  (10/04 0536) BP: (142-168)/(90-104) 151/90 mmHg (10/04 0536) SpO2:  [94 %-99 %] 94 % (10/04 0536) Last BM Date: 05/13/12  Intake/Output from previous day: 10/03 0701 - 10/04 0700 In: 840 [P.O.:840] Out: 17 [Urine:2; Drains:15] Intake/Output this shift:    General appearance: alert, cooperative and fatigued Resp: clear to auscultation bilaterally GI: soft, nondistended Incision/Wound: clean Drain with dark bloody discharge  Lab Results:  No results found for this or any previous visit (from the past 24 hour(s)).   Studies/Results Radiology: CT with Abd fluid collection     MEDS, Scheduled    . cyclobenzaprine  20 mg Oral QHS  . diflunisal  500 mg Oral BID  . enoxaparin  40 mg Subcutaneous Q24H  . Linaclotide  290 mcg Oral Daily  . lisinopril  20 mg Oral Daily  . metoprolol succinate  100 mg Oral Daily  . morphine  100 mg Oral Q12H  . pantoprazole  40 mg Oral Daily  . piperacillin-tazobactam (ZOSYN)  IV  3.375 g Intravenous Q8H  . DISCONTD: antiseptic oral rinse  15 mL Mouth Rinse q12n4p  . DISCONTD: chlorhexidine  15 mL Mouth Rinse BID  . DISCONTD: ciprofloxacin  500 mg Oral BID     Assessment: Infected hematoma following procedure ABd abscess  Plan: GNR on fluid aspiration, awaiting culture results.  Will continue IV zosyn until sensitivities return. Reg diet Will check repeat CBC tom Pt reports dysuria and pain similar to previous yeast infections- will get UA and start diflucan May need repeat CT if she continues to  have fevers on abx  LOS: 3 days    Vanita Panda, MD [facility], [place] [phone]   05/16/2012 12:10 PM

## 2012-05-16 NOTE — Progress Notes (Signed)
Subjective: RLQ hematoma drain placed 10/2 Pt standing in room to go to bathroom Feels better today  Objective: Vital signs in last 24 hours: Temp:  [99.6 F (37.6 C)-103.1 F (39.5 C)] 100.3 F (37.9 C) (10/04 0536) Pulse Rate:  [99-105] 100  (10/04 0536) Resp:  [17-18] 17  (10/04 0536) BP: (142-168)/(90-104) 151/90 mmHg (10/04 0536) SpO2:  [94 %-99 %] 94 % (10/04 0536) Last BM Date: 05/13/12  Intake/Output from previous day: 10/03 0701 - 10/04 0700 In: 840 [P.O.:840] Out: 17 [Urine:2; Drains:15] Intake/Output this shift:    PE:  TMax: 103.1/ 100.3 now VSS Drain output 15 cc yesterday; minimal in bag now Bloody fluid Drain intact; site clean and dry Tender to touch Cx: G- rods   Lab Results:   Basename 05/14/12 0605 05/13/12 1600  WBC 9.7 11.1*  HGB 10.4* 11.6*  HCT 30.7* 33.8*  PLT 403* 446*   BMET  Basename 05/14/12 0605 05/13/12 1600  NA 135 137  K 4.1 3.8  CL 101 100  CO2 25 27  GLUCOSE 127* 112*  BUN 3* 4*  CREATININE 0.60 0.50  CALCIUM 10.8* 12.0*   PT/INR  Basename 05/14/12 0913  LABPROT 14.3  INR 1.13   ABG No results found for this basename: PHART:2,PCO2:2,PO2:2,HCO3:2 in the last 72 hours  Studies/Results: Ct Guided Abscess Drain  05/14/2012  *RADIOLOGY REPORT*  Clinical Data:  left lower quadrant mesenteric hematoma versus abscess, abdominal pain and discomfort, elevated white count  CT FLUOROSCOPY GUIDED LEFT LOWER QUADRANT MESENTERIC CHRONIC HEMATOMA VERSUS ABSCESS DRAIN INSERTION.  Date:  05/14/2012 09:45:00  Radiologist:  Judie Petit. Ruel Favors, M.D.  Medications:  Versed and Fentanyl for conscious sedation  Guidance:  CT fluoroscopy  Fluoroscopy time:  12 seconds  Sedation time:  15 minutes  Contrast volume:  None.  Complications:  No immediate  PROCEDURE/FINDINGS:  Informed consent was obtained from the patient following explanation of the procedure, risks, benefits and alternatives. The patient understands, agrees and consents for the  procedure. All questions were addressed.  A time out was performed.  Maximal barrier sterile technique utilized including caps, mask, sterile gowns, sterile gloves, large sterile drape, hand hygiene, and betadine  Previous imaging reviewed.  The patient was positioned supine. Noncontrast localization CT performed.  The left lower quadrant mesenteric fluid collection was localized.  Under sterile conditions and local anesthesia, CT fluoroscopy was utilized to advance an 18 gauge needle from an anterior oblique approach into the collection.  Needle position confirmed with CT fluoroscopy. Syringe aspiration yielded dark bloody fluid.  Guide wire advanced followed by tract dilatation to insert 10-French drain.  Drain catheter position confirmed within the collection by repeat CT. Syringe aspiration yielded 270 ml dark bloody fluid compatible with a hematoma, possibly infected.  Sample sent for Gram stain culture. Catheter secured with a Prolene suture and connected to external gravity drainage.  The patient tolerated the procedure well.  No immediate complication.  IMPRESSION: Successful CT fluoroscopy guided left lower quadrant hematoma / abscess drain insertion.   Original Report Authenticated By: Judie Petit. Ruel Favors, M.D.     Anti-infectives: Anti-infectives     Start     Dose/Rate Route Frequency Ordered Stop   05/16/12 0800   piperacillin-tazobactam (ZOSYN) IVPB 3.375 g        3.375 g 12.5 mL/hr over 240 Minutes Intravenous 3 times per day 05/16/12 0730     05/15/12 1100   ciprofloxacin (CIPRO) tablet 500 mg  Status:  Discontinued  500 mg Oral 2 times daily 05/15/12 1018 05/16/12 0730   05/13/12 1500   piperacillin-tazobactam (ZOSYN) IVPB 3.375 g  Status:  Discontinued        3.375 g 12.5 mL/hr over 240 Minutes Intravenous 3 times per day 05/13/12 1344 05/15/12 1018          Assessment/Plan: s/p RLQ hematoma drain placed 10/2 Will follow Need new CT when output minimal;  afeb...  Emma Munoz A 05/16/2012

## 2012-05-17 LAB — CBC
HCT: 31.7 % — ABNORMAL LOW (ref 36.0–46.0)
MCHC: 34.1 g/dL (ref 30.0–36.0)
Platelets: 418 10*3/uL — ABNORMAL HIGH (ref 150–400)
RDW: 14.2 % (ref 11.5–15.5)
WBC: 7 10*3/uL (ref 4.0–10.5)

## 2012-05-17 LAB — BASIC METABOLIC PANEL
Chloride: 98 mEq/L (ref 96–112)
GFR calc Af Amer: >90 mL/min (ref >90)
GFR calc non Af Amer: >90 mL/min (ref >90)
Potassium: 4.3 mEq/L (ref 3.5–5.1)
Sodium: 130 mEq/L — ABNORMAL LOW (ref 135–145)

## 2012-05-17 LAB — CULTURE, ROUTINE-ABSCESS

## 2012-05-17 MED ORDER — ENSURE PUDDING PO PUDG: 1.0000 | Freq: Three times a day (TID) | ORAL | Status: DC

## 2012-05-17 NOTE — Progress Notes (Signed)
Pt seen and examined  abd - soft, TTP throughout  Plan as below per PA  Mary Sella. Andrey Campanile, MD, FACS General, Bariatric, & Minimally Invasive Surgery [facility], [place]

## 2012-05-17 NOTE — Progress Notes (Signed)
Patient ID: Emma Munoz, female   DOB: 16-Mar-1966, 46 y.o.   MRN: [id number]    Subjective: Pt feels ok, but still c/o abdominal pain.  Not eating much.  Doesn't have much appetite  Objective: Vital signs in last 24 hours: Temp:  [98.8 F (37.1 C)-99.3 F (37.4 C)] 99.3 F (37.4 C) (10/05 0559) Pulse Rate:  [80-101] 101  (10/05 0559) Resp:  [18-20] 20  (10/05 0559) BP: (131-141)/(79-89) 139/89 mmHg (10/05 0559) SpO2:  [95 %-98 %] 98 % (10/05 0559) Last BM Date: 05/12/12  Intake/Output from previous day: 10/04 0701 - 10/05 0700 In: 2133.2 [P.O.:480; I.V.:1439.3; IV Piggyback:213.8] Out: 45 [Drains:45] Intake/Output this shift:    PE: Abd: soft, still quite tender, drain with old bloody drainage, few BS  Lab Results:   Basename 05/17/12 0730  WBC 7.0  HGB 10.8*  HCT 31.7*  PLT 418*   BMET  Basename 05/17/12 0730  NA 130*  K 4.3  CL 98  CO2 24  GLUCOSE 102*  BUN 7  CREATININE 0.61  CALCIUM 11.7*   PT/INR  Basename 05/14/12 0913  LABPROT 14.3  INR 1.13   CMP     Component Value Date/Time   NA 130* 05/17/2012 0730   K 4.3 05/17/2012 0730   CL 98 05/17/2012 0730   CO2 24 05/17/2012 0730   GLUCOSE 102* 05/17/2012 0730   BUN 7 05/17/2012 0730   CREATININE 0.61 05/17/2012 0730   CREATININE 0.62 04/16/2012 1419   CALCIUM 11.7* 05/17/2012 0730   CALCIUM 11.0* 12/21/2011 1432   PROT 7.5 05/13/2012 1600   ALBUMIN 3.1* 05/13/2012 1600   AST 10 05/13/2012 1600   ALT 8 05/13/2012 1600   ALKPHOS 99 05/13/2012 1600   BILITOT 0.5 05/13/2012 1600   GFRNONAA >90 05/17/2012 0730   GFRAA >90 05/17/2012 0730   Lipase     Component Value Date/Time   LIPASE 11 04/06/2012 0330       Studies/Results: No results found.  Anti-infectives: Anti-infectives     Start     Dose/Rate Route Frequency Ordered Stop   05/16/12 1400   fluconazole (DIFLUCAN) tablet 200 mg        200 mg Oral Daily 05/16/12 1310     05/16/12 0800  piperacillin-tazobactam (ZOSYN) IVPB 3.375 g       3.375 g 12.5 mL/hr over 240 Minutes Intravenous 3 times per day 05/16/12 0730     05/15/12 1100   ciprofloxacin (CIPRO) tablet 500 mg  Status:  Discontinued        500 mg Oral 2 times daily 05/15/12 1018 05/16/12 0730   05/13/12 1500   piperacillin-tazobactam (ZOSYN) IVPB 3.375 g  Status:  Discontinued        3.375 g 12.5 mL/hr over 240 Minutes Intravenous 3 times per day 05/13/12 1344 05/15/12 1018           Assessment/Plan  1. Infected hematoma, s/p SBR  Plan: 1. Cx reveal pansensitive Klebsiella.  Will leave on Zosyn per Dr. Andrey Campanile. 2. Add ensure as patient is not eating very much 3. Repeat CT scan tomorrow to follow up on hematoma and abdominal pain. 4. Try to mobilize.   LOS: 4 days    [name] 05/17/2012, 9:11 AM Pager: [phone]

## 2012-05-17 NOTE — Progress Notes (Signed)
  Subjective: LLQ hematoma abscess drain placed 10/2 Better today Still sore Tolerating incr diet  Objective: Vital signs in last 24 hours: Temp:  [98.8 F (37.1 C)-99.3 F (37.4 C)] 99.3 F (37.4 C) (10/05 0559) Pulse Rate:  [80-101] 101  (10/05 0559) Resp:  [18-20] 20  (10/05 0559) BP: (131-141)/(79-89) 139/89 mmHg (10/05 0559) SpO2:  [95 %-98 %] 98 % (10/05 0559) Last BM Date: 05/12/12  Intake/Output from previous day: 10/04 0701 - 10/05 0700 In: 2133.2 [P.O.:480; I.V.:1439.3; IV Piggyback:213.8] Out: 45 [Drains:45] Intake/Output this shift:    PE:  VSS; T max 101.9; 99.3 now Drain intact; output 45 cc yesterday - blood 20 cc in bag now Site tender; clean and dry Cx: Klebsiella Pneumoniae  Lab Results:   Basename 05/17/12 0730  WBC 7.0  HGB 10.8*  HCT 31.7*  PLT 418*   BMET  Basename 05/17/12 0730  NA 130*  K 4.3  CL 98  CO2 24  GLUCOSE 102*  BUN 7  CREATININE 0.61  CALCIUM 11.7*   PT/INR  Basename 05/14/12 0913  LABPROT 14.3  INR 1.13   ABG No results found for this basename: PHART:2,PCO2:2,PO2:2,HCO3:2 in the last 72 hours  Studies/Results: No results found.  Anti-infectives:   Assessment/Plan: s/p LLQ hematoma abscess drain placed 10/2 Better Plan per CCS Will follow  Suprina Mandeville A 05/17/2012

## 2012-05-18 ENCOUNTER — Inpatient Hospital Stay (HOSPITAL_COMMUNITY): Payer: Medicare Other

## 2012-05-18 MED ORDER — IOHEXOL 300 MG/ML  SOLN
100.0000 mL | Freq: Once | INTRAMUSCULAR | Status: AC | PRN
  Administered 2012-05-18: 100 mL via INTRAVENOUS

## 2012-05-18 MED ORDER — ENSURE COMPLETE PO LIQD
237.0000 mL | Freq: Two times a day (BID) | ORAL | Status: DC
  Administered 2012-05-18: 237 mL via ORAL

## 2012-05-18 NOTE — Progress Notes (Signed)
Patient ID: Emma Munoz, female   DOB: 1965-09-11, 46 y.o.   MRN: [id number]    Subjective: Pt feels a little better today.  Up walking.  Says walking feels ok, but after she walks she gets diffuse crampy pain and sharp pain at her drain site.  Eating a little better.  Objective: Vital signs in last 24 hours: Temp:  [98 F (36.7 C)-98.8 F (37.1 C)] 98.4 F (36.9 C) (10/06 0600) Pulse Rate:  [76-86] 79  (10/06 0600) Resp:  [20] 20  (10/06 0600) BP: (116-159)/(74-101) 122/81 mmHg (10/06 0600) SpO2:  [95 %-96 %] 95 % (10/06 0600) Last BM Date: 05/12/12  Intake/Output from previous day: 10/05 0701 - 10/06 0700 In: 125 [P.O.:120] Out: 1220 [Urine:1200; Drains:20] Intake/Output this shift:    PE: Abd: soft, but still very tender, drain with minimal output currently, +BS  Lab Results:   Basename 05/17/12 0730  WBC 7.0  HGB 10.8*  HCT 31.7*  PLT 418*   BMET  Basename 05/17/12 0730  NA 130*  K 4.3  CL 98  CO2 24  GLUCOSE 102*  BUN 7  CREATININE 0.61  CALCIUM 11.7*   PT/INR No results found for this basename: LABPROT:2,INR:2 in the last 72 hours CMP     Component Value Date/Time   NA 130* 05/17/2012 0730   K 4.3 05/17/2012 0730   CL 98 05/17/2012 0730   CO2 24 05/17/2012 0730   GLUCOSE 102* 05/17/2012 0730   BUN 7 05/17/2012 0730   CREATININE 0.61 05/17/2012 0730   CREATININE 0.62 04/16/2012 1419   CALCIUM 11.7* 05/17/2012 0730   CALCIUM 11.0* 12/21/2011 1432   PROT 7.5 05/13/2012 1600   ALBUMIN 3.1* 05/13/2012 1600   AST 10 05/13/2012 1600   ALT 8 05/13/2012 1600   ALKPHOS 99 05/13/2012 1600   BILITOT 0.5 05/13/2012 1600   GFRNONAA >90 05/17/2012 0730   GFRAA >90 05/17/2012 0730   Lipase     Component Value Date/Time   LIPASE 11 04/06/2012 0330       Studies/Results: No results found.  Anti-infectives: Anti-infectives     Start     Dose/Rate Route Frequency Ordered Stop   05/16/12 1400   fluconazole (DIFLUCAN) tablet 200 mg        200 mg Oral Daily  05/16/12 1310     05/16/12 0800  piperacillin-tazobactam (ZOSYN) IVPB 3.375 g       3.375 g 12.5 mL/hr over 240 Minutes Intravenous 3 times per day 05/16/12 0730     05/15/12 1100   ciprofloxacin (CIPRO) tablet 500 mg  Status:  Discontinued        500 mg Oral 2 times daily 05/15/12 1018 05/16/12 0730   05/13/12 1500   piperacillin-tazobactam (ZOSYN) IVPB 3.375 g  Status:  Discontinued        3.375 g 12.5 mL/hr over 240 Minutes Intravenous 3 times per day 05/13/12 1344 05/15/12 1018           Assessment/Plan  1. S/p SBR, now with infected hematoma, s/p perc drain  Plan: 1. Cont abx for pansensitive Klebsiella 2. Repeat CT scan today 3. Cont to encourage po intake and ensure.   LOS: 5 days    Junette Bernat E 05/18/2012, 9:17 AM Pager: [phone]

## 2012-05-18 NOTE — Progress Notes (Signed)
Essentially stable. C/o pain. No emesis  Soft, TTP (unchanged)  Cont iv abx for klebsiella infected mesenteric hematoma Check CT today  Mary Sella. Andrey Campanile, MD, FACS General, Bariatric, & Minimally Invasive Surgery [facility], [place]

## 2012-05-19 LAB — CULTURE, BLOOD (ROUTINE X 2): Culture: NO GROWTH

## 2012-05-19 LAB — ANAEROBIC CULTURE: Special Requests: NORMAL

## 2012-05-19 MED ORDER — CIPROFLOXACIN HCL 500 MG PO TABS
500.0000 mg | ORAL_TABLET | Freq: Two times a day (BID) | ORAL | Status: DC
  Administered 2012-05-19 – 2012-05-20 (×2): 500 mg via ORAL
  Filled 2012-05-19 (×4): qty 1

## 2012-05-19 NOTE — Progress Notes (Signed)
Infected hematoma following procedure  Subjective: Patient is s/p drain for an abd fluid collection.  Gram stain reveals moderate GNR. CT shows decreasing size.      Objective: Vital signs in last 24 hours: Temp:  [98.3 F (36.8 C)-99.1 F (37.3 C)] 98.3 F (36.8 C) (10/07 0610) Pulse Rate:  [78-80] 80  (10/07 0610) Resp:  [16-18] 18  (10/07 0610) BP: (115-130)/(71-83) 115/71 mmHg (10/07 0610) SpO2:  [96 %-100 %] 96 % (10/07 0610) Last BM Date: 05/12/12  Intake/Output from previous day: 10/06 0701 - 10/07 0700 In: 490 [P.O.:480] Out: 1810 [Urine:1800; Drains:10] Intake/Output this shift:    General appearance: alert, cooperative and fatigued Resp: clear to auscultation bilaterally GI: soft, nondistended Incision/Wound: clean Drain with dark bloody discharge  Lab Results:  No results found for this or any previous visit (from the past 24 hour(s)).   Studies/Results Radiology: CT with Abd fluid collection that is getting smaller.  Pelvic fluid collection with some increased enhancement     MEDS, Scheduled    . ciprofloxacin  500 mg Oral BID  . cyclobenzaprine  20 mg Oral QHS  . enoxaparin  40 mg Subcutaneous Q24H  . feeding supplement  237 mL Oral BID BM  . fluconazole  200 mg Oral Daily  . Linaclotide  290 mcg Oral Daily  . lisinopril  20 mg Oral Daily  . metoprolol succinate  100 mg Oral Daily  . morphine  100 mg Oral Q12H  . pantoprazole  40 mg Oral Daily  . DISCONTD: piperacillin-tazobactam (ZOSYN)  IV  3.375 g Intravenous Q8H     Assessment: Infected hematoma following procedure ABd abscess  Plan: Pansensitive Klebsiella on hematoma drainage -will switch to PO Cipro today Cont Reg diet Will check repeat CBC tom Given no new fevers, will continue abx and monitor pelvic fluid collection.  If develops new fevers, will plan on CT guided drain  LOS: 6 days    Vanita Panda, MD [facility], [place] [phone]   05/19/2012 11:47  AM

## 2012-05-19 NOTE — Progress Notes (Addendum)
Subjective: Pt ok, feeling a little bit better. On reg diet but not much appetite. Had been having low grade fevers over the past weekend but has been afebrile past 24hrs. CT scan done yesterday  Objective: Physical Exam: BP 115/71  Pulse 80  Temp 98.3 F (36.8 C) (Oral)  Resp 18  Ht 5\' 7"  (1.702 m)  Wt 199 lb 15.7 oz (90.71 kg)  BMI 31.32 kg/m2  SpO2 96% Drain intact, site clean Old bloody output, scant   Labs: CBC  Basename 05/17/12 0730  WBC 7.0  HGB 10.8*  HCT 31.7*  PLT 418*   BMET  Basename 05/17/12 0730  NA 130*  K 4.3  CL 98  CO2 24  GLUCOSE 102*  BUN 7  CREATININE 0.61  CALCIUM 11.7*   LFT No results found for this basename: PROT,ALBUMIN,AST,ALT,ALKPHOS,BILITOT,BILIDIR,IBILI,LIPASE in the last 72 hours PT/INR No results found for this basename: LABPROT:2,INR:2 in the last 72 hours   Studies/Results: Ct Abdomen Pelvis W Contrast  05/18/2012  *RADIOLOGY REPORT*  Clinical Data: Infective mesenteric hematoma status post percutaneous drainage.  CT ABDOMEN AND PELVIS WITH CONTRAST  Technique:  Multidetector CT imaging of the abdomen and pelvis was performed following the standard protocol during bolus administration of intravenous contrast.  Contrast: OMNIPAQUE IOHEXOL 300 MG/ML  SOLN  Comparison: Abdominal pelvic CT 05/13/2012.  Images from abscess drainage procedure 05/14/2012.  Findings: There are increased air space opacities at both lung bases with air bronchograms on the left.  No significant pleural effusion is seen.  The percutaneous drain is stable within the left mesenteric fluid collection.  This fluid collection has decreased from the prior diagnostic study, measuring approximately 8.5 x 5.9 x 9.4 cm (previously 11.3 x 5.7 x 7.6 cm).  There are small air bubbles within this collection.  There is no enteric contrast within the collection.  Bowel anastomoses clips are noted medial to this collection.  Within the pelvis, there is a moderate amount of  new free fluid which shows mild peripheral peritoneal enhancement. This measures up to 8.3 x 3.5 cm transverse on image 78.  The liver, spleen, pancreas, adrenal glands and kidneys appear stable status post cholecystectomy. A nonobstructing calculus in the lower pole of the right kidney is unchanged.  There is no hydronephrosis.  There is no evidence of bowel obstruction. Postsurgical changes within the anterior abdominal wall are stable. The adnexa and bladder appears stable status post partial hysterectomy.  IMPRESSION:  1.  Decreased size of mesenteric fluid collection status post percutaneous drainage as described. 2.  Increased pelvic ascites with peritoneal enhancement.  This could reflect a developing organized fluid collection. 3.  No evidence of bowel obstruction or extravasation of enteric contrast. 4.  Increased bibasilar air space opacities with air bronchograms in the left lower lobe.   Original Report Authenticated By: Gerrianne Scale, M.D.     Assessment/Plan: Abdominal abscess/infected hematoma s/p perc drain 10/2 Cx is Klebsiella---pansensitive CT shows large fluid collection is now some smaller with drain in good position. However, new fluid collection in low pelvis Will review images with IR MD and await input from CCS as to whether this new collection should be drained. Will make her NPO and hold Lovenox just in case.    LOS: 6 days    Brayton El PA-C 05/19/2012 11:00 AM  Dr. Maisie Fus' note and plan reviewed. Will continue to follow.  12:55 PM

## 2012-05-19 NOTE — Progress Notes (Signed)
Nutrition Follow-up  Intervention:   1.  Calorie count; order clarified.  RN to hang envelope and collect tray tickets with documented intake. 2.  Meal/snacks; RD to continue snack orders for pt  Assessment:   RD spoke with pt about intake over the weekend which has continued to be variable at 0-50%. RD informed pt of kcal count to which pt stated 'I have been managing my nutrition fine for 44 years.'  RD encouraged pt of joint effort to ensure pt is healing and that her needs are being met.    Diet Order:  Regular  Meds: Scheduled Meds:   . ciprofloxacin  500 mg Oral BID  . cyclobenzaprine  20 mg Oral QHS  . enoxaparin  40 mg Subcutaneous Q24H  . feeding supplement  237 mL Oral BID BM  . fluconazole  200 mg Oral Daily  . Linaclotide  290 mcg Oral Daily  . lisinopril  20 mg Oral Daily  . metoprolol succinate  100 mg Oral Daily  . morphine  100 mg Oral Q12H  . pantoprazole  40 mg Oral Daily  . DISCONTD: piperacillin-tazobactam (ZOSYN)  IV  3.375 g Intravenous Q8H   Continuous Infusions:   . DISCONTD: dextrose 5 % and 0.45 % NaCl with KCl 20 mEq/L 20 mL/hr (05/16/12 1519)   PRN Meds:.acetaminophen, hydrALAZINE, morphine, morphine injection, ondansetron  Labs:  CMP     Component Value Date/Time   NA 130* 05/17/2012 0730   K 4.3 05/17/2012 0730   CL 98 05/17/2012 0730   CO2 24 05/17/2012 0730   GLUCOSE 102* 05/17/2012 0730   BUN 7 05/17/2012 0730   CREATININE 0.61 05/17/2012 0730   CREATININE 0.62 04/16/2012 1419   CALCIUM 11.7* 05/17/2012 0730   CALCIUM 11.0* 12/21/2011 1432   PROT 7.5 05/13/2012 1600   ALBUMIN 3.1* 05/13/2012 1600   AST 10 05/13/2012 1600   ALT 8 05/13/2012 1600   ALKPHOS 99 05/13/2012 1600   BILITOT 0.5 05/13/2012 1600   GFRNONAA >90 05/17/2012 0730   GFRAA >90 05/17/2012 0730     Intake/Output Summary (Last 24 hours) at 05/19/12 1248 Last data filed at 05/19/12 0700  Gross per 24 hour  Intake    490 ml  Output   1810 ml  Net  -1320 ml    Weight Status:   No new wt  Re-estimated needs:  2260-2530 kcal, 108-126g protein, >2.0 L/day fluid  Nutrition Dx:  Inadequate oral intake, ongoing  Monitor:   1. Food/Beverage; diet advancement with tolerance. Met, new goal of pt meeting >/=90% of estimated needs per kcal count assessment.  2. Wt/wt change; monitor trends. Ongoing.    Loyce Dys, MS RD LDN Clinical Inpatient Dietitian Pager: [phone] Weekend/After hours pager: [phone]

## 2012-05-20 LAB — CBC
MCH: 27.3 pg (ref 26.0–34.0)
MCHC: 33.7 g/dL (ref 30.0–36.0)
Platelets: 491 10*3/uL — ABNORMAL HIGH (ref 150–400)
RDW: 14.6 % (ref 11.5–15.5)

## 2012-05-20 MED ORDER — FLUCONAZOLE 200 MG PO TABS: 200.0000 mg | ORAL_TABLET | Freq: Every day | ORAL | Status: DC

## 2012-05-20 MED ORDER — CIPROFLOXACIN HCL 500 MG PO TABS: 500.0000 mg | ORAL_TABLET | Freq: Two times a day (BID) | ORAL | Status: DC

## 2012-05-20 NOTE — Progress Notes (Signed)
Nutrition Brief Note  Pt to d/c home today. Ticket available for kcal count shows pt ate well for breakfast.  Pt states that after RD visit yesterday, she focused on improving her intake and wanted to make sure she was getting enough to eat.  She also received additional snacks.  No further recommendations or interventions at this time.  Loyce Dys, MS RD LDN Clinical Inpatient Dietitian Pager: [phone] Weekend/After hours pager: [phone]

## 2012-05-20 NOTE — Clinical Social Work Note (Signed)
Clinical Social Worker received inappropriate referral for home health evaluation for patient. The CM will be assisting patient regarding home health needs. CSW will sign off as social work intervention is no longer needed.   Rozetta Nunnery MSW, [employer] [phone]

## 2012-05-20 NOTE — Progress Notes (Signed)
Discharge home.

## 2012-05-20 NOTE — Care Management Note (Signed)
  Page 1 of 1   05/20/2012     11:50:03 AM   CARE MANAGEMENT NOTE 05/20/2012  Patient:  Emma Munoz, Emma Munoz   Account Number:  [id number]  Date Initiated:  05/19/2012  Documentation initiated by:  Ronny Flurry  Subjective/Objective Assessment:     Action/Plan:   Anticipated DC Date:  05/20/2012   Anticipated DC Plan:  HOME W HOME HEALTH SERVICES         Choice offered to / List presented to:  C-1 Patient        HH arranged  HH-1 RN      [facility] agency  [facility]   Status of service:  Completed, signed off Medicare Important Message given?   (If response is "NO", the following Medicare IM given date fields will be blank) Date Medicare IM given:   Date Additional Medicare IM given:    Discharge Disposition:  HOME W HOME HEALTH SERVICES  Per UR Regulation:  Reviewed for med. necessity/level of care/duration of stay  If discussed at Long Length of Stay Meetings, dates discussed:    Comments:  05-19-12 MD plan :  1. S/p SBR done 04-25-12 , now with infected hematoma, s/p perc drain 05-14-12 Plan: 1. Cont abx for pansensitive Klebsiella 2. Repeat CT scan today 3. Cont to encourage po intake and ensure.

## 2012-05-20 NOTE — Discharge Summary (Signed)
Physician Discharge Summary  Patient ID: Emma Munoz MRN: [id number] DOB/AGE: [birth year] 44 y.o.  Admit date: 05/13/2012 Discharge date: 05/20/2012  Admission Diagnoses:  Discharge Diagnoses:  Principal Problem:  *Infected hematoma following procedure   Discharged Condition: good  Hospital Course: The patient was admitted with fevers.  A CT scan revealed an infected hematoma.  A drain was placed in this in IR.  The patient slowly began to get better on IV Zosyn.  Her drain culture grew Klebsiella, pansensitive.  A follow up CT was performed.  The fluid collection had not resolved but was getting smaller.  There was an enhancing pelvic fluid collection with no air.  Given her lack of fevers and no elevated wbc, I decided not to have this drained.  If she develops fevers again, we will have this re-evaluated.  She was monitored in the hospital on PO cipro and did not have any more fevers.  She will complete a 2 week course of cipro/  Consults: IR  Significant Diagnostic Studies: labs: CBC and CT scan and drain placement  Treatments: IV hydration, antibiotics: Zosyn and Cipro and analgesia: Morphine  Discharge Exam: Blood pressure 122/79, pulse 85, temperature 98.9 F (37.2 C), temperature source Oral, resp. rate 20, height 5\' 7"  (1.702 m), weight 199 lb 15.7 oz (90.71 kg), SpO2 93.00%. General appearance: alert and cooperative Cardio: regular rate and rhythm GI: soft, non-tender; bowel sounds normal; no masses,  no organomegaly Drain with dark bloody output  Disposition: Home with home health    Medication List     As of 05/20/2012 11:27 AM    TAKE these medications         ciprofloxacin 500 MG tablet   Commonly known as: CIPRO   Take 1 tablet (500 mg total) by mouth 2 (two) times daily.      cyclobenzaprine 10 MG tablet   Commonly known as: FLEXERIL   Take 20 mg by mouth at bedtime.      esomeprazole 40 MG capsule   Commonly known as: NEXIUM   Take 40 mg by mouth  daily.      fluconazole 200 MG tablet   Commonly known as: DIFLUCAN   Take 1 tablet (200 mg total) by mouth daily.      LINZESS 290 MCG Caps   Generic drug: Linaclotide   Take 290 mcg by mouth daily.      lisinopril 20 MG tablet   Commonly known as: PRINIVIL,ZESTRIL   Take 20 mg by mouth daily.      morphine 30 MG tablet   Commonly known as: MSIR   Take 3 tablets (90 mg total) by mouth every 4 (four) hours as needed. For breakthrough pain      morphine 100 MG 24 hr capsule   Commonly known as: KADIAN   Take 100 mg by mouth 2 (two) times daily.      ASK your doctor about these medications         metoprolol succinate 100 MG 24 hr tablet   Commonly known as: TOPROL-XL   Take 100 mg by mouth daily. Take with or immediately following a meal.         Signed: Drue Harr C. 05/20/2012, 11:27 AM

## 2012-05-28 ENCOUNTER — Encounter (INDEPENDENT_AMBULATORY_CARE_PROVIDER_SITE_OTHER): Payer: Self-pay | Admitting: General Surgery

## 2012-05-28 ENCOUNTER — Ambulatory Visit (INDEPENDENT_AMBULATORY_CARE_PROVIDER_SITE_OTHER): Payer: Medicare Other | Admitting: General Surgery

## 2012-05-28 VITALS — BP 128/86 | HR 93 | Temp 97.5°F | Ht 67.0 in | Wt 194.6 lb

## 2012-05-28 DIAGNOSIS — R188 Other ascites: Secondary | ICD-10-CM

## 2012-05-28 NOTE — Progress Notes (Signed)
Emma Munoz is a 46 y.o. female who is status post a SBR on 9/13.  This was complicated by a post-op hematoma and subsequent infection.  SHe is s/p drain placement.  Her drain puts our ~25cc every 2-3 days.  She has no fevers.  She has not regained her appetite.  She had one episode of nausea and vomiting yesterday.  Objective: Filed Vitals:   05/28/12 1434  BP: 128/86  Pulse: 93  Temp: 97.5 F (36.4 C)    General appearance: alert, cooperative and no distress GI: sof, drain with dark blood Incision healing well  Incision: healing well   Assessment: s/p  Patient Active Problem List  Diagnosis  . HYPERLIPIDEMIA  . METABOLIC SYNDROME X  . OBESITY  . ANEMIA-NOS  . TOBACCO ABUSE  . DEPRESSION  . REFLEX SYMPATHETIC DYSTROPHY  . HYPERTENSION  . ALLERGIC RHINITIS  . GERD  . GASTROPARESIS  . HERNIATED CERVICAL DISC  . INSOMNIA  . FATIGUE  . PARESTHESIA  . MIGRAINES, HX OF  . Diabetes mellitus type 2, uncontrolled  . Nausea  . Primary hyperparathyroidism  . Hypertension  . Preop cardiovascular exam  . Abdominal pain  . Open abdominal wall wound  . Fatigue  . Hypoparathyroidism  . Constipation  . IBS (irritable bowel syndrome)  . Infected hematoma following procedure      Plan: I encouraged her to consider a nutritional supplement.  We will get a CT scan to evaluate her abscess as well as any reason for her nausea.  We will pull her drain if the cavity has resolved.    Vanita Panda, MD [facility], [place] [phone]   05/28/2012 3:07 PM

## 2012-05-28 NOTE — Patient Instructions (Signed)
We will order a CT scan to evaluate your nausea and the abscess cavity.

## 2012-05-30 ENCOUNTER — Ambulatory Visit
Admission: RE | Admit: 2012-05-30 | Discharge: 2012-05-30 | Disposition: A | Discharge status: Home / Self Care | Payer: Medicare Other | Source: Ambulatory Visit | Attending: General Surgery | Admitting: General Surgery

## 2012-05-30 DIAGNOSIS — R188 Other ascites: Secondary | ICD-10-CM

## 2012-05-30 MED ORDER — IOHEXOL 300 MG/ML  SOLN
100.0000 mL | Freq: Once | INTRAMUSCULAR | Status: AC | PRN
  Administered 2012-05-30: 100 mL via INTRAVENOUS

## 2012-06-04 ENCOUNTER — Telehealth: Payer: Self-pay | Admitting: Internal Medicine

## 2012-06-04 NOTE — Telephone Encounter (Signed)
[place] [facility] [place] get a refill on her neixum. Pt is completely out Please advise pt when called

## 2012-06-05 ENCOUNTER — Encounter (INDEPENDENT_AMBULATORY_CARE_PROVIDER_SITE_OTHER): Payer: Medicare Other | Admitting: General Surgery

## 2012-06-06 ENCOUNTER — Telehealth: Payer: Self-pay | Admitting: Internal Medicine

## 2012-06-06 ENCOUNTER — Telehealth (INDEPENDENT_AMBULATORY_CARE_PROVIDER_SITE_OTHER): Payer: Self-pay

## 2012-06-06 ENCOUNTER — Telehealth (INDEPENDENT_AMBULATORY_CARE_PROVIDER_SITE_OTHER): Payer: Self-pay | Admitting: General Surgery

## 2012-06-06 ENCOUNTER — Other Ambulatory Visit: Payer: Self-pay | Admitting: Internal Medicine

## 2012-06-06 MED ORDER — ESOMEPRAZOLE MAGNESIUM 40 MG PO CPDR: 40.0000 mg | DELAYED_RELEASE_CAPSULE | Freq: Every day | ORAL | Status: DC

## 2012-06-06 NOTE — Telephone Encounter (Signed)
Dr Maisie Fus reviewed the ct and is calling the pt.

## 2012-06-06 NOTE — Telephone Encounter (Signed)
Pt calling with questions for Dr. Maisie Fus, and CT scan results.  Wanted to speak with Dr. Maisie Fus' nurse.

## 2012-06-06 NOTE — Telephone Encounter (Signed)
I called the patient and discussed her CT results.  She states that when she flushes her drain, the clear fluid just comes back out around the drain.  She has an apt to see me next Thursday.  We will take the drain out at that time.  It appears that she is eating better but she is still loosing weight.  I told her that I was Ok with the weight loss as long as she was getting good nutrition when she was eating.

## 2012-06-06 NOTE — Telephone Encounter (Signed)
Refill request for nexium 40 mg cap #30 Sig: take one capsule each morning 2nd fax

## 2012-06-06 NOTE — Telephone Encounter (Signed)
Call from patient states that she needs a refill of her Nexium and Miralax. The Nexium script has been sent to the pharmacy. Miralax is not on her med list and there is no supporting  documentation that Dr. Dan Humphreys prescribed. Called patient and she is aware that the Nexium was sent but that the Miralax had not and why. Patient  states that she will call the Gastroenterologist

## 2012-06-10 ENCOUNTER — Other Ambulatory Visit: Payer: Self-pay | Admitting: Internal Medicine

## 2012-06-10 ENCOUNTER — Telehealth (INDEPENDENT_AMBULATORY_CARE_PROVIDER_SITE_OTHER): Payer: Self-pay | Admitting: General Surgery

## 2012-06-10 MED ORDER — POLYETHYLENE GLYCOL 3350 17 GM/SCOOP PO POWD: 17.0000 g | Freq: Every day | ORAL | Status: DC

## 2012-06-10 NOTE — Telephone Encounter (Signed)
Looks like Rx was sent electronically to [facility] pharmacy on 06/06/2012 but I called it in just to be sure.

## 2012-06-10 NOTE — Telephone Encounter (Signed)
Patient called to try to get her drain out sooner than Thursday. I offered appt with Dr Maisie Fus today but she could not come until after 12:00 pm. She decided to keep her appt on Thursday.

## 2012-06-10 NOTE — Telephone Encounter (Signed)
Sent miralax to pt's pharamcy

## 2012-06-10 NOTE — Telephone Encounter (Signed)
Refill request for polyethylene glycol pow gm #527 Sig: mix 17 gm(1 capful) in 6 oz of water or juice and drink every day as directed

## 2012-06-11 ENCOUNTER — Other Ambulatory Visit: Payer: Self-pay | Admitting: Internal Medicine

## 2012-06-11 NOTE — Telephone Encounter (Signed)
Refill request for nexium 40 mg cap #30 ZOX:WRUE one capsule each morning

## 2012-06-11 NOTE — Telephone Encounter (Signed)
Rx was sent electronically on 06/06/2012.

## 2012-06-12 ENCOUNTER — Encounter (INDEPENDENT_AMBULATORY_CARE_PROVIDER_SITE_OTHER): Payer: Self-pay | Admitting: General Surgery

## 2012-06-12 ENCOUNTER — Ambulatory Visit (INDEPENDENT_AMBULATORY_CARE_PROVIDER_SITE_OTHER): Payer: Medicare Other | Admitting: General Surgery

## 2012-06-12 VITALS — BP 122/80 | HR 71 | Temp 98.4°F | Resp 16 | Ht 67.0 in | Wt 193.6 lb

## 2012-06-12 DIAGNOSIS — K651 Peritoneal abscess: Secondary | ICD-10-CM

## 2012-06-12 DIAGNOSIS — IMO0002 Reserved for concepts with insufficient information to code with codable children: Secondary | ICD-10-CM

## 2012-06-12 NOTE — Progress Notes (Signed)
Emma Munoz is a 46 y.o. female who is status post a SBR on 9/15.  This was complicated by a mesenteric hematoma, which subsequently got infected.  She is s/p drain placement.  Her abscess has almost completely resolved by CT.  Her drain has stopped having output and does not flush well.  She is eating well.  Her weight loss is stabilizing.    Objective: Filed Vitals:   06/12/12 1420  BP: 122/80  Pulse: 71  Temp: 98.4 F (36.9 C)  Resp: 16    General appearance: alert and no distress GI: soft, non-tender; bowel sounds normal; no masses,  no organomegaly drain with scant amt of old hematoma  Incision: healing well   Assessment: s/p  Patient Active Problem List  Diagnosis  . HYPERLIPIDEMIA  . METABOLIC SYNDROME X  . OBESITY  . ANEMIA-NOS  . TOBACCO ABUSE  . DEPRESSION  . REFLEX SYMPATHETIC DYSTROPHY  . HYPERTENSION  . ALLERGIC RHINITIS  . GERD  . GASTROPARESIS  . HERNIATED CERVICAL DISC  . INSOMNIA  . FATIGUE  . PARESTHESIA  . MIGRAINES, HX OF  . Diabetes mellitus type 2, uncontrolled  . Nausea  . Primary hyperparathyroidism  . Hypertension  . Preop cardiovascular exam  . Abdominal pain  . Open abdominal wall wound  . Fatigue  . Hypoparathyroidism  . Constipation  . IBS (irritable bowel syndrome)  . Infected hematoma following procedure      Plan: Drain removed RTC PRN    .Vanita Panda, MD [facility], [place] [phone]   06/12/2012 2:47 PM

## 2012-06-12 NOTE — Patient Instructions (Signed)
Follow up as needed

## 2012-07-02 ENCOUNTER — Telehealth: Payer: Self-pay | Admitting: Internal Medicine

## 2012-07-02 MED ORDER — METOPROLOL TARTRATE 100 MG PO TABS: 100.0000 mg | ORAL_TABLET | Freq: Every day | ORAL | Status: DC

## 2012-07-02 NOTE — Telephone Encounter (Signed)
We should have her bring in the bottle to confirm, because in our records we have Toprol XL.

## 2012-07-02 NOTE — Telephone Encounter (Signed)
Dr. Dan Humphreys,  Patient needs refills on metoprolol succinate 100 mg and lisinopril 20 mg. Pharmacy sent request to [place] , but there asking for metoprolol tartrate 100 mg The patient doesn't know which one she is taking;please advise

## 2012-07-02 NOTE — Telephone Encounter (Signed)
[place] [facility] Lisinopril 20mg  tab #30 Take one tablet by mouth every day  Metoprolol tartrate 100mg  tab #30 Take one tablet by mouth every day

## 2012-07-02 NOTE — Telephone Encounter (Signed)
Pharmacy called and stated that patient  is taking Lopressor  100 mg prescribed by physician she saw at [facility].

## 2012-07-02 NOTE — Telephone Encounter (Signed)
Fine to fill then, and we should change in her chart.

## 2012-07-02 NOTE — Telephone Encounter (Signed)
Med filled.  

## 2012-07-09 ENCOUNTER — Other Ambulatory Visit: Payer: Self-pay | Admitting: General Practice

## 2012-07-09 MED ORDER — LISINOPRIL 20 MG PO TABS: 20.0000 mg | ORAL_TABLET | Freq: Every day | ORAL | Status: DC

## 2012-07-09 NOTE — Telephone Encounter (Signed)
Med filled.  

## 2012-07-14 ENCOUNTER — Ambulatory Visit: Payer: Medicare Other | Admitting: Internal Medicine

## 2012-07-15 ENCOUNTER — Ambulatory Visit (INDEPENDENT_AMBULATORY_CARE_PROVIDER_SITE_OTHER): Payer: Medicare Other | Admitting: General Surgery

## 2012-07-15 ENCOUNTER — Encounter (INDEPENDENT_AMBULATORY_CARE_PROVIDER_SITE_OTHER): Payer: Self-pay | Admitting: General Surgery

## 2012-07-15 VITALS — BP 120/80 | HR 102 | Temp 97.9°F | Resp 18 | Ht 68.0 in | Wt 197.0 lb

## 2012-07-15 DIAGNOSIS — R1013 Epigastric pain: Secondary | ICD-10-CM

## 2012-07-15 DIAGNOSIS — R109 Unspecified abdominal pain: Secondary | ICD-10-CM

## 2012-07-15 MED ORDER — DICYCLOMINE HCL 10 MG PO CAPS: 10.0000 mg | ORAL_CAPSULE | Freq: Three times a day (TID) | ORAL | Status: DC

## 2012-07-15 NOTE — Progress Notes (Signed)
Emma Munoz is a 46 y.o. female who is here for a follow up visit regarding increased upper abd pain.  This is like her other pain prior to surgery, but it is random and not after meals.  Her BM's are stable.  She is not having any bleeding.  Objective: Filed Vitals:   07/15/12 1125  BP: 120/80  Pulse: 102  Temp: 97.9 F (36.6 C)  Resp: 18    General appearance: alert and cooperative GI: soft, no palpable hernia, tender at midepigastric region, incision healing well   Assessment and Plan: Intestinal spasms- will try a course of Bentyl CT enterogpraphy to evaluate for anastomotic stricture    .Vanita Panda, MD [facility], [place] [phone]

## 2012-07-15 NOTE — Patient Instructions (Signed)
Try Bentyl for abd pain.  We will schedule CT enterography to evaluate for recurrent stricture

## 2012-07-16 ENCOUNTER — Ambulatory Visit (HOSPITAL_COMMUNITY)
Admission: RE | Admit: 2012-07-16 | Discharge: 2012-07-16 | Disposition: A | Discharge status: Home / Self Care | Payer: Medicare Other | Source: Ambulatory Visit | Attending: General Surgery | Admitting: General Surgery

## 2012-07-16 DIAGNOSIS — K7689 Other specified diseases of liver: Secondary | ICD-10-CM | POA: Insufficient documentation

## 2012-07-16 DIAGNOSIS — R109 Unspecified abdominal pain: Secondary | ICD-10-CM | POA: Insufficient documentation

## 2012-07-16 MED ORDER — IOHEXOL 300 MG/ML  SOLN
100.0000 mL | Freq: Once | INTRAMUSCULAR | Status: AC | PRN
  Administered 2012-07-16: 100 mL via INTRAVENOUS

## 2012-07-17 ENCOUNTER — Ambulatory Visit: Payer: Medicare Other | Admitting: Internal Medicine

## 2012-07-17 ENCOUNTER — Telehealth (INDEPENDENT_AMBULATORY_CARE_PROVIDER_SITE_OTHER): Payer: Self-pay

## 2012-07-17 NOTE — Telephone Encounter (Signed)
I called and relayed Dr Maisie Fus' message to the pt.  The pt said she was constipated but she has had a bowel movement and feels relief.  She is less bloated.  She feels relieved that the ct was ok.  I gave her an appointment for 12/30

## 2012-07-17 NOTE — Telephone Encounter (Signed)
Message copied by Ivory Broad on Thu Jul 17, 2012  9:26 AM ------      Message from: Baden, Broadus John      Created: Thu Jul 17, 2012  9:07 AM      Regarding: CT results       It looks like her anastomosis is open, but she does have some dilated loops of small bowel in her upper abdomen.  It looks like the contrast passes through them, but this is probably the cause of her discomfort.  It is most likely caused by scar tissue.  This tissue should soften as time passes.  Nothing to do but wait and watch for now, as more surgery would just create more scar tissue.  It also appears that she is rather constipated.  This may be contributing to her cramping.  Would recommend fiber supplement and stool softeners together.  Miralax daily if that doesn't help or she is already on those.  Please have her come back in 3-4 weeks for re-evaluation.              Thanks,      AT

## 2012-08-11 ENCOUNTER — Encounter (INDEPENDENT_AMBULATORY_CARE_PROVIDER_SITE_OTHER): Payer: Medicare Other | Admitting: General Surgery

## 2012-08-12 ENCOUNTER — Encounter (INDEPENDENT_AMBULATORY_CARE_PROVIDER_SITE_OTHER): Payer: Self-pay | Admitting: General Surgery

## 2012-08-12 ENCOUNTER — Ambulatory Visit (INDEPENDENT_AMBULATORY_CARE_PROVIDER_SITE_OTHER): Payer: Medicare Other | Admitting: General Surgery

## 2012-08-12 VITALS — BP 148/84 | HR 72 | Temp 98.6°F | Resp 18 | Ht 67.0 in | Wt 200.5 lb

## 2012-08-12 DIAGNOSIS — R112 Nausea with vomiting, unspecified: Secondary | ICD-10-CM

## 2012-08-12 NOTE — Patient Instructions (Signed)
Use a dulcolax suppository if you haven't had a bowel movement in 2 days   We will schedule an apt with Dr Rhea Belton as well

## 2012-08-12 NOTE — Progress Notes (Signed)
Emma Munoz is a 46 y.o. female who is here for a follow up visit regarding her nausea, vomiting and abdominal bloating.  She is s/p a SBR for an anastomotic stricture.  The was complicated by some post operative bleeding and a hematoma, which subsequently got infected.  This resolved but now she is having some bloating and nausea.  Spicey foods and sauces seem to cause this as well as large meals.  A CT enterography showed that her anastomosis was open but she did have some dilated loops around the old hematoma site that I presume is due to scar tissue.  She is still having nausea.  This occurs daily.  She vomits about twice a week.  She is also going several days without bowel movements, but denies hard stools or having to strain.  Objective: Filed Vitals:   08/12/12 1154  BP: 148/84  Pulse: 72  Temp: 98.6 F (37 C)  Resp: 18    General appearance: alert and cooperative GI: normal findings: soft, non-tender and abnormal findings:  distended   Assessment and Plan: I think [name] has several GI problems.  She definitely has constipation that is most likely related to her narcotic use.  She is working to wean off of these.  She is taking fiber daily.  She doesn't get results with miralax and has nausea with mag citrate.  I have recommended suppositories to help with this.  Given her symptoms of nausea and vomiting with specific foods and early satiety, I am going to have her see Dr Rhea Belton again and make sure we are not missing any gastric issues that could be contributing to her symptoms.  I will see her back in 4 weeks.    Vanita Panda, MD [facility], [place] [phone]

## 2012-08-15 ENCOUNTER — Ambulatory Visit (INDEPENDENT_AMBULATORY_CARE_PROVIDER_SITE_OTHER): Payer: Medicare Other | Admitting: Internal Medicine

## 2012-08-15 ENCOUNTER — Telehealth: Payer: Self-pay | Admitting: Gastroenterology

## 2012-08-15 ENCOUNTER — Other Ambulatory Visit (INDEPENDENT_AMBULATORY_CARE_PROVIDER_SITE_OTHER): Payer: Medicare Other

## 2012-08-15 ENCOUNTER — Encounter: Payer: Self-pay | Admitting: Internal Medicine

## 2012-08-15 VITALS — BP 138/90 | HR 73 | Ht 67.0 in | Wt 197.2 lb

## 2012-08-15 DIAGNOSIS — R1110 Vomiting, unspecified: Secondary | ICD-10-CM

## 2012-08-15 DIAGNOSIS — R109 Unspecified abdominal pain: Secondary | ICD-10-CM

## 2012-08-15 DIAGNOSIS — K5900 Constipation, unspecified: Secondary | ICD-10-CM

## 2012-08-15 DIAGNOSIS — R110 Nausea: Secondary | ICD-10-CM

## 2012-08-15 LAB — BASIC METABOLIC PANEL
Calcium: 10.4 mg/dL (ref 8.4–10.5)
GFR: 104.29 mL/min (ref >60.00)
Potassium: 4.4 mEq/L (ref 3.5–5.1)
Sodium: 137 mEq/L (ref 135–145)

## 2012-08-15 LAB — CALCIUM, IONIZED: Calcium, Ion: 1.48 mmol/L — ABNORMAL HIGH (ref 1.12–1.32)

## 2012-08-15 MED ORDER — ALIGN PO CAPS: 1.0000 | ORAL_CAPSULE | Freq: Every day | ORAL | Status: DC

## 2012-08-15 MED ORDER — ESOMEPRAZOLE MAGNESIUM 40 MG PO PACK: 40.0000 mg | PACK | Freq: Every day | ORAL | Status: DC

## 2012-08-15 MED ORDER — ONDANSETRON HCL 4 MG PO TABS: 4.0000 mg | ORAL_TABLET | Freq: Three times a day (TID) | ORAL | Status: DC | PRN

## 2012-08-15 MED ORDER — DICYCLOMINE HCL 10 MG PO CAPS: 10.0000 mg | ORAL_CAPSULE | Freq: Three times a day (TID) | ORAL | Status: DC

## 2012-08-15 MED ORDER — POLYETHYLENE GLYCOL 3350 17 GM/SCOOP PO POWD: 17.0000 g | Freq: Every day | ORAL | Status: DC

## 2012-08-15 MED ORDER — LINACLOTIDE 290 MCG PO CAPS: 290.0000 ug | ORAL_CAPSULE | Freq: Every day | ORAL | Status: DC

## 2012-08-15 NOTE — Patient Instructions (Addendum)
Your physician has requested that you go to the basement for lab work before leaving today.  Continue taking Linzess daily for constipation and Bentyl for abdominal cramping and spasms.  We have sent the following medications to your pharmacy for you to pick up at your convenience: Nexium,, zofran please take medications as directed.  We have given you samples of Align. This puts good bacteria back into your intestines. You should take 1 capsule by mouth once daily. If this works well for you, it can be purchased over the counter.   Follow up with dr. Demetrius Charity yrtle in office in 8 weeks   You will hear from [place] regarding your referral to Dr. Marrion Coy.

## 2012-08-15 NOTE — Telephone Encounter (Signed)
lvm for pt to call me back regarding her referral to Dr. Carlena Sax Endocrinology; per her assistant, she will need to speak to the billing department first.

## 2012-08-15 NOTE — Progress Notes (Signed)
Patient ID: Emma Munoz, female   DOB: [birth year], 47 y.o.   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 She was last seen on 04/02/2012. After this she met with Dr. Maisie Fus who performed a small bowel resection to take down her previous anastomotic stricture. The initial surgery was uneventful, but was complicated by postoperative hematoma/fluid collection requiring percutaneous drain placement. She was hospitalized for a few days after drain placement, and the drain was removed about 3 weeks later.  She's recently had a CT enterography which showed no further small bowel stricture, but a persistent subcutaneous collection of fluid felt to be postsurgical but not infected.  Overall she reports she is a whole lot better after surgery but still "not quite well".  She is no longer having obstructive symptoms including severe pain with nausea and vomiting. She is still occasionally getting nausea and abdominal cramping after eating. She knows this is worse with spicy and greasy foods. She is having abdominal cramping 2-3 days per week and this has been somewhat unpredictable. It is episodic and not constant. She's having some nausea with these episodes and occasional vomiting. She reports vomiting approximately once per week. Her constipation continues to be an issue. She continues on LINZESS 290 mcg daily. Initially this medication worked well for her constipation, but since it has seemed to lose its efficacy.  She reports she has been using MiraLAX in addition to the [facility] but when she take 17 g daily her stools are too loose, and mostly pure liquid. If she misses a day or takes MiraLAX every other  day she feels too constipated. She does have heartburn which is mostly controlled on Nexium daily. She estimates she breakthrough heartburn one day per week. No dysphagia or odynophagia. No blood in her stool. No hematemesis.  She remains on chronic narcotics and she is working with her pain medicine physician to wean off the long-acting morphine. Winter months seem to be worse for her pain, and they are not planning to wean this drug further until summertime.  She has not recently seen endocrinology regarding her hypercalcemia. This issue was put on hold as she dealt with her partial small bowel obstructions and subsequent bowel surgery.  Review of Systems  As per history of present illness, otherwise negative   Past Medical History  Diagnosis Date  . Hypertension   . IBS (irritable bowel syndrome)   . Constipation   . Hyperthyroidism   . Heart murmur   . Hyperglycemia   . Tobacco abuse   . Type II diabetes mellitus     for 6 weeks only 09/2011-11/2011    Current Outpatient Prescriptions  Medication Sig Dispense Refill  . cyclobenzaprine (FLEXERIL) 10 MG tablet Take 20 mg by mouth at bedtime.      . dicyclomine (BENTYL) 10 MG capsule Take 1 capsule (10 mg total) by mouth 3 (three) times daily before meals.  50 capsule  0  . Linaclotide (LINZESS) 290 MCG CAPS Take 290 mcg by mouth daily.  30 capsule  6  . lisinopril (PRINIVIL,ZESTRIL) 20 MG tablet Take 1 tablet (20 mg total) by mouth daily.  30 tablet  4  . metoprolol (LOPRESSOR) 100 MG tablet Take 1 tablet (100 mg total) by  mouth daily.  30 tablet  6  . morphine (KADIAN) 100 MG 24 hr capsule Take 100 mg by mouth 2 (two) times daily.       Marland Kitchen morphine (MSIR) 30 MG tablet Take 3 tablets (90 mg total) by mouth every 4 (four) hours as needed. For breakthrough pain  120 tablet  0  . polyethylene glycol powder (GLYCOLAX/MIRALAX) powder Take 17 g by mouth daily.  255 g  3  . bifidobacterium infantis (ALIGN) capsule Take 1 capsule by mouth daily.   14 capsule  0  . esomeprazole (NEXIUM) 40 MG packet Take 40 mg by mouth daily before breakfast.  30 each  12  . ondansetron (ZOFRAN) 4 MG tablet Take 1 tablet (4 mg total) by mouth every 8 (eight) hours as needed for nausea.  20 tablet  0    Allergies  Allergen Reactions  . Aspirin Anaphylaxis    REACTION: Tongue, throat, extremities swell  . Dilaudid (Hydromorphone Hcl) Nausea And Vomiting  . Ibuprofen Anaphylaxis    REACTION: Tongue, throat, extremities swell  . Latex Hives  . Nsaids Anaphylaxis    REACTION: Tongue, throat, extremities swell  . Gadolinium Derivatives Nausea And Vomiting and Other (See Comments)    Pt states nausea and vomiting to GAD in MRI pt states she does ok with iv/cm in cat scan    Family History  Problem Relation Age of Onset  . Early death Father 23    GUNSHOT  . Kidney disease Brother     s/p transplant  . Heart disease Maternal Grandmother     Heart defect  . Prostate cancer Maternal Grandfather   . Cancer Maternal Grandfather     prostate    History  Substance Use Topics  . Smoking status: Current Every Day Smoker -- 1.0 packs/day for 24 years    Types: Cigarettes  . Smokeless tobacco: Never Used  . Alcohol Use: No     Comment: Rarely    OBJECTIVE: BP 138/90  Pulse 73  Ht 5\' 7"  (1.702 m)  Wt 197 lb 3.2 oz (89.449 kg)  BMI 30.89 kg/m2 BP 138/90  Pulse 73  Ht 5\' 7"  (1.702 m)  Wt 197 lb 3.2 oz (89.449 kg)  BMI 30.89 kg/m2 Constitutional: Well-developed and well-nourished. No distress. HEENT: Normocephalic and atraumatic. Oropharynx is clear and moist. No oropharyngeal exudate. Conjunctivae are normal.  No scleral icterus. Cardiovascular: Normal rate, regular rhythm and intact distal pulses. No M/R/G Pulmonary/chest: Effort normal and breath sounds normal. No wheezing, rales or rhonchi. Abdominal: Soft, central mild tenderness without rebound or guarding, nondistended. Abdominal scar is well healed. Bowel sounds active throughout.  There are no masses palpable.  Extremities: no clubbing, cyanosis, or edema Neurological: Alert and oriented to person place and time. Skin: Skin is warm and dry. No rashes noted. Psychiatric: Normal mood and affect. Behavior is normal.  Labs and Imaging -- CBC    Component Value Date/Time   WBC 8.9 05/20/2012 0626   RBC 3.85* 05/20/2012 0626   HGB 10.5* 05/20/2012 0626   HCT 31.2* 05/20/2012 0626   PLT 491* 05/20/2012 0626   MCV 81.0 05/20/2012 0626   MCH 27.3 05/20/2012 0626   MCHC 33.7 05/20/2012 0626   RDW 14.6 05/20/2012 0626   LYMPHSABS 1.8 05/13/2012 1600   MONOABS 0.8 05/13/2012 1600   EOSABS 0.1 05/13/2012 1600   BASOSABS 0.0 05/13/2012 1600    CMP     Component Value Date/Time   NA 130* 05/17/2012 0730  K 4.3 05/17/2012 0730   CL 98 05/17/2012 0730   CO2 24 05/17/2012 0730   GLUCOSE 102* 05/17/2012 0730   BUN 7 05/17/2012 0730   CREATININE 0.61 05/17/2012 0730   CREATININE 0.62 04/16/2012 1419   CALCIUM 11.7* 05/17/2012 0730   CALCIUM 11.0* 12/21/2011 1432   PROT 7.5 05/13/2012 1600   ALBUMIN 3.1* 05/13/2012 1600   AST 10 05/13/2012 1600   ALT 8 05/13/2012 1600   ALKPHOS 99 05/13/2012 1600   BILITOT 0.5 05/13/2012 1600   GFRNONAA >90 05/17/2012 0730   GFRAA >90 05/17/2012 0730   ASSESSMENT AND PLAN:  47 yo female with a somewhat complex PMH including diabetes, hypertension, GERD, IBS-constipation, chronic pain due to a right hand injury on chronic narcotics, hyperparathyroidism with hypercalcemia, and 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  1.  Intermittent bloating/nausea/abdominal cramping/constipation -- the patient is improved after her partial small bowel resection. I feel that some of her symptoms are definitely worsened by her chronic narcotic use which likely slows both her gastric and bowel motility.  I am happy that she plans to wean off the chronic are Copaxone feel that she will have less abdominal issues  including constipation with these medications are stopped. 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 I also expect constipation continues to play a part in her symptomatology. For now I like for her to continue LINZESS 290 mcg daily. When she uses 17 g of MiraLAX daily her stools are too loose, and therefore I will recommend MiraLAX 8 mg daily. I would like for her to use Bentyl on an as-needed basis for abdominal cramping/spasm. She has been avoiding this over concern of slowing her gut motility too much, but I think it is a safe option for spasm/pain on an intermittent basis.  I also will add align as a probiotic to help with her digestion as well as bloating. She's given a prescription for Zofran to be used as needed for nausea. My suspicion for gastritis or ulcer disease is low and she will continue Nexium which is working for heartburn.  If half dose MiraLAX does not work, we can try additional laxatives. Family also think hypercalcemia is contributing to her GI symptoms and slow gut motility. I've encouraged her to be seen again by endocrinologist, as this problem has been put on hold as she has worked through her bowel obstructions. She will be referred to Dr. Tedd Sias, she is seen recently for hypercalcemia.  2.  Hypercalcemia -- felt be contributing to slow bowel motility and constipation. Endocrine referral, see #1.  I will check calcium and ionized calcium today  3.  GERD -- the most part controlled on Nexium 40 mg daily, she will continue this dose  I will see her back in 8 weeks, sooner if necessary

## 2012-08-25 ENCOUNTER — Ambulatory Visit: Payer: Medicare Other | Admitting: Internal Medicine

## 2012-09-02 ENCOUNTER — Telehealth: Payer: Self-pay | Admitting: Gastroenterology

## 2012-09-02 NOTE — Telephone Encounter (Signed)
Left another vm for pt to call me back regarding her Referral to Endocrinology

## 2012-09-05 ENCOUNTER — Telehealth: Payer: Self-pay | Admitting: Internal Medicine

## 2012-09-05 NOTE — Telephone Encounter (Signed)
Patient Information:  Caller Name: Seren  Phone: [phone]  Patient: Emma Munoz, Emma Munoz  Gender: Female  DOB: May 12, 1966  Age: 47 Years  PCP: Ronna Polio (Adults only)  Pregnant: No  Office Follow Up:  Does the office need to follow up with this patient?: Yes  Instructions For The Office: Concern for patient with already FLU TWO WEEKS PRIOR. now exposed to Influenza B and having symptoms. Tamiflu called in for patient. PLEASE HAVE DR. Dan Humphreys REVIEW.  home care instructions and call back parameters reviewed with patient.  RN Note:  Tamiflu- 75mg  po Bid x 5 days. NR, Dr. Tyler Aas MD.  [phone]. Medication ready in 10 mintues  Home care instructions along with call back parameters reviewed. Advised caution and to closely mointor her s/sx of the flu.  Understanding expressed.  Symptoms  Reason For Call & Symptoms: Patient states that her daughter and four children have moved in with her. She states that they came down with the flu two weeks ago. Influenza A.  She got the flu as well. They  all got better and the kids became sick three days 09/02/12. They (the children)  went back to the physician this morning and the chidren now have Influenza B flu.  She became last night .  Flu symptoms- sneezing, +coughing non productive, head congestion-brown yellow. +headache,  coughing keeping her awake.  Right ear pain.  She did have flu shot.  Requesting Tamiflu.  She did not take Tamiflu two weeks ago.  Reviewed Health History In EMR: Yes  Reviewed Medications In EMR: Yes  Reviewed Allergies In EMR: Yes  Reviewed Surgeries / Procedures: No  Date of Onset of Symptoms: 09/04/2012  Treatments Tried: Dayquil  Treatments Tried Worked: No OB / GYN:  LMP: Unknown  Guideline(s) Used:  Influenza - Seasonal  Disposition Per Guideline:   Discuss with PCP and Callback by Nurse Today  Reason For Disposition Reached:   Patient requests antiviral medicine for  influenza and flu symptoms present < 48 hours  Advice Given:  Reassurance  For most healthy adults, influenza feels like a bad cold. The dangers of influenza for normal, healthy people (under 9 years of age) are overrated.  The treatment of influenza depends on your main symptoms. Generally, treatment is the same as for other viral respiratory infections (colds). Bed rest is unnecessary.  Here is some care advice that should help.  No Aspirin  : Do not use aspirin for treatment of fever or pain (Reason: there is an association between influenza and Reye syndrome).  Treating the Symptoms of Flu  Fever, Muscle Aches, and Headache: For fever more than 101 F (38.3 C), muscle aches, and headaches, take acetaminophen every 4-6 hours (Adults 650 mg) OR ibuprofen every 6-8 hours (Adults 400-600 mg).  Sore Throat: Use throat lozenges, hard candy or warm chicken broth.  Cough: Use cough drops.  Hydrate: Drink extra liquids. If the air in your home is dry, use a humidifier.  Isolation is Needed Until After the Fever is Gone:   The CDC recommends that people with influenza-like illness remain at home until at least 24 hours after they are free of fever (100 F or 37.8C).  Do NOT go to work or school.  Do NOT go to church, child care centers, shopping, or other public places.  Do NOT shake hands.  Avoid close contact with others (hugging, kissing).  Expected Course  : The fever lasts 2-3 days, the  runny nose 5-10 days, and the cough 2-3 weeks.  Call Back If:  Fever lasts more than 3 days  Runny nose lasts more than 10 days  Cough lasts more than 3 weeks  You become short of breath or worse.  For a Stuffy Nose - Use Nasal Washes:  Introduction: Saline (salt water) nasal irrigation (nasal wash) is an effective and simple home remedy for treating stuffy nose and sinus congestion. The nose can be irrigated by pouring, spraying, or squirting salt water into the nose and then letting it run back out.   How it Helps: The salt water rinses out excess mucus, washes out any irritants (dust, allergens) that might be present, and moistens the nasal cavity.  How to Make Saline [facility] Water) Nasal Wash :  You can make your own saline nasal wash.  Add 1/2 tsp of table salt to 1 cup (8 oz; 240 ml) of warm water.  You should use sterile, distilled, or previously boiled water for nasal irrigation.  Coughing Spasms  Drink warm fluids. Inhale warm mist (Reason: both relax the airway and loosen up the phlegm).  Suck on cough drops or hard candy to coat the irritated throat.  For all Fevers  Drink cold fluids to prevent dehydration.  Dress in 1 layer of lightweight clothing and sleep with 1 light blanket.  For fevers less than 101 F (38.3 C), fever medicines are usually not necessary.  Pain and Fever Medicines:  For pain or fever relief, take either acetaminophen or ibuprofen.  They are over-the-counter (OTC) drugs that help treat both fever and pain. You can buy them at the drugstore.  Treat fevers above 101 F (38.3 C). The goal of fever therapy is to bring the fever down to a comfortable level. Remember that fever medicine usually lowers fever 2 degrees F (1 - 1 1/2 degrees C).  Acetaminophen (e.g., Tylenol):  Regular Strength Tylenol: Take 650 mg (two 325 mg pills) by mouth every 4-6 hours as needed. Each Regular Strength Tylenol pill has 325 mg of acetaminophen.

## 2012-09-09 ENCOUNTER — Encounter (INDEPENDENT_AMBULATORY_CARE_PROVIDER_SITE_OTHER): Payer: Self-pay | Admitting: General Surgery

## 2012-09-09 ENCOUNTER — Ambulatory Visit (INDEPENDENT_AMBULATORY_CARE_PROVIDER_SITE_OTHER): Payer: Medicare Other | Admitting: General Surgery

## 2012-09-09 VITALS — BP 160/100 | HR 86 | Temp 97.0°F | Resp 18 | Ht 67.0 in | Wt 205.0 lb

## 2012-09-09 DIAGNOSIS — R109 Unspecified abdominal pain: Secondary | ICD-10-CM

## 2012-09-09 NOTE — Patient Instructions (Signed)
Continue your bowel regimen.  Try to eat a high fiber diet as possible.  Follow up with an endocrinologist.  Return to the office in 2-3 months

## 2012-09-09 NOTE — Progress Notes (Signed)
Emma Munoz is a 47 y.o. female who is here for a follow up visit regarding he abd pain.  She is having cramping after eating about 50% of the time.  Her weight is stable, and she is having a BM qod.    Objective: Filed Vitals:   09/09/12 1202  BP: 160/100  Pulse: 86  Temp: 97 F (36.1 C)  Resp: 18    General appearance: alert, cooperative and no distress GI: normal findings: soft, non-tender and no hernia   Assessment and Plan: Emma Munoz is s/p bowel resection for small bowel stenosis.  Her  obstructive symptoms have since resolved, but she is still having abd cramping and constipation.  She is working with Dr Rhea Belton on this.  I have recommended that she see an endocrinologist about her hypercalcemia.  Dr Pyrtle's office appears to be working on the referral.  I will see her back in 2-3 months to see how things are going.     Vanita Panda, MD [facility], [place] [phone]

## 2012-10-07 ENCOUNTER — Telehealth: Payer: Self-pay | Admitting: Internal Medicine

## 2012-10-07 NOTE — Telephone Encounter (Signed)
Pt was to be referred to Dr Tedd Sias at [facility] in [place], [place] for hypercalcemia, but pt reports she never heard anything. Called Dr Pricilla Handler ofc and was told pt has an outstanding bill and pt will need to call Billing at [phone]. Informed pt who stated she was never informed of this. She will call in the am since it's so late and I will ask Dr Rhea Belton if pt needs to be seen or just have labs since her calcium hasn't been checked since 08/15/12. Pt stated understanding. Informed her she will have to go to the ER if she worsens.

## 2012-10-08 ENCOUNTER — Ambulatory Visit: Payer: Medicare Other | Admitting: Nurse Practitioner

## 2012-10-08 NOTE — Telephone Encounter (Signed)
Pt reports she feels some better today; sounds like she just woke up. She has not called [facility] yet, but states she will. She will come in and see Willette Cluster, NP to at least be assessed and have a Calcium level drawn. She will let me know when she straightens out her [facility] bill or informed her we can try an Endocrinologist here. Pt will see Gunnar Fusi today at 3pm.

## 2012-10-08 NOTE — Telephone Encounter (Signed)
If she still feels poorly today, then she can be seen by advanced practice provider If necessary we can place a local endocrinology referral [facility]) for hypocalcemia.  I do not want an outstanding bill to prevent her from being evaluated. Thanks

## 2012-10-09 ENCOUNTER — Encounter: Payer: Self-pay | Admitting: *Deleted

## 2012-10-10 ENCOUNTER — Ambulatory Visit (INDEPENDENT_AMBULATORY_CARE_PROVIDER_SITE_OTHER): Payer: Medicare Other | Admitting: Nurse Practitioner

## 2012-10-10 ENCOUNTER — Other Ambulatory Visit (INDEPENDENT_AMBULATORY_CARE_PROVIDER_SITE_OTHER): Payer: Medicare Other

## 2012-10-10 ENCOUNTER — Ambulatory Visit (INDEPENDENT_AMBULATORY_CARE_PROVIDER_SITE_OTHER)
Admission: RE | Admit: 2012-10-10 | Discharge: 2012-10-10 | Disposition: A | Discharge status: Home / Self Care | Payer: Medicare Other | Source: Ambulatory Visit | Attending: Nurse Practitioner | Admitting: Nurse Practitioner

## 2012-10-10 ENCOUNTER — Encounter: Payer: Self-pay | Admitting: Nurse Practitioner

## 2012-10-10 VITALS — BP 160/100 | HR 84 | Ht 67.0 in | Wt 206.0 lb

## 2012-10-10 DIAGNOSIS — K5900 Constipation, unspecified: Secondary | ICD-10-CM

## 2012-10-10 MED ORDER — PROMETHAZINE HCL 12.5 MG PO TABS: ORAL_TABLET | ORAL | Status: DC

## 2012-10-10 NOTE — Progress Notes (Addendum)
10/10/2012 Emma Munoz [id number] February 22, 1966   History of Present Illness:  Patient is a 47 year old female with a history of recurrent small bowel obstructions. Emma Munoz's problems began last year when she sustained a small bowel injury during cholecystectomy. Her small bowel was resected April 2013. Following that, Emma Munoz continued to have intermittent obstructive type symptoms. She was hospitalized in August of this year at which time a small bowel follow through showed a short segment of luminal narrowing of the terminal small bowel suspicious for a stricture. Patient subsequently underwent another small bowel resection and an incisional hernia repair by Dr. Romie Levee at [facility]. Unfortunately patient has continued with post-prandial abdomdinal cramping and nausea. Symptoms are becoming progressively worse. She is mainly consuming liquids. Last week she was very distended with nausea, vomiting, diarrhea and constipation. She has to eat with her twice daily Kadian. Tries to eat toast with the medication but it can be difficult to even keep that down. She is rightly concerned about withdrawls from pain medications.   Current Medications, Allergies, Past Medical History, Past Surgical History, Family History and Social History were reviewed in [employer] record.   Physical Exam: General: Well developed , black female in no acute distress Head: Normocephalic and atraumatic Eyes:  sclerae anicteric, conjunctiva pink  Ears: Normal auditory acuity Lungs: Clear throughout to auscultation Heart: Regular rate and rhythm Abdomen: Soft, non tender and non distended. No masses, no hepatomegaly. Normal bowel sounds Musculoskeletal: Symmetrical with no gross deformities  Extremities: No edema  Neurological: Alert oriented x 4, grossly nonfocal Psychological:  Alert and cooperative. Normal mood and affect  Assessment and Recommendations:  1. History of partial SBO. Patient is s/p  small bowel resection for stricture September 2013. She still has post-prandial abdominal cramps with nausea and intermittent vomiting. On exam patient doesn't appear obstructed though her symptoms suggests either intermittent low grade obstruction or motility disturbance (she has hypercalcemia). Will check a flat and upright of the abdomen today. Continue prn Zofran. Trial of phenergan at night for breakthrough nausea.   We will call her Monday with results and further recommendations. In interim patient knows to call back ASAP or go to ED if unable to tolerate fluids / medications.  2. HTN. BP elevated today, she hasn't had daily medications. Patient will take anti-emetic followed by anti-hypertensive when she gets home  3. Hyperparathyroidism / hypercalcemia. Hypercalcemia may be contributing to some of her ongoing GI symptoms. Patient needs to follow up with Endocrine. It sounds like there was a billing problem with her former Endocrinologist. I have referred her to [facility]'s new Endocrinologist, Dr. Elvera Lennox. Will check serum calcium and ionized calcium today   Addendum: Reviewed and agree with initial management. KUB reviewed, and nonobstructive pattern. Likely some element of gastroparesis in the setting of narcotics.  Could try low dose reglan for short duration before meals. I continue to believe if hypercalcemia is contributing to slow GI motility and likely some element of abdominal pain Agree with endocrinology referral for evaluation and treatment   Beverley Fiedler, MD

## 2012-10-10 NOTE — Patient Instructions (Addendum)
Please go to the basement floor before leaving today. Go to Radiology for a 2 view xray of your abdomen.  Your physician has requested that you go to the basement for the following lab work before leaving today: Calcium, ionized calcium  We have sent the following medications to your pharmacy for you to pick up at your convenience: Phenergan 12.5 mg--Take 1-2 tablets by mouth every 6 hours as needed for nausea. Do not operate any machinery, drive or drink alcohol while taking this medication. m3/17/14 @ 3:30 --3:15  We have made an appointment for you on Monday, 10/27/12 @ 3:30 pm to see Dr Elvera Lennox (endocrinology) @ [address] [place], [address]. Please arrive at 3:15 pm for registration. Make certain to bring an updated medication list, insurance cards and any specialty copay should you have one. If you need to reschedule your appointment, please call [phone].  We will contact you next week with further plans once your results have come back.  CC: Dr Erick Blinks

## 2012-10-14 ENCOUNTER — Other Ambulatory Visit: Payer: Self-pay | Admitting: *Deleted

## 2012-10-14 MED ORDER — METOCLOPRAMIDE HCL 5 MG PO TABS: ORAL_TABLET | ORAL | Status: DC

## 2012-10-27 ENCOUNTER — Ambulatory Visit: Payer: Medicare Other | Admitting: Internal Medicine

## 2012-10-30 ENCOUNTER — Other Ambulatory Visit: Payer: Self-pay | Admitting: Gastroenterology

## 2012-10-30 MED ORDER — DICYCLOMINE HCL 10 MG PO CAPS: 10.0000 mg | ORAL_CAPSULE | Freq: Three times a day (TID) | ORAL | Status: DC

## 2012-12-03 ENCOUNTER — Encounter (INDEPENDENT_AMBULATORY_CARE_PROVIDER_SITE_OTHER): Payer: Self-pay | Admitting: General Surgery

## 2012-12-03 ENCOUNTER — Ambulatory Visit (INDEPENDENT_AMBULATORY_CARE_PROVIDER_SITE_OTHER): Payer: Medicare Other | Admitting: General Surgery

## 2012-12-03 VITALS — BP 128/82 | HR 78 | Temp 98.6°F | Resp 18 | Ht 67.0 in | Wt 209.5 lb

## 2012-12-03 DIAGNOSIS — K5900 Constipation, unspecified: Secondary | ICD-10-CM

## 2012-12-03 NOTE — Progress Notes (Signed)
Emma Munoz is a 47 y.o. female who is here for a follow up visit regarding her abd pain.  She continues to have central abd pain, chronic nausea and vomiting and constipation.  She has tried a high fiber diet, low fiber diet, bland diet and many different laxatives.  She is currently undergoing a workup for hypercalcemia.  Dr Rhea Belton is working with her on her motility issues.  She just recently tried Reglan, but this did not help.    Objective: Filed Vitals:   12/03/12 1200  BP: 128/82  Pulse: 78  Temp: 98.6 F (37 C)  Resp: 18    General appearance: alert and cooperative GI: soft, nondistended, tender to palpation periumbilically. Possible hernia at inc site  Assessment and Plan: I have asked her to continue her GI and Endocrinology workup for now.  Once these things are completely evaluated, we will address her possible hernia.  I do not feel that it is worth evaluating or fixing until we can get her nausea and vomiting under control.  I will see her back in 3 mo.    Vanita Panda, MD [facility], [place] [phone]

## 2012-12-03 NOTE — Patient Instructions (Signed)
Continue your GI workup.  I will see you back in 3 months.

## 2012-12-09 ENCOUNTER — Other Ambulatory Visit: Payer: Self-pay | Admitting: *Deleted

## 2012-12-10 MED ORDER — LISINOPRIL 20 MG PO TABS: 20.0000 mg | ORAL_TABLET | Freq: Every day | ORAL | Status: DC

## 2012-12-15 ENCOUNTER — Telehealth: Payer: Self-pay | Admitting: *Deleted

## 2012-12-15 NOTE — Telephone Encounter (Signed)
Received a fax from [facility], notice of denial for the Nexium 40 mg

## 2012-12-16 ENCOUNTER — Telehealth: Payer: Self-pay | Admitting: Gastroenterology

## 2012-12-16 NOTE — Telephone Encounter (Signed)
Received a letter from [facility] that they will not cover Nexium, I have started a prior auth, faxed for to [employer] [phone] Lvm for pt to let her know prior Berkley Harvey has been started

## 2012-12-17 ENCOUNTER — Telehealth: Payer: Self-pay | Admitting: Gastroenterology

## 2012-12-17 ENCOUNTER — Other Ambulatory Visit: Payer: Self-pay | Admitting: Gastroenterology

## 2012-12-17 MED ORDER — ESOMEPRAZOLE MAGNESIUM 40 MG PO PACK: 40.0000 mg | PACK | Freq: Every day | ORAL | Status: DC

## 2012-12-17 NOTE — Telephone Encounter (Signed)
[employer] will not cover Nexium 40mg  Dr Rhea Belton recommends Esomeprazole 40mg ; that has been sent in to the pharmacy. Pharmacy will call pt to notify when ready

## 2012-12-20 ENCOUNTER — Emergency Department (HOSPITAL_COMMUNITY): Payer: Medicare Other

## 2012-12-20 ENCOUNTER — Inpatient Hospital Stay (HOSPITAL_COMMUNITY)
Admission: EM | Admit: 2012-12-20 | Discharge: 2012-12-24 | DRG: 389 | Disposition: A | Discharge status: Home / Self Care | Payer: Medicare Other | Attending: Internal Medicine | Admitting: Internal Medicine

## 2012-12-20 ENCOUNTER — Encounter (HOSPITAL_COMMUNITY): Payer: Self-pay | Admitting: *Deleted

## 2012-12-20 ENCOUNTER — Telehealth (INDEPENDENT_AMBULATORY_CARE_PROVIDER_SITE_OTHER): Payer: Self-pay | Admitting: Surgery

## 2012-12-20 DIAGNOSIS — E669 Obesity, unspecified: Secondary | ICD-10-CM

## 2012-12-20 DIAGNOSIS — K5900 Constipation, unspecified: Secondary | ICD-10-CM

## 2012-12-20 DIAGNOSIS — K56609 Unspecified intestinal obstruction, unspecified as to partial versus complete obstruction: Principal | ICD-10-CM

## 2012-12-20 DIAGNOSIS — F329 Major depressive disorder, single episode, unspecified: Secondary | ICD-10-CM | POA: Diagnosis present

## 2012-12-20 DIAGNOSIS — G4700 Insomnia, unspecified: Secondary | ICD-10-CM

## 2012-12-20 DIAGNOSIS — Y9210 Unspecified residential institution as the place of occurrence of the external cause: Secondary | ICD-10-CM | POA: Diagnosis present

## 2012-12-20 DIAGNOSIS — D72829 Elevated white blood cell count, unspecified: Secondary | ICD-10-CM | POA: Diagnosis present

## 2012-12-20 DIAGNOSIS — E209 Hypoparathyroidism, unspecified: Secondary | ICD-10-CM

## 2012-12-20 DIAGNOSIS — R112 Nausea with vomiting, unspecified: Secondary | ICD-10-CM | POA: Diagnosis present

## 2012-12-20 DIAGNOSIS — I10 Essential (primary) hypertension: Secondary | ICD-10-CM

## 2012-12-20 DIAGNOSIS — M5020 Other cervical disc displacement, unspecified cervical region: Secondary | ICD-10-CM

## 2012-12-20 DIAGNOSIS — R110 Nausea: Secondary | ICD-10-CM

## 2012-12-20 DIAGNOSIS — E785 Hyperlipidemia, unspecified: Secondary | ICD-10-CM

## 2012-12-20 DIAGNOSIS — E860 Dehydration: Secondary | ICD-10-CM | POA: Diagnosis present

## 2012-12-20 DIAGNOSIS — K589 Irritable bowel syndrome without diarrhea: Secondary | ICD-10-CM

## 2012-12-20 DIAGNOSIS — Z79899 Other long term (current) drug therapy: Secondary | ICD-10-CM

## 2012-12-20 DIAGNOSIS — F3289 Other specified depressive episodes: Secondary | ICD-10-CM

## 2012-12-20 DIAGNOSIS — D649 Anemia, unspecified: Secondary | ICD-10-CM

## 2012-12-20 DIAGNOSIS — Z9049 Acquired absence of other specified parts of digestive tract: Secondary | ICD-10-CM

## 2012-12-20 DIAGNOSIS — Z90710 Acquired absence of both cervix and uterus: Secondary | ICD-10-CM

## 2012-12-20 DIAGNOSIS — R5381 Other malaise: Secondary | ICD-10-CM

## 2012-12-20 DIAGNOSIS — E8881 Metabolic syndrome: Secondary | ICD-10-CM | POA: Diagnosis present

## 2012-12-20 DIAGNOSIS — G9050 Complex regional pain syndrome I, unspecified: Secondary | ICD-10-CM

## 2012-12-20 DIAGNOSIS — K219 Gastro-esophageal reflux disease without esophagitis: Secondary | ICD-10-CM

## 2012-12-20 DIAGNOSIS — G8929 Other chronic pain: Secondary | ICD-10-CM | POA: Diagnosis present

## 2012-12-20 DIAGNOSIS — R5383 Other fatigue: Secondary | ICD-10-CM

## 2012-12-20 DIAGNOSIS — R209 Unspecified disturbances of skin sensation: Secondary | ICD-10-CM

## 2012-12-20 DIAGNOSIS — F17200 Nicotine dependence, unspecified, uncomplicated: Secondary | ICD-10-CM

## 2012-12-20 DIAGNOSIS — E1165 Type 2 diabetes mellitus with hyperglycemia: Secondary | ICD-10-CM

## 2012-12-20 DIAGNOSIS — Z87898 Personal history of other specified conditions: Secondary | ICD-10-CM

## 2012-12-20 DIAGNOSIS — E210 Primary hyperparathyroidism: Secondary | ICD-10-CM

## 2012-12-20 DIAGNOSIS — E119 Type 2 diabetes mellitus without complications: Secondary | ICD-10-CM | POA: Diagnosis present

## 2012-12-20 DIAGNOSIS — IMO0002 Reserved for concepts with insufficient information to code with codable children: Secondary | ICD-10-CM

## 2012-12-20 DIAGNOSIS — Z01810 Encounter for preprocedural cardiovascular examination: Secondary | ICD-10-CM

## 2012-12-20 DIAGNOSIS — J309 Allergic rhinitis, unspecified: Secondary | ICD-10-CM

## 2012-12-20 DIAGNOSIS — T40605A Adverse effect of unspecified narcotics, initial encounter: Secondary | ICD-10-CM | POA: Diagnosis present

## 2012-12-20 DIAGNOSIS — K56600 Partial intestinal obstruction, unspecified as to cause: Secondary | ICD-10-CM

## 2012-12-20 DIAGNOSIS — Z6833 Body mass index (BMI) 33.0-33.9, adult: Secondary | ICD-10-CM

## 2012-12-20 DIAGNOSIS — K3184 Gastroparesis: Secondary | ICD-10-CM

## 2012-12-20 LAB — COMPREHENSIVE METABOLIC PANEL
AST: 20 U/L (ref 0–37)
BUN: 7 mg/dL (ref 6–23)
CO2: 25 mEq/L (ref 19–32)
Calcium: 10.9 mg/dL — ABNORMAL HIGH (ref 8.4–10.5)
Creatinine, Ser: 0.59 mg/dL (ref 0.50–1.10)
GFR calc Af Amer: >90 mL/min (ref >90)
GFR calc non Af Amer: >90 mL/min (ref >90)
Total Bilirubin: 0.6 mg/dL (ref 0.3–1.2)

## 2012-12-20 LAB — CBC WITH DIFFERENTIAL/PLATELET
Basophils Absolute: 0 10*3/uL (ref 0.0–0.1)
Eosinophils Relative: 1 % (ref 0–5)
HCT: 46.8 % — ABNORMAL HIGH (ref 36.0–46.0)
Hemoglobin: 17.1 g/dL — ABNORMAL HIGH (ref 12.0–15.0)
Lymphocytes Relative: 29 % (ref 12–46)
MCHC: 36.5 g/dL — ABNORMAL HIGH (ref 30.0–36.0)
MCV: 78.8 fL (ref 78.0–100.0)
Monocytes Absolute: 0.6 10*3/uL (ref 0.1–1.0)
Monocytes Relative: 6 % (ref 3–12)
RDW: 13.8 % (ref 11.5–15.5)
WBC: 9 10*3/uL (ref 4.0–10.5)

## 2012-12-20 LAB — URINALYSIS, ROUTINE W REFLEX MICROSCOPIC
Hgb urine dipstick: NEGATIVE
Leukocytes, UA: NEGATIVE
Nitrite: NEGATIVE
Protein, ur: 100 mg/dL — AB
Specific Gravity, Urine: 1.029 (ref 1.005–1.030)
Urobilinogen, UA: 1 mg/dL (ref 0.0–1.0)

## 2012-12-20 LAB — URINE MICROSCOPIC-ADD ON

## 2012-12-20 LAB — LIPASE, BLOOD: Lipase: 11 U/L (ref 11–59)

## 2012-12-20 MED ORDER — PANTOPRAZOLE SODIUM 40 MG IV SOLR
40.0000 mg | Freq: Every day | INTRAVENOUS | Status: DC
  Administered 2012-12-21 – 2012-12-23 (×4): 40 mg via INTRAVENOUS
  Filled 2012-12-20 (×6): qty 40

## 2012-12-20 MED ORDER — DEXTROSE-NACL 5-0.45 % IV SOLN
INTRAVENOUS | Status: DC
  Administered 2012-12-21: 01:00:00 via INTRAVENOUS

## 2012-12-20 MED ORDER — HYDROMORPHONE HCL PF 1 MG/ML IJ SOLN
0.5000 mg | Freq: Once | INTRAMUSCULAR | Status: AC
  Administered 2012-12-20: 0.5 mg via INTRAVENOUS
  Filled 2012-12-20: qty 1

## 2012-12-20 MED ORDER — IOHEXOL 300 MG/ML  SOLN
50.0000 mL | Freq: Once | INTRAMUSCULAR | Status: AC | PRN
  Administered 2012-12-20: 50 mL via ORAL

## 2012-12-20 MED ORDER — HEPARIN SODIUM (PORCINE) 5000 UNIT/ML IJ SOLN
5000.0000 [IU] | Freq: Three times a day (TID) | INTRAMUSCULAR | Status: DC
  Administered 2012-12-21: 5000 [IU] via SUBCUTANEOUS
  Filled 2012-12-20 (×14): qty 1

## 2012-12-20 MED ORDER — MORPHINE SULFATE 4 MG/ML IJ SOLN
4.0000 mg | Freq: Once | INTRAMUSCULAR | Status: AC
  Administered 2012-12-20: 4 mg via INTRAVENOUS
  Filled 2012-12-20: qty 1

## 2012-12-20 MED ORDER — HYDROMORPHONE HCL PF 1 MG/ML IJ SOLN
1.0000 mg | INTRAMUSCULAR | Status: DC | PRN
  Administered 2012-12-21 – 2012-12-22 (×9): 1 mg via INTRAVENOUS
  Filled 2012-12-20 (×9): qty 1

## 2012-12-20 MED ORDER — METOPROLOL TARTRATE 1 MG/ML IV SOLN
10.0000 mg | Freq: Four times a day (QID) | INTRAVENOUS | Status: DC
  Administered 2012-12-21 – 2012-12-24 (×10): 10 mg via INTRAVENOUS
  Filled 2012-12-20 (×6): qty 10
  Filled 2012-12-20: qty 5
  Filled 2012-12-20 (×11): qty 10
  Filled 2012-12-20: qty 5
  Filled 2012-12-20: qty 10

## 2012-12-20 MED ORDER — ONDANSETRON HCL 4 MG PO TABS: 4.0000 mg | ORAL_TABLET | Freq: Four times a day (QID) | ORAL | Status: DC | PRN

## 2012-12-20 MED ORDER — IOHEXOL 300 MG/ML  SOLN
100.0000 mL | Freq: Once | INTRAMUSCULAR | Status: AC | PRN
  Administered 2012-12-20: 100 mL via INTRAVENOUS

## 2012-12-20 MED ORDER — ONDANSETRON HCL 4 MG/2ML IJ SOLN
4.0000 mg | Freq: Four times a day (QID) | INTRAMUSCULAR | Status: DC | PRN
  Administered 2012-12-21 – 2012-12-22 (×4): 4 mg via INTRAVENOUS
  Filled 2012-12-20 (×4): qty 2

## 2012-12-20 MED ORDER — LORAZEPAM 2 MG/ML IJ SOLN
1.0000 mg | Freq: Once | INTRAMUSCULAR | Status: AC
  Administered 2012-12-20: 1 mg via INTRAVENOUS
  Filled 2012-12-20: qty 1

## 2012-12-20 MED ORDER — ONDANSETRON HCL 4 MG/2ML IJ SOLN
4.0000 mg | Freq: Once | INTRAMUSCULAR | Status: AC
  Administered 2012-12-20: 4 mg via INTRAVENOUS
  Filled 2012-12-20: qty 2

## 2012-12-20 NOTE — ED Provider Notes (Signed)
I saw and evaluated the patient, reviewed the resident's note and I agree with the findings and plan. On my exam the patient was in no distress, but w obvious concern for SBO, she had CT.  This was c/w SBO.  Gerhard Munch, MD 12/20/12 2352

## 2012-12-20 NOTE — ED Notes (Addendum)
Pt in c/o abd pain with n/v since yesterday, abd distention, history of small bowel obstruction and states this feels similar. Pt states she is also concerned that she will go into withdrawals, states she is a pain management patient and has been unable to keep her medications down.

## 2012-12-20 NOTE — ED Notes (Signed)
Hospitalist is aware the patient was unable to tolerate the NG tube and that she left to smoke.

## 2012-12-20 NOTE — ED Provider Notes (Signed)
History     CSN: [id number]  Arrival date & time 12/20/12  1629   First MD Initiated Contact with Patient 12/20/12 1706      Chief Complaint  Patient presents with  . Abdominal Pain    (Consider location/radiation/quality/duration/timing/severity/associated sxs/prior treatment) HPI Comments: 47 y.o. female w/ pmh of SBO, she has had recurrent surgical procedures secondary to cholecystectomy that required revision. She states she feels like she had a SBO right now. She has not had a BM since yesterday, her abd is distended, and she has not passed gas since last night. Has had multiple episodes of vomitus.   Patient is a 47 y.o. female presenting with abdominal pain. The history is provided by the patient.  Abdominal Pain Pain location:  Generalized Pain quality: not aching   Pain radiates to:  Does not radiate Pain severity:  Moderate Onset quality:  Gradual Timing:  Constant Chronicity:  Recurrent Associated symptoms: nausea and vomiting   Associated symptoms: no chest pain, no chills, no cough, no diarrhea, no fatigue, no fever, no melena, no shortness of breath and no vaginal discharge     Past Medical History  Diagnosis Date  . Hypertension   . IBS (irritable bowel syndrome)   . Heart murmur   . Hyperglycemia   . Tobacco abuse   . Type II diabetes mellitus     for 6 weeks only 09/2011-11/2011  . GERD (gastroesophageal reflux disease)   . Gastroparesis   . Primary hyperparathyroidism   . Migraines   . Hyperlipidemia   . Metabolic syndrome X   . Obesity   . Anemia   . Depression   . Reflex sympathetic dystrophy   . Herniated cervical disc   . Insomnia   . Stenosis colon     s/p bowel resection for small bowel stenosis    Past Surgical History  Procedure Laterality Date  . Cesarean section  85, 87, 95  . Lumbar disc surgery  1993; 2000  . Ulnar nerve repair  1998    left "got my hand crushed"  . Myomectomy  1982-2005    "multiple; mostly w/scopes"  (05/13/2012)  . Bowel resection  04/25/2012    Procedure: SMALL BOWEL RESECTION;  Surgeon: Romie Levee, MD;  Location: [facility] OR;  Service: General;  Laterality: N/A;  small bowell resection, incisional hernia repair  . Incisional hernia repair  04/25/2012    Procedure: HERNIA REPAIR INCISIONAL;  Surgeon: Romie Levee, MD;  Location: [facility] OR;  Service: General;  Laterality: N/A;  . Colon surgery  05/13/2012    small bowel resection w/IHR  . Cholecystectomy  11/2011    With complications  . Vaginal hysterectomy  2005  . Tubal ligation  1995  . Collateral ligament repair, knee  1987; 1990's    left  . Implantation vagal nerve stimulator  07/2011    "lower back" (05/13/2012)  . Shoulder arthroscopy w/ rotator cuff repair  ~ 2005    left  . Hernia repair  04/2012; 05/13/2012    incisional  . Back surgery    . Small intestine surgery  05/05/12    Family History  Problem Relation Age of Onset  . Early death Father 73    GUNSHOT  . Kidney disease Brother     s/p transplant  . Heart disease Maternal Grandmother     Heart defect  . Prostate cancer Maternal Grandfather   . Cancer Maternal Grandfather     prostate    History  Substance  Use Topics  . Smoking status: Current Every Day Smoker -- 1.00 packs/day for 24 years    Types: Cigarettes  . Smokeless tobacco: Never Used  . Alcohol Use: No     Comment: Rarely    OB History   Grav Para Term Preterm Abortions TAB SAB Ect Mult Living                  Review of Systems  Constitutional: Negative for fever, chills and fatigue.  HENT: Negative for facial swelling, drooling, neck pain and dental problem.   Eyes: Negative for pain, discharge and itching.  Respiratory: Negative for cough, choking, shortness of breath, wheezing and stridor.   Cardiovascular: Negative for chest pain.  Gastrointestinal: Positive for nausea, vomiting and abdominal pain. Negative for diarrhea and melena.  Endocrine: Negative for cold intolerance and heat  intolerance.  Genitourinary: Negative for vaginal discharge, difficulty urinating and vaginal pain.  Skin: Negative for pallor and rash.  Neurological: Negative for dizziness, light-headedness and headaches.  Psychiatric/Behavioral: Negative for behavioral problems and agitation.    Allergies  Aspirin; Dilaudid; Ibuprofen; Latex; Nsaids; and Gadolinium derivatives  Home Medications   Current Outpatient Rx  Name  Route  Sig  Dispense  Refill  . bifidobacterium infantis (ALIGN) capsule   Oral   Take 1 capsule by mouth daily.   14 capsule   0   . cyclobenzaprine (FLEXERIL) 10 MG tablet   Oral   Take 20 mg by mouth at bedtime.         . dicyclomine (BENTYL) 10 MG capsule   Oral   Take 1 capsule (10 mg total) by mouth 3 (three) times daily before meals.   50 capsule   0   . esomeprazole (NEXIUM) 40 MG packet   Oral   Take 40 mg by mouth daily before breakfast.   30 each   12   . Linaclotide (LINZESS) 290 MCG CAPS   Oral   Take 290 mcg by mouth daily.   30 capsule   6   . lisinopril (PRINIVIL,ZESTRIL) 20 MG tablet   Oral   Take 1 tablet (20 mg total) by mouth daily.   30 tablet   0     Last refill, patient needs to call office to sched ...   . metoCLOPramide (REGLAN) 5 MG tablet      Take one po TID(30 minutes prior to meals)   90 tablet   0   . metoprolol (LOPRESSOR) 100 MG tablet   Oral   Take 1 tablet (100 mg total) by mouth daily.   30 tablet   6   . morphine (KADIAN) 100 MG 24 hr capsule   Oral   Take 100 mg by mouth 2 (two) times daily.          Marland Kitchen morphine (MSIR) 30 MG tablet   Oral   Take 3 tablets (90 mg total) by mouth every 4 (four) hours as needed. For breakthrough pain   120 tablet   0   . ondansetron (ZOFRAN) 4 MG tablet   Oral   Take 1 tablet (4 mg total) by mouth every 8 (eight) hours as needed for nausea.   20 tablet   0   . polyethylene glycol powder (GLYCOLAX/MIRALAX) powder   Oral   Take 17 g by mouth daily.   255 g    3   . promethazine (PHENERGAN) 12.5 MG tablet      Take 1-2 tablets by mouth every  6 hours as needed for nausea.   40 tablet   0     BP 164/118  Pulse 110  Temp(Src) 98.7 F (37.1 C) (Oral)  Resp 22  SpO2 97%  Physical Exam  Constitutional: She is oriented to person, place, and time. She appears well-developed. No distress.  HENT:  Head: Normocephalic and atraumatic.  Eyes: Pupils are equal, round, and reactive to light. Right eye exhibits no discharge. Left eye exhibits no discharge.  Neck: Neck supple. No tracheal deviation present.  Cardiovascular: Normal rate.  Exam reveals no gallop and no friction rub.   Pulmonary/Chest: No stridor. No respiratory distress. She has no wheezes.  Abdominal: Soft. She exhibits distension. There is tenderness.  More in epigastric area with diffuse area of ttp in her abdomen as well   Genitourinary:  No hard stool in rectal vault, not impacted   Musculoskeletal: She exhibits no edema and no tenderness.  Neurological: She is alert and oriented to person, place, and time.  Skin: Skin is warm. She is not diaphoretic.    ED Course  Procedures (including critical care time)  Labs Reviewed  CBC WITH DIFFERENTIAL - Abnormal; Notable for the following:    RBC 5.94 (*)    Hemoglobin 17.1 (*)    HCT 46.8 (*)    MCHC 36.5 (*)    All other components within normal limits  COMPREHENSIVE METABOLIC PANEL  URINALYSIS, ROUTINE W REFLEX MICROSCOPIC   Dg Abd Acute W/chest  12/20/2012  *RADIOLOGY REPORT*  Clinical Data: Chest and abdominal pain, constipation, hypertension, history small bowel obstruction and smoking  ACUTE ABDOMEN SERIES (ABDOMEN 2 VIEW & CHEST 1 VIEW)  Comparison: 10/10/2012 abdominal radiographs, chest radiograph 04/24/2012  Findings: Intraspinal stimulator in cervical region with pulse generator in right mid abdomen. Normal heart size, mediastinal contours, and pulmonary vascularity. Chronic peribronchial thickening. No acute  infiltrate, pleural effusion or pneumothorax. Increased stool throughout colon. No evidence of bowel obstruction, bowel wall thickening, or free intraperitoneal air. Bones appear demineralized. Surgical clips right quadrant question cholecystectomy.  IMPRESSION: Chronic bronchitic changes. Increased stool in colon.   Original Report Authenticated By: Ulyses Southward, M.D.       MDM   Concern for SBO based on HPI and physical exam and pt's hx. Will also check lipase. AAS negative so will get CT imaging to further evaluate pt's abdominal pain.   Controlling pt's pain and nausea w/ morphine and zofran.   Pt's CT scan shows obstruction. Will place NG and pt is being admitted to hospitalist team for further evaluation and care.   1. Partial small bowel obstruction           Bernadene Person, MD 12/20/12 2229

## 2012-12-20 NOTE — ED Notes (Signed)
The patient is not in her room, and her location is unknown.

## 2012-12-20 NOTE — ED Notes (Signed)
Three attempts made to place 44 French NG tube in right nare, but the patient keeps gasping for air.  Patient is declining another attempt at this time (in either nostril), and she says she thinks she should have this done under general anesthesia.

## 2012-12-20 NOTE — Telephone Encounter (Signed)
On chronic narcotics for chronic pain.  Feels worse.  Had cholecystectomy April 2013 at [facility] with LOA.  Found to have bowel injury a few days later.  Repaired.  Developed a  stricture.  Had this surgically resected by Dr. Maisie Fus in September 2013.  Patient notes she is very uncomfortable.  Throwing up up liquids.  Patient called complaining of nausea vomiting and bloating.   I recommended she go to the emergency room.  She initially was hesitant as because she is a chronic pain patient with a narcotic contract the ER we will give her attitude about her requests for narcotics.  I stressed to her the importance of avoiding dehydration and getting evaluation.  Hard to know if she has recurrent bowel obstruction/stricture or if she has worsening constipation and requires an enema.  She plans to go to the [facility] emergency room.

## 2012-12-20 NOTE — ED Notes (Signed)
The patient was unable to tolerate the OG tube insertion.

## 2012-12-20 NOTE — ED Notes (Signed)
Received a call from [name], RN (in nurse first) that the patient was seen hustling past her.  Emma Munoz attempted to ask the patient questions, but she refused to stop.

## 2012-12-20 NOTE — ED Notes (Signed)
Patient found in front of the hospital smoking a cigarette.  Advised the patient she cannot leave the ER with an IV or while she is an active patient.

## 2012-12-21 ENCOUNTER — Encounter (HOSPITAL_COMMUNITY): Payer: Self-pay | Admitting: General Surgery

## 2012-12-21 DIAGNOSIS — R110 Nausea: Secondary | ICD-10-CM

## 2012-12-21 DIAGNOSIS — R109 Unspecified abdominal pain: Secondary | ICD-10-CM

## 2012-12-21 DIAGNOSIS — K56609 Unspecified intestinal obstruction, unspecified as to partial versus complete obstruction: Secondary | ICD-10-CM

## 2012-12-21 LAB — COMPREHENSIVE METABOLIC PANEL
Albumin: 3.6 g/dL (ref 3.5–5.2)
Alkaline Phosphatase: 101 U/L (ref 39–117)
BUN: 9 mg/dL (ref 6–23)
CO2: 26 mEq/L (ref 19–32)
Chloride: 101 mEq/L (ref 96–112)
GFR calc Af Amer: >90 mL/min (ref >90)
GFR calc non Af Amer: >90 mL/min (ref >90)
Glucose, Bld: 114 mg/dL — ABNORMAL HIGH (ref 70–99)
Potassium: 3.9 mEq/L (ref 3.5–5.1)
Total Bilirubin: 0.6 mg/dL (ref 0.3–1.2)

## 2012-12-21 LAB — CBC
MCV: 79.9 fL (ref 78.0–100.0)
Platelets: 287 10*3/uL (ref 150–400)
RDW: 14.2 % (ref 11.5–15.5)
WBC: 13 10*3/uL — ABNORMAL HIGH (ref 4.0–10.5)

## 2012-12-21 MED ORDER — DICYCLOMINE HCL 10 MG PO CAPS
10.0000 mg | ORAL_CAPSULE | Freq: Three times a day (TID) | ORAL | Status: DC
  Filled 2012-12-21 (×3): qty 1

## 2012-12-21 MED ORDER — LISINOPRIL 20 MG PO TABS
20.0000 mg | ORAL_TABLET | Freq: Every day | ORAL | Status: DC
  Administered 2012-12-21: 20 mg via ORAL
  Filled 2012-12-21: qty 1

## 2012-12-21 MED ORDER — CLONIDINE HCL 0.1 MG/24HR TD PTWK
0.1000 mg | MEDICATED_PATCH | TRANSDERMAL | Status: DC
  Administered 2012-12-21: 0.1 mg via TRANSDERMAL
  Filled 2012-12-21: qty 1

## 2012-12-21 MED ORDER — SODIUM CHLORIDE 0.9 % IV SOLN
INTRAVENOUS | Status: DC
  Administered 2012-12-21: 02:00:00 via INTRAVENOUS

## 2012-12-21 MED ORDER — POTASSIUM CHLORIDE IN NACL 40-0.9 MEQ/L-% IV SOLN
INTRAVENOUS | Status: DC
  Administered 2012-12-21: 13:00:00 via INTRAVENOUS
  Filled 2012-12-21 (×9): qty 1000

## 2012-12-21 MED ORDER — INSULIN ASPART 100 UNIT/ML ~~LOC~~ SOLN: 0.0000 [IU] | SUBCUTANEOUS | Status: DC

## 2012-12-21 MED ORDER — SALINE SPRAY 0.65 % NA SOLN
1.0000 | NASAL | Status: DC | PRN
  Filled 2012-12-21: qty 44

## 2012-12-21 MED ORDER — CYCLOBENZAPRINE HCL 10 MG PO TABS
20.0000 mg | ORAL_TABLET | Freq: Every day | ORAL | Status: DC
  Administered 2012-12-21: 20 mg via ORAL
  Filled 2012-12-21: qty 2
  Filled 2012-12-21 (×2): qty 1

## 2012-12-21 MED ORDER — CLONIDINE HCL 0.2 MG/24HR TD PTWK
0.2000 mg | MEDICATED_PATCH | TRANSDERMAL | Status: DC
  Filled 2012-12-21: qty 1

## 2012-12-21 MED ORDER — ACETAMINOPHEN 650 MG RE SUPP: 650.0000 mg | RECTAL | Status: DC | PRN

## 2012-12-21 MED ORDER — HYDRALAZINE HCL 20 MG/ML IJ SOLN
10.0000 mg | INTRAMUSCULAR | Status: DC | PRN
  Administered 2012-12-21: 10 mg via INTRAVENOUS
  Filled 2012-12-21 (×2): qty 1

## 2012-12-21 NOTE — Progress Notes (Signed)
[facility] PROGRESS NOTE  Emma Munoz RUE:[id number] DOB: 11-Dec-1965 DOA: 12/20/2012 PCP: Wynona Dove, MD  Assessment/Plan: Active Problems:   * No active hospital problems. *     1. 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 Abdominal/pelvic CT demonstrated mid small bowel obstruction located at a small bowel anastomosis in the mid abdomen consistent with an anastomotic stricture. Managing with bowel rest, NG-T, ivi fluids. Surgery consulted. Awaiting recommendations.  2. HTN. Patient currently has elevated BP. Pain likely contributing. On Prn Hydralazine, but will add Clonidine patch, as patient is NPO.  3. DM-2. Per patient this is diet-controlled, and has been under control, since she lost a significant amount of weight. Not on diabetic medication at this time. HBA1C is pending. Patient has declined SSI at this time. We shall observe, and re-address, if hyperglycemia develops and persists.   4. Hypercalcemia: Calcium was 10.9 at presentation, and has dropped to 10.4 overnight, following hydration. Patient does have a known history of primary  hyperparathyroidism. Following levels.  5. Chronic pain: In setting of reflex sympathetic dystrophy and degenerative disc disease. Not problematic at this time.    Code Status: Full Code.  Family Communication:  Disposition Plan: To be determined.    Brief narrative: 47 y.o. year old female with history of primary hyperparathyroidism, DM-2, obesity, metabolic syndrome-X, dyslipidemia, chronic anemia, HTN, allergic rhinitis, reflex sympathetic dystrophy, GERD, gastroparesis, herniated cervical disk, depression, tobacco abuse, migraines, IBS, recurrent small bowel instructions status post resection, presenting with small bowel  obstruction. Patient states that she has intermittent mild abdominal pain on regular basis. However, she developed acute onset of severe abdominal pain as well as nausea beginning early 12/20/12. Patient states that these issues initially started as a postoperative complication from cholecystectomy where she had a an accidental "nicking" of her bowel, that led to secondary sepsis. Patient states she had multiple episodes of small bowel obstruction subsequent to a septic episode. She eventually had obstruction repaired by Dr. Romie Levee, in 04/2012 and her symptoms have been much improved since this point. Patient denies any recent changes in her diet. No recent strenuous activity. Patient states she has been under some stress.  In the ER, lab work was generally within normal limits. However, a CT scan of the abdomen was obtained showing a small bowel obstruction at the area of prior anastomosis. Admitted for further management.    Consultants:  Surgery.   Procedures:  CXR/Abdominal X-Ray.   CT abdomen/pelvic CT scan.   Antibiotics:  N/A.  HPI/Subjective: Still has crampy central abdominal pain.   Objective: Vital signs in last 24 hours: Temp:  [97.9 F (36.6 C)-99 F (37.2 C)] 97.9 F (36.6 C) (05/11 0644) Pulse Rate:  [79-110] 79 (05/11 0644) Resp:  [18-22] 18 (05/11 0644) BP: (150-180)/(102-127) 150/110 mmHg (05/11 0644) SpO2:  [94 %-98 %] 96 % (05/11 0644) Weight:  [93.441 kg (206 lb)-94.4 kg (208 lb 1.8 oz)] 94.4 kg (208 lb 1.8 oz) (05/11 0015) Weight change:     Intake/Output from previous day: 05/10 0701 - 05/11 0700 In: -  Out: 200 [Emesis/NG output:200]     Physical Exam: General: Comfortable, alert, communicative, fully oriented, not short of breath at rest. NG-T draining. Hydration appears fair. HEENT:  No clinical pallor, no jaundice, no conjunctival injection or discharge. NECK:  Supple, JVP not seen,  no carotid bruits, no palpable lymphadenopathy, no  palpable goiter. CHEST:  Clinically clear to auscultation, no wheezes, no crackles. HEART:  Sounds 1 and 2 heard, normal, regular, no murmurs. ABDOMEN:  Full, mildly distended, tender in central abdomen, bowel sounds heard. Midline laparotomy scar noted. No palpable organomegaly, no palpable masses, normal bowel sounds. GENITALIA:  Not examined. LOWER EXTREMITIES:  No pitting edema, palpable peripheral pulses. MUSCULOSKELETAL SYSTEM:  Un remarkable. CENTRAL NERVOUS SYSTEM:  No focal neurologic deficit on gross examination.  Lab Results:  Recent Labs  12/20/12 1637  WBC 9.0  HGB 17.1*  HCT 46.8*  PLT 320    Recent Labs  12/20/12 1637  NA 136  K 3.8  CL 100  CO2 25  GLUCOSE 112*  BUN 7  CREATININE 0.59  CALCIUM 10.9*   No results found for this or any previous visit (from the past 240 hour(s)).   Studies/Results: Ct Abdomen Pelvis W Contrast  12/20/2012  *RADIOLOGY REPORT*  Clinical Data: Right lower quadrant tenderness, periumbilical and supraumbilical abdominal pain, nausea, vomiting, constipation, abdominal distention, past history small bowel obstruction with bowel surgery, hysterectomy, cholecystectomy, incisional hernia repair, hypertension, smoking, diabetes  CT ABDOMEN AND PELVIS WITH CONTRAST  Technique:  Multidetector CT imaging of the abdomen and pelvis was performed following the standard protocol during bolus administration of intravenous contrast. Sagittal and coronal MPR images reconstructed from axial data set.  Contrast: OMNIPAQUE IOHEXOL 300 MG/ML  SOLN Dilute oral contrast.  Comparison: 07/16/2012  Findings: Mild dependent atelectasis at bilateral lung bases. 4 mm diameter nonobstructing calculus lower pole right kidney image 33. Liver, spleen, pancreas, kidneys, and adrenal glands otherwise normal appearance. Normal appendix. Small amount of nonspecific low attenuation free pelvic fluid.  Dilated small bowel loops in mid abdomen with a small bowel stool  sign. These extend to a small bowel anastomotic staple line in mid abdomen image 39 with decompressed small bowel beyond this site consistent with an anastomotic stricture with small bowel obstruction. Minimal hazy edema of the associated mesentery. Stomach and colon decompressed.  Uterus surgically absent with normal sized right ovary. Mildly enlarged but stable left ovary containing a small cyst. Unremarkable bladder and ureters. No mass, adenopathy, or free air. Small ventral hernia containing fat at mid abdomen image 43. No acute osseous findings  IMPRESSION: Mid small bowel obstruction located at a small bowel anastomosis in the mid abdomen consistent with an anastomotic stricture. 4 mm nonobstructing calculus lower pole right kidney. Small amount of nonspecific free intraperitoneal fluid.   Original Report Authenticated By: Ulyses Southward, M.D.    Dg Abd Acute W/chest  12/20/2012  *RADIOLOGY REPORT*  Clinical Data: Chest and abdominal pain, constipation, hypertension, history small bowel obstruction and smoking  ACUTE ABDOMEN SERIES (ABDOMEN 2 VIEW & CHEST 1 VIEW)  Comparison: 10/10/2012 abdominal radiographs, chest radiograph 04/24/2012  Findings: Intraspinal stimulator in cervical region with pulse generator in right mid abdomen. Normal heart size, mediastinal contours, and pulmonary vascularity. Chronic peribronchial thickening. No acute infiltrate, pleural effusion or pneumothorax. Increased stool throughout colon. No evidence of bowel obstruction, bowel wall thickening, or free intraperitoneal air. Bones appear demineralized. Surgical clips right quadrant question cholecystectomy.  IMPRESSION: Chronic bronchitic changes. Increased stool in colon.   Original Report Authenticated By: Ulyses Southward, M.D.     Medications: Scheduled Meds: . cyclobenzaprine  20 mg Oral QHS  . dicyclomine  10 mg Oral TID AC  . heparin  5,000 Units Subcutaneous Q8H  . insulin aspart  0-15 Units Subcutaneous  Q4H  . lisinopril   20 mg Oral Daily  . metoprolol  10 mg Intravenous Q6H  . pantoprazole (PROTONIX) IV  40 mg Intravenous QHS   Continuous Infusions: . sodium chloride 100 mL/hr at 12/21/12 0202  . dextrose 5 % and 0.45% NaCl 100 mL/hr at 12/21/12 0037   PRN Meds:.hydrALAZINE, HYDROmorphone (DILAUDID) injection, ondansetron (ZOFRAN) IV, ondansetron    LOS: 1 day   Emma Munoz,CHRISTOPHER  [facility] Pager [phone]. If 8PM-8AM, please contact night-coverage at [email or web address], password [facility] 12/21/2012, 7:31 AM  LOS: 1 day

## 2012-12-21 NOTE — Consult Note (Signed)
I saw the patient, participated in the history, exam and medical decision making, and concur with the physician assistant's note above.  Postprandial abd pain - pain can occur anywhere 30 min to 2 hrs after eating. Pain generally lasts several hours, been going on for awhile. Came to ED because this episode did not go away and was worse in intensity.   Obese, soft, diffuse TTP. No peritonitis.   Plan per PA.  If wbc goes up more, may need exp lap sooner rather than later  Mary Sella. Andrey Campanile, MD, FACS General, Bariatric, & Minimally Invasive Surgery [facility], [place]

## 2012-12-21 NOTE — H&P (Signed)
Hospitalist Admission History and Physical  Patient name: Emma Munoz Medical record number: [id number] Date of birth: 11-09-1965 Age: 47 y.o. Gender: female  Primary Care Provider: Wynona Dove, MD  Chief Complaint: Small bowel obstruction History of Present Illness:This is a 47 y.o. year old female with significant past medical history of multiple medical problems including recurrent small bowel instructions status post resection, IBS presenting with small bowel obstruction. Patient states that she has intermittent mild abdominal pain on regular basis. However, patient said she had acute onset of severe abdominal pain as well as nausea beginning early yesterday. Patient states that these issues initially started as a postoperative complication from cholecystectomy where she had a an accidental nicking of her: That led to secondary sepsis. Patient states she had multiple episodes of small bowel obstruction subsequent to a septic episode. Patient eventually had obstruction repaired by Dr. Romie Levee. Patient states that symptoms have been much improved since this point. Patient denies any recent changes in her diet. No recent strenuous activity. Patient states she has been under some stress. In the ER, lab work was generally within normal limits. However, a CT scan of the abdomen was obtained showing a small bowel obstruction at the area of prior anastomosis.    Patient Active Problem List   Diagnosis Date Noted  . Hypercalcemia 10/13/2012  . Nausea and vomiting 10/10/2012  . Infected hematoma following procedure 05/15/2012  . Constipation   . IBS (irritable bowel syndrome)   . 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 Fatigue 12/21/2011  . Abdominal pain 12/06/2011  . Open abdominal wall wound 12/06/2011  . Preop cardiovascular exam 11/16/2011  . Hypertension   . Primary hyperparathyroidism 11/01/2011  . Diabetes mellitus type 2, uncontrolled 10/19/2011  . Nausea 10/19/2011  .  MIGRAINES, HX OF 07/22/2009  . PARESTHESIA 07/14/2008  . ALLERGIC RHINITIS 05/19/2008  . FATIGUE 06/18/2007  . HYPERLIPIDEMIA 06/17/2007  . METABOLIC SYNDROME X 06/17/2007  . OBESITY 06/17/2007  . ANEMIA-NOS 06/17/2007  . TOBACCO ABUSE 06/17/2007  . DEPRESSION 06/17/2007  . 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 HYPERTENSION 06/17/2007  . GERD 06/17/2007  . GASTROPARESIS 06/17/2007  . HERNIATED CERVICAL DISC 06/17/2007  . INSOMNIA 06/17/2007   Past Medical History: Past Medical History  Diagnosis Date  . Hypertension   . IBS (irritable bowel syndrome)   . Heart murmur   . Hyperglycemia   . Tobacco abuse   . Type II diabetes mellitus     for 6 weeks only 09/2011-11/2011  . GERD (gastroesophageal reflux disease)   . Gastroparesis   . Primary hyperparathyroidism   . Migraines   . Hyperlipidemia   . Metabolic syndrome X   . Obesity   . Anemia   . Depression   . Reflex sympathetic dystrophy   . Herniated cervical disc   . Insomnia   . Stenosis colon     s/p bowel resection for small bowel stenosis    Past Surgical History: Past Surgical History  Procedure Laterality Date  . Cesarean section  85, 87, 95  . Lumbar disc surgery  1993; 2000  . Ulnar nerve repair  1998    left "got my hand crushed"  . Myomectomy  1982-2005    "multiple; mostly w/scopes" (05/13/2012)  . Bowel resection  04/25/2012    Procedure: SMALL BOWEL RESECTION;  Surgeon: Romie Levee, MD;  Location: [facility] OR;  Service: General;  Laterality: N/A;  small bowell resection, incisional hernia repair  . Incisional hernia repair  04/25/2012  Procedure: HERNIA REPAIR INCISIONAL;  Surgeon: Romie Levee, MD;  Location: [facility] OR;  Service: General;  Laterality: N/A;  . Colon surgery  05/13/2012    small bowel resection w/IHR  . Cholecystectomy  11/2011    With complications  . Vaginal hysterectomy  2005  . Tubal ligation  1995  . Collateral ligament repair, knee  1987; 1990's    left  . Implantation vagal  nerve stimulator  07/2011    "lower back" (05/13/2012)  . Shoulder arthroscopy w/ rotator cuff repair  ~ 2005    left  . Hernia repair  04/2012; 05/13/2012    incisional  . Back surgery    . Small intestine surgery  05/05/12    Social History: History   Social History  . Marital Status: Married    Spouse Name: N/A    Number of Children: 3  . Years of Education: N/A   Occupational History  . Retired    Social History Main Topics  . Smoking status: Current Every Day Smoker -- 1.00 packs/day for 24 years    Types: Cigarettes  . Smokeless tobacco: Never Used  . Alcohol Use: No     Comment: Rarely  . Drug Use: No  . Sexually Active: Yes    Birth Control/ Protection: Surgical   Other Topics Concern  . None   Social History Narrative   Lives with husband and 17YO daughter. 2 dogs. Disabled after left arm injury.    Family History: Family History  Problem Relation Age of Onset  . Early death Father 39    GUNSHOT  . Kidney disease Brother     s/p transplant  . Heart disease Maternal Grandmother     Heart defect  . Prostate cancer Maternal Grandfather   . Cancer Maternal Grandfather     prostate    Allergies: Allergies  Allergen Reactions  . Aspirin Anaphylaxis    REACTION: Tongue, throat, extremities swell  . Dilaudid (Hydromorphone Hcl) Nausea And Vomiting  . Ibuprofen Anaphylaxis    REACTION: Tongue, throat, extremities swell  . Latex Hives  . Nsaids Anaphylaxis    REACTION: Tongue, throat, extremities swell  . Gadolinium Derivatives Nausea And Vomiting and Other (See Comments)    Pt states nausea and vomiting to GAD in MRI pt states she does ok with iv/cm in cat scan    Current Facility-Administered Medications  Medication Dose Route Frequency Provider Last Rate Last Dose  . dextrose 5 %-0.45 % sodium chloride infusion   Intravenous Continuous Doree Albee, MD 100 mL/hr at 12/21/12 0037    . heparin injection 5,000 Units  5,000 Units Subcutaneous Q8H  Doree Albee, MD      . HYDROmorphone (DILAUDID) injection 1 mg  1 mg Intravenous Q2H PRN Doree Albee, MD   1 mg at 12/21/12 0037  . metoprolol (LOPRESSOR) injection 10 mg  10 mg Intravenous Q6H Doree Albee, MD   10 mg at 12/21/12 0035  . ondansetron (ZOFRAN) tablet 4 mg  4 mg Oral Q6H PRN Doree Albee, MD       Or  . ondansetron [facility]) injection 4 mg  4 mg Intravenous Q6H PRN Doree Albee, MD   4 mg at 12/21/12 0036  . pantoprazole (PROTONIX) injection 40 mg  40 mg Intravenous QHS Doree Albee, MD   40 mg at 12/21/12 0036   Review Of Systems: 12 point ROS negative except as noted above in HPI.  Physical Exam: Filed Vitals:   12/20/12 2116  BP: 180/120  Pulse: 90  Temp: 98.6 F (37 C)  Resp: 18    General: alert, cooperative and in mild amount of pain  HEENT: PERRLA and extra ocular movement intact, dry oral mucous membranes Heart: S1, S2 normal, no murmur, rub or gallop, regular rate and rhythm Lungs: clear to auscultation, no wheezes or rales and unlabored breathing Abdomen:+ TTP diffuselu Extremities: extremities normal, atraumatic, no cyanosis or edema Skin:no rashes Neurology: normal without focal findings  Labs and Imaging: Lab Results  Component Value Date/Time   NA 136 12/20/2012  4:37 PM   K 3.8 12/20/2012  4:37 PM   CL 100 12/20/2012  4:37 PM   CO2 25 12/20/2012  4:37 PM   BUN 7 12/20/2012  4:37 PM   CREATININE 0.59 12/20/2012  4:37 PM   CREATININE 0.62 04/16/2012  2:19 PM   GLUCOSE 112* 12/20/2012  4:37 PM   Lab Results  Component Value Date   WBC 9.0 12/20/2012   HGB 17.1* 12/20/2012   HCT 46.8* 12/20/2012   MCV 78.8 12/20/2012   PLT 320 12/20/2012    Ct Abdomen Pelvis W Contrast  12/20/2012  *RADIOLOGY REPORT*  Clinical Data: Right lower quadrant tenderness, periumbilical and supraumbilical abdominal pain, nausea, vomiting, constipation, abdominal distention, past history small bowel obstruction with bowel surgery, hysterectomy, cholecystectomy, incisional  hernia repair, hypertension, smoking, diabetes  CT ABDOMEN AND PELVIS WITH CONTRAST  Technique:  Multidetector CT imaging of the abdomen and pelvis was performed following the standard protocol during bolus administration of intravenous contrast. Sagittal and coronal MPR images reconstructed from axial data set.  Contrast: OMNIPAQUE IOHEXOL 300 MG/ML  SOLN Dilute oral contrast.  Comparison: 07/16/2012  Findings: Mild dependent atelectasis at bilateral lung bases. 4 mm diameter nonobstructing calculus lower pole right kidney image 33. Liver, spleen, pancreas, kidneys, and adrenal glands otherwise normal appearance. Normal appendix. Small amount of nonspecific low attenuation free pelvic fluid.  Dilated small bowel loops in mid abdomen with a small bowel stool sign. These extend to a small bowel anastomotic staple line in mid abdomen image 39 with decompressed small bowel beyond this site consistent with an anastomotic stricture with small bowel obstruction. Minimal hazy edema of the associated mesentery. Stomach and colon decompressed.  Uterus surgically absent with normal sized right ovary. Mildly enlarged but stable left ovary containing a small cyst. Unremarkable bladder and ureters. No mass, adenopathy, or free air. Small ventral hernia containing fat at mid abdomen image 43. No acute osseous findings  IMPRESSION: Mid small bowel obstruction located at a small bowel anastomosis in the mid abdomen consistent with an anastomotic stricture. 4 mm nonobstructing calculus lower pole right kidney. Small amount of nonspecific free intraperitoneal fluid.   Original Report Authenticated By: Ulyses Southward, M.D.    Dg Abd Acute W/chest  12/20/2012  *RADIOLOGY REPORT*  Clinical Data: Chest and abdominal pain, constipation, hypertension, history small bowel obstruction and smoking  ACUTE ABDOMEN SERIES (ABDOMEN 2 VIEW & CHEST 1 VIEW)  Comparison: 10/10/2012 abdominal radiographs, chest radiograph 04/24/2012  Findings:  Intraspinal stimulator in cervical region with pulse generator in right mid abdomen. Normal heart size, mediastinal contours, and pulmonary vascularity. Chronic peribronchial thickening. No acute infiltrate, pleural effusion or pneumothorax. Increased stool throughout colon. No evidence of bowel obstruction, bowel wall thickening, or free intraperitoneal air. Bones appear demineralized. Surgical clips right quadrant question cholecystectomy.  IMPRESSION: Chronic bronchitic changes. Increased stool in colon.   Original Report Authenticated By: Ulyses Southward, M.D.      Assessment and Plan:  SUKHMAN KOCHER is a 47 y.o. year old female presenting with SBO  GI: NPO. NG/OG tube placement attempt unsuccessful x 3 in ER. Will consult surgery in am. Clinically follow. Continue IBS regimen. Dilaudid for pain. Antiemetics  CV: Elevated BP. Pain likely contributing. Continue home regimen. Prn hydralazine.   Endocrine: SSI. A1c         Hypercalcemia: Checking ionized calcium in setting of hyperparathyroidism. IVF and reassess. Pending outpt endocrine follow up per pt.   Neuro: baseline chronic pain in setting of reflex sympathetic dystrophy and degenerative disc disease. Dilaudid for pain.   FEN: NPO Prophylaxis: sub q heparin Disposition: pending further evaluation  Code Status:full code        Doree Albee MD  Pager: [phone]

## 2012-12-21 NOTE — Consult Note (Signed)
Emma Munoz 1965-09-13  [id number].    Requesting MD: Dr. Brien Few Chief Complaint/Reason for Consult: SBO from anastomosis stricture HPI:  47 y.o. year old female with PMH of 1* hyperparathyroidism, DM-2,  reflex sympathetic dystrophy, GERD, gastroparesis, IBS, recurrent SBO s/p resection, presenting with SBO. Patient states that she has intermittent mild abdominal pain on regular basis. However, she developed acute onset of severe abdominal pain as well as nausea beginning early 12/20/12. Patient states that these issues initially started as a postoperative complication from cholecystectomy where she had a an accidental "nicking" of her bowel, that led to secondary sepsis (Dr. Lemar Livings in 11/2011).  Patient states she had multiple episodes of abdominal pain immediately post-prandial which is alleviated by lying down and time. She eventually had a hernia repair and bowel resection for anastomotic stricture electively by Dr. Romie Levee 04/2012, and her symptoms have been much improved since this point.  She currently complains of constant central and lower abdominal pain with nausea/vomiting.  Denies any other complaints.  In the ER, lab work was generally within normal limits. However, a CT scan of the abdomen was obtained showing a small bowel obstruction at the area of prior anastomosis.    ROS: All systems reviewed and otherwise negative except for as above  Family History  Problem Relation Age of Onset  . Early death Father 55    GUNSHOT  . Kidney disease Brother     s/p transplant  . Heart disease Maternal Grandmother     Heart defect  . Prostate cancer Maternal Grandfather   . Cancer Maternal Grandfather     prostate    Past Medical History  Diagnosis Date  . Hypertension   . IBS (irritable bowel syndrome)   . Heart murmur   . Hyperglycemia   . Tobacco abuse   . Type II diabetes mellitus     for 6 weeks only 09/2011-11/2011  . GERD (gastroesophageal reflux disease)   .  Gastroparesis   . Primary hyperparathyroidism   . Migraines   . Hyperlipidemia   . Metabolic syndrome X   . Obesity   . Anemia   . Depression   . Reflex sympathetic dystrophy   . Herniated cervical disc   . Insomnia   . Stenosis colon     s/p bowel resection for small bowel stenosis    Past Surgical History  Procedure Laterality Date  . Cesarean section  85, 87, 95  . Lumbar disc surgery  1993; 2000  . Ulnar nerve repair  1998    left "got my hand crushed"  . Myomectomy  1982-2005    "multiple; mostly w/scopes" (05/13/2012)  . Bowel resection  04/25/2012    Procedure: SMALL BOWEL RESECTION;  Surgeon: Romie Levee, MD;  Location: [facility] OR;  Service: General;  Laterality: N/A;  small bowell resection, incisional hernia repair  . Incisional hernia repair  04/25/2012    Procedure: HERNIA REPAIR INCISIONAL;  Surgeon: Romie Levee, MD;  Location: [facility] OR;  Service: General;  Laterality: N/A;  . Colon surgery  05/13/2012    small bowel resection w/IHR  . Cholecystectomy  11/2011    With complications  . Vaginal hysterectomy  2005  . Tubal ligation  1995  . Collateral ligament repair, knee  1987; 1990's    left  . Implantation vagal nerve stimulator  07/2011    "lower back" (05/13/2012)  . Shoulder arthroscopy w/ rotator cuff repair  ~ 2005    left  . Hernia repair  04/2012; 05/13/2012    incisional  . Back surgery    . Small intestine surgery  05/05/12    Social History:  reports that she has been smoking Cigarettes.  She has a 24 pack-year smoking history. She has never used smokeless tobacco. She reports that she does not drink alcohol or use illicit drugs.  Allergies:  Allergies  Allergen Reactions  . Aspirin Anaphylaxis    REACTION: Tongue, throat, extremities swell  . Dilaudid (Hydromorphone Hcl) Nausea And Vomiting  . Ibuprofen Anaphylaxis    REACTION: Tongue, throat, extremities swell  . Latex Hives  . Nsaids Anaphylaxis    REACTION: Tongue, throat, extremities swell  .  Gadolinium Derivatives Nausea And Vomiting and Other (See Comments)    Pt states nausea and vomiting to GAD in MRI pt states she does ok with iv/cm in cat scan    Medications Prior to Admission  Medication Sig Dispense Refill  . bifidobacterium infantis (ALIGN) capsule Take 1 capsule by mouth daily.  14 capsule  0  . cyclobenzaprine (FLEXERIL) 10 MG tablet Take 20 mg by mouth at bedtime.      . dicyclomine (BENTYL) 10 MG capsule Take 1 capsule (10 mg total) by mouth 3 (three) times daily before meals.  50 capsule  0  . esomeprazole (NEXIUM) 40 MG packet Take 40 mg by mouth daily before breakfast.  30 each  12  . Linaclotide (LINZESS) 290 MCG CAPS Take 290 mcg by mouth daily.  30 capsule  6  . lisinopril (PRINIVIL,ZESTRIL) 20 MG tablet Take 1 tablet (20 mg total) by mouth daily.  30 tablet  0  . metoprolol (LOPRESSOR) 100 MG tablet Take 1 tablet (100 mg total) by mouth daily.  30 tablet  6  . morphine (KADIAN) 60 MG 24 hr capsule Take 60 mg by mouth 2 (two) times daily.      Marland Kitchen morphine (MSIR) 30 MG tablet Take 3 tablets (90 mg total) by mouth every 4 (four) hours as needed. For breakthrough pain  120 tablet  0  . polyethylene glycol powder (GLYCOLAX/MIRALAX) powder Take 17 g by mouth daily.  255 g  3    Blood pressure 150/110, pulse 79, temperature 97.9 F (36.6 C), temperature source Oral, resp. rate 18, height 5\' 7"  (1.702 m), weight 94.4 kg (208 lb 1.8 oz), SpO2 96.00%. Physical Exam: General: pleasant, WD/WN AA female who is laying in bed in NAD HEENT: head is normocephalic, atraumatic.  Sclera are noninjected.  PERRL.  Ears and nose without any masses or lesions.  Mouth is pink and moist Heart: regular, rate, and rhythm.  No obvious murmurs, gallops, or rubs noted.  Palpable pedal pulses bilaterally Lungs: CTAB, no wheezes, rhonchi, or rales noted.  Respiratory effort nonlabored Abd: soft, mildly distended, tender especially in the lower abdomen and around the umbilicus, diminished BS,  recurrent ventral hernia on right side of midline abdomen MS: all 4 extremities are symmetrical with no cyanosis, clubbing, or edema. Skin: warm and dry with no masses, lesions, or rashes Psych: A&Ox3 with an appropriate affect.  Results for orders placed during the hospital encounter of 12/20/12 (from the past 48 hour(s))  CBC WITH DIFFERENTIAL     Status: Abnormal   Collection Time    12/20/12  4:37 PM      Result Value Range   WBC 9.0  4.0 - 10.5 K/uL   RBC 5.94 (*) 3.87 - 5.11 MIL/uL   Hemoglobin 17.1 (*) 12.0 - 15.0 g/dL  HCT 46.8 (*) 36.0 - 46.0 %   MCV 78.8  78.0 - 100.0 fL   MCH 28.8  26.0 - 34.0 pg   MCHC 36.5 (*) 30.0 - 36.0 g/dL   RDW 45.4  09.8 - 11.9 %   Platelets 320  150 - 400 K/uL   Neutrophils Relative 63  43 - 77 %   Neutro Abs 5.7  1.7 - 7.7 K/uL   Lymphocytes Relative 29  12 - 46 %   Lymphs Abs 2.6  0.7 - 4.0 K/uL   Monocytes Relative 6  3 - 12 %   Monocytes Absolute 0.6  0.1 - 1.0 K/uL   Eosinophils Relative 1  0 - 5 %   Eosinophils Absolute 0.1  0.0 - 0.7 K/uL   Basophils Relative 0  0 - 1 %   Basophils Absolute 0.0  0.0 - 0.1 K/uL  COMPREHENSIVE METABOLIC PANEL     Status: Abnormal   Collection Time    12/20/12  4:37 PM      Result Value Range   Sodium 136  135 - 145 mEq/L   Potassium 3.8  3.5 - 5.1 mEq/L   Chloride 100  96 - 112 mEq/L   CO2 25  19 - 32 mEq/L   Glucose, Bld 112 (*) 70 - 99 mg/dL   BUN 7  6 - 23 mg/dL   Creatinine, Ser 1.47  0.50 - 1.10 mg/dL   Calcium 82.9 (*) 8.4 - 10.5 mg/dL   Total Protein 8.1  6.0 - 8.3 g/dL   Albumin 4.0  3.5 - 5.2 g/dL   AST 20  0 - 37 U/L   ALT 19  0 - 35 U/L   Alkaline Phosphatase 108  39 - 117 U/L   Total Bilirubin 0.6  0.3 - 1.2 mg/dL   GFR calc non Af Amer >90  >90 mL/min   GFR calc Af Amer >90  >90 mL/min   Comment:            The eGFR has been calculated     using the CKD EPI equation.     This calculation has not been     validated in all clinical     situations.     eGFR's persistently      <90 mL/min signify     possible Chronic Kidney Disease.  LIPASE, BLOOD     Status: None   Collection Time    12/20/12  4:37 PM      Result Value Range   Lipase 11  11 - 59 U/L  URINALYSIS, ROUTINE W REFLEX MICROSCOPIC     Status: Abnormal   Collection Time    12/20/12  6:31 PM      Result Value Range   Color, Urine AMBER (*) YELLOW   Comment: BIOCHEMICALS MAY BE AFFECTED BY COLOR   APPearance CLOUDY (*) CLEAR   Specific Gravity, Urine 1.029  1.005 - 1.030   pH 5.5  5.0 - 8.0   Glucose, UA NEGATIVE  NEGATIVE mg/dL   Hgb urine dipstick NEGATIVE  NEGATIVE   Bilirubin Urine MODERATE (*) NEGATIVE   Ketones, ur 15 (*) NEGATIVE mg/dL   Protein, ur 562 (*) NEGATIVE mg/dL   Urobilinogen, UA 1.0  0.0 - 1.0 mg/dL   Nitrite NEGATIVE  NEGATIVE   Leukocytes, UA NEGATIVE  NEGATIVE  URINE MICROSCOPIC-ADD ON     Status: Abnormal   Collection Time    12/20/12  6:31 PM  Result Value Range   Squamous Epithelial / LPF MANY (*) RARE   Urine-Other MUCOUS PRESENT    GLUCOSE, CAPILLARY     Status: Abnormal   Collection Time    12/21/12  3:22 AM      Result Value Range   Glucose-Capillary 162 (*) 70 - 99 mg/dL  COMPREHENSIVE METABOLIC PANEL     Status: Abnormal   Collection Time    12/21/12  6:30 AM      Result Value Range   Sodium 135  135 - 145 mEq/L   Potassium 3.9  3.5 - 5.1 mEq/L   Chloride 101  96 - 112 mEq/L   CO2 26  19 - 32 mEq/L   Glucose, Bld 114 (*) 70 - 99 mg/dL   BUN 9  6 - 23 mg/dL   Creatinine, Ser 4.54  0.50 - 1.10 mg/dL   Calcium 09.8  8.4 - 11.9 mg/dL   Total Protein 7.5  6.0 - 8.3 g/dL   Albumin 3.6  3.5 - 5.2 g/dL   AST 27  0 - 37 U/L   ALT 24  0 - 35 U/L   Alkaline Phosphatase 101  39 - 117 U/L   Total Bilirubin 0.6  0.3 - 1.2 mg/dL   GFR calc non Af Amer >90  >90 mL/min   GFR calc Af Amer >90  >90 mL/min   Comment:            The eGFR has been calculated     using the CKD EPI equation.     This calculation has not been     validated in all clinical      situations.     eGFR's persistently     <90 mL/min signify     possible Chronic Kidney Disease.  CBC     Status: Abnormal   Collection Time    12/21/12  6:30 AM      Result Value Range   WBC 13.0 (*) 4.0 - 10.5 K/uL   RBC 5.77 (*) 3.87 - 5.11 MIL/uL   Hemoglobin 16.2 (*) 12.0 - 15.0 g/dL   HCT 14.7 (*) 82.9 - 56.2 %   MCV 79.9  78.0 - 100.0 fL   MCH 28.1  26.0 - 34.0 pg   MCHC 35.1  30.0 - 36.0 g/dL   RDW 13.0  86.5 - 78.4 %   Platelets 287  150 - 400 K/uL   Ct Abdomen Pelvis W Contrast  12/20/2012  *RADIOLOGY REPORT*  Clinical Data: Right lower quadrant tenderness, periumbilical and supraumbilical abdominal pain, nausea, vomiting, constipation, abdominal distention, past history small bowel obstruction with bowel surgery, hysterectomy, cholecystectomy, incisional hernia repair, hypertension, smoking, diabetes  CT ABDOMEN AND PELVIS WITH CONTRAST  Technique:  Multidetector CT imaging of the abdomen and pelvis was performed following the standard protocol during bolus administration of intravenous contrast. Sagittal and coronal MPR images reconstructed from axial data set.  Contrast: OMNIPAQUE IOHEXOL 300 MG/ML  SOLN Dilute oral contrast.  Comparison: 07/16/2012  Findings: Mild dependent atelectasis at bilateral lung bases. 4 mm diameter nonobstructing calculus lower pole right kidney image 33. Liver, spleen, pancreas, kidneys, and adrenal glands otherwise normal appearance. Normal appendix. Small amount of nonspecific low attenuation free pelvic fluid.  Dilated small bowel loops in mid abdomen with a small bowel stool sign. These extend to a small bowel anastomotic staple line in mid abdomen image 39 with decompressed small bowel beyond this site consistent with an anastomotic stricture with small  bowel obstruction. Minimal hazy edema of the associated mesentery. Stomach and colon decompressed.  Uterus surgically absent with normal sized right ovary. Mildly enlarged but stable left ovary  containing a small cyst. Unremarkable bladder and ureters. No mass, adenopathy, or free air. Small ventral hernia containing fat at mid abdomen image 43. No acute osseous findings  IMPRESSION: Mid small bowel obstruction located at a small bowel anastomosis in the mid abdomen consistent with an anastomotic stricture. 4 mm nonobstructing calculus lower pole right kidney. Small amount of nonspecific free intraperitoneal fluid.   Original Report Authenticated By: Ulyses Southward, M.D.    Dg Abd Acute W/chest  12/20/2012  *RADIOLOGY REPORT*  Clinical Data: Chest and abdominal pain, constipation, hypertension, history small bowel obstruction and smoking  ACUTE ABDOMEN SERIES (ABDOMEN 2 VIEW & CHEST 1 VIEW)  Comparison: 10/10/2012 abdominal radiographs, chest radiograph 04/24/2012  Findings: Intraspinal stimulator in cervical region with pulse generator in right mid abdomen. Normal heart size, mediastinal contours, and pulmonary vascularity. Chronic peribronchial thickening. No acute infiltrate, pleural effusion or pneumothorax. Increased stool throughout colon. No evidence of bowel obstruction, bowel wall thickening, or free intraperitoneal air. Bones appear demineralized. Surgical clips right quadrant question cholecystectomy.  IMPRESSION: Chronic bronchitic changes. Increased stool in colon.   Original Report Authenticated By: Ulyses Southward, M.D.        Assessment/Plan SBO, unclear possible anastomosis stricture vs adhesive band Diffuse abdominal pain Nausea/vomiting Dehydration Leukocytosis 1.  Conservative management for now:  NPO, NG tube, IVF, pain control, antiemetics 2.  Will discuss the case with Dr. Maisie Fus because this is her private patient 3.  SBFT or CT enterography may be indicated for better evaluation, will discuss with Dr. Maisie Fus & Dr. Janee Morn 4.  Ambulate and IS 5.  SCD's and heparin 6.  Will follow with you  DORT, Jeweldean Drohan 12/21/2012, 11:54 AM Pager: [phone]

## 2012-12-22 ENCOUNTER — Inpatient Hospital Stay (HOSPITAL_COMMUNITY): Payer: Medicare Other

## 2012-12-22 ENCOUNTER — Encounter (HOSPITAL_COMMUNITY): Payer: Self-pay | Admitting: General Practice

## 2012-12-22 DIAGNOSIS — IMO0001 Reserved for inherently not codable concepts without codable children: Secondary | ICD-10-CM

## 2012-12-22 DIAGNOSIS — R109 Unspecified abdominal pain: Secondary | ICD-10-CM

## 2012-12-22 DIAGNOSIS — E209 Hypoparathyroidism, unspecified: Secondary | ICD-10-CM

## 2012-12-22 DIAGNOSIS — I10 Essential (primary) hypertension: Secondary | ICD-10-CM

## 2012-12-22 LAB — GLUCOSE, CAPILLARY: Glucose-Capillary: 99 mg/dL (ref 70–99)

## 2012-12-22 LAB — CBC
Hemoglobin: 16 g/dL — ABNORMAL HIGH (ref 12.0–15.0)
MCHC: 35.1 g/dL (ref 30.0–36.0)
WBC: 17.2 10*3/uL — ABNORMAL HIGH (ref 4.0–10.5)

## 2012-12-22 LAB — COMPREHENSIVE METABOLIC PANEL
ALT: 20 U/L (ref 0–35)
Albumin: 4 g/dL (ref 3.5–5.2)
Alkaline Phosphatase: 104 U/L (ref 39–117)
GFR calc Af Amer: >90 mL/min (ref >90)
Glucose, Bld: 97 mg/dL (ref 70–99)
Potassium: 3.9 mEq/L (ref 3.5–5.1)
Sodium: 134 mEq/L — ABNORMAL LOW (ref 135–145)
Total Protein: 7.8 g/dL (ref 6.0–8.3)

## 2012-12-22 MED ORDER — ENALAPRILAT 1.25 MG/ML IV SOLN
1.2500 mg | Freq: Four times a day (QID) | INTRAVENOUS | Status: DC
  Administered 2012-12-22 – 2012-12-24 (×8): 1.25 mg via INTRAVENOUS
  Filled 2012-12-22 (×13): qty 1

## 2012-12-22 MED ORDER — MORPHINE SULFATE 2 MG/ML IJ SOLN
1.0000 mg | INTRAMUSCULAR | Status: DC | PRN
  Administered 2012-12-22 – 2012-12-24 (×9): 4 mg via INTRAVENOUS
  Filled 2012-12-22 (×9): qty 2

## 2012-12-22 MED ORDER — CLONIDINE HCL 0.2 MG/24HR TD PTWK: 0.2000 mg | MEDICATED_PATCH | TRANSDERMAL | Status: DC

## 2012-12-22 MED ORDER — FENTANYL 50 MCG/HR TD PT72
50.0000 ug | MEDICATED_PATCH | TRANSDERMAL | Status: DC
  Administered 2012-12-22: 50 ug via TRANSDERMAL
  Filled 2012-12-22: qty 1

## 2012-12-22 NOTE — Progress Notes (Signed)
C/o of nausea after each Dilaudid injection,did vomit x1, also having cold sweats , headaches and increased anxiety

## 2012-12-22 NOTE — Progress Notes (Signed)
Patient seen and examined. Still with some abd pain. No gas/BM yet. Abd soft but notes some tenderness, but no rebound, few BS. KUB today shows no dilated sb, most of contrast is in the colon.  Source of abd pain is unclear.May have a narrowing at anastomosis per CT, but has allowed contrast to move through. Will need to review situation with Dr Maisie Fus, who knows her from prior surgery

## 2012-12-22 NOTE — Progress Notes (Signed)
[facility] PROGRESS NOTE  TYRONICA TRUXILLO WUJ:[id number] DOB: Feb 09, 1966 DOA: 12/20/2012 PCP: Wynona Dove, MD  Assessment/Plan: Active Problems:   * No active hospital problems. *     1. 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 Abdominal/pelvic CT demonstrated mid small bowel obstruction located at a small bowel anastomosis in the mid abdomen consistent with an anastomotic stricture. Managing with bowel rest, NG-T, ivi fluids. Dr Gaynelle Adu provided surgical consultation, and conservative management is recommended. Dr Romie Levee' input is anticipated today 2. HTN. BP remains uncontrolled, on Clonidine patch, iv Lopressor and prn Hydralazine. Will add iv Enalaprilat. . Pain likely contributing. On Prn Hydralazine, but will add Clonidine patch, as patient is NPO.  3. DM-2. Per patient this is diet-controlled, and has been under control, since she lost a significant amount of weight. Not on diabetic medication at this time. HBA1C is 5.5. Patient has declined SSI at this time and is euglycemic.    4. Hypercalcemia: Calcium was 10.9 at presentation, and has dropped to 10.4 overnight, following hydration. Patient does have a known history of primary  hyperparathyroidism. Following levels.  5. Chronic pain: In setting of reflex sympathetic dystrophy and degenerative disc disease. Not problematic at this time.    Code Status: Full Code.  Family Communication:  Disposition Plan: To be determined.    Brief narrative: 47 y.o. year old female with history of primary hyperparathyroidism, DM-2, obesity, metabolic syndrome-X, dyslipidemia, chronic anemia, HTN, allergic rhinitis, reflex sympathetic dystrophy, GERD, gastroparesis, herniated cervical disk, depression, tobacco abuse,  migraines, IBS, recurrent small bowel instructions status post resection, presenting with small bowel obstruction. Patient states that she has intermittent mild abdominal pain on regular basis. However, she developed acute onset of severe abdominal pain as well as nausea beginning early 12/20/12. Patient states that these issues initially started as a postoperative complication from cholecystectomy where she had a an accidental "nicking" of her bowel, that led to secondary sepsis. Patient states she had multiple episodes of small bowel obstruction subsequent to a septic episode. She eventually had obstruction repaired by Dr. Romie Levee, in 04/2012 and her symptoms have been much improved since this point. Patient denies any recent changes in her diet. No recent strenuous activity. Patient states she has been under some stress.  In the ER, lab work was generally within normal limits. However, a CT scan of the abdomen was obtained showing a small bowel obstruction at the area of prior anastomosis. Admitted for further management.    Consultants:  Surgery.   Procedures:  CXR/Abdominal X-Ray.   CT abdomen/pelvic CT scan.   Antibiotics:  N/A.  HPI/Subjective: Still has abdominal pain. C/O headache.   Objective: Vital signs in last 24 hours: Temp:  [97.2 F (36.2 C)-99 F (37.2 C)] 99 F (37.2 C) (05/12 0626) Pulse Rate:  [68-90] 90 (05/12 0626) Resp:  [17-18] 18 (05/12 0626) BP: (152-201)/(58-112) 172/106 mmHg (05/12 0626) SpO2:  [97 %-98 %] 98 % (05/12 0626) Weight:  [95.2 kg (209 lb 14.1 oz)] 95.2 kg (209 lb 14.1 oz) (05/12 0626) Weight change: 1.759 kg (3 lb 14.1 oz)    Intake/Output from previous day: 05/11 0701 - 05/12 0700 In: -  Out: 1000 [Emesis/NG output:1000]     Physical Exam: General: Alert, communicative, fully oriented, not short of breath at rest. NG-T draining. Hydration appears  fair. HEENT:  No clinical pallor, no jaundice, no conjunctival injection or  discharge. NECK:  Supple, JVP not seen, no carotid bruits, no palpable lymphadenopathy, no palpable goiter. CHEST:  Clinically clear to auscultation, no wheezes, no crackles. HEART:  Sounds 1 and 2 heard, normal, regular, no murmurs. ABDOMEN:  Full, mildly distended, tender in central abdomen, bowel sounds heard. Midline laparotomy scar noted. No palpable organomegaly, no palpable masses, normal bowel sounds. GENITALIA:  Not examined. LOWER EXTREMITIES:  No pitting edema, palpable peripheral pulses. MUSCULOSKELETAL SYSTEM:  Un remarkable. CENTRAL NERVOUS SYSTEM:  No focal neurologic deficit on gross examination.  Lab Results:  Recent Labs  12/21/12 0630 12/22/12 0500  WBC 13.0* 17.2*  HGB 16.2* 16.0*  HCT 46.1* 45.6  PLT 287 279    Recent Labs  12/21/12 0630 12/22/12 0500  NA 135 134*  K 3.9 3.9  CL 101 101  CO2 26 22  GLUCOSE 114* 97  BUN 9 5*  CREATININE 0.65 0.52  CALCIUM 10.4 11.1*   No results found for this or any previous visit (from the past 240 hour(s)).   Studies/Results: Ct Abdomen Pelvis W Contrast  12/20/2012  *RADIOLOGY REPORT*  Clinical Data: Right lower quadrant tenderness, periumbilical and supraumbilical abdominal pain, nausea, vomiting, constipation, abdominal distention, past history small bowel obstruction with bowel surgery, hysterectomy, cholecystectomy, incisional hernia repair, hypertension, smoking, diabetes  CT ABDOMEN AND PELVIS WITH CONTRAST  Technique:  Multidetector CT imaging of the abdomen and pelvis was performed following the standard protocol during bolus administration of intravenous contrast. Sagittal and coronal MPR images reconstructed from axial data set.  Contrast: OMNIPAQUE IOHEXOL 300 MG/ML  SOLN Dilute oral contrast.  Comparison: 07/16/2012  Findings: Mild dependent atelectasis at bilateral lung bases. 4 mm diameter nonobstructing calculus lower pole right kidney image 33. Liver, spleen, pancreas, kidneys, and adrenal glands  otherwise normal appearance. Normal appendix. Small amount of nonspecific low attenuation free pelvic fluid.  Dilated small bowel loops in mid abdomen with a small bowel stool sign. These extend to a small bowel anastomotic staple line in mid abdomen image 39 with decompressed small bowel beyond this site consistent with an anastomotic stricture with small bowel obstruction. Minimal hazy edema of the associated mesentery. Stomach and colon decompressed.  Uterus surgically absent with normal sized right ovary. Mildly enlarged but stable left ovary containing a small cyst. Unremarkable bladder and ureters. No mass, adenopathy, or free air. Small ventral hernia containing fat at mid abdomen image 43. No acute osseous findings  IMPRESSION: Mid small bowel obstruction located at a small bowel anastomosis in the mid abdomen consistent with an anastomotic stricture. 4 mm nonobstructing calculus lower pole right kidney. Small amount of nonspecific free intraperitoneal fluid.   Original Report Authenticated By: Ulyses Southward, M.D.    Dg Abd Acute W/chest  12/20/2012  *RADIOLOGY REPORT*  Clinical Data: Chest and abdominal pain, constipation, hypertension, history small bowel obstruction and smoking  ACUTE ABDOMEN SERIES (ABDOMEN 2 VIEW & CHEST 1 VIEW)  Comparison: 10/10/2012 abdominal radiographs, chest radiograph 04/24/2012  Findings: Intraspinal stimulator in cervical region with pulse generator in right mid abdomen. Normal heart size, mediastinal contours, and pulmonary vascularity. Chronic peribronchial thickening. No acute infiltrate, pleural effusion or pneumothorax. Increased stool throughout colon. No evidence of bowel obstruction, bowel wall thickening, or free intraperitoneal air. Bones appear demineralized. Surgical clips right quadrant question cholecystectomy.  IMPRESSION: Chronic bronchitic changes. Increased stool in colon.   Original Report Authenticated By: Ulyses Southward, M.D.    Dg  Abd Portable  1v  12/22/2012  *RADIOLOGY REPORT*  Clinical Data: 47 year old female with small bowel obstruction.  PORTABLE ABDOMEN - 1 VIEW  Comparison: 12/20/2012 and earlier.  Findings: Portable AP supine view 0557 hours. Oral contrast has reached the colon is seen throughout the colon to the level of the distal descending segment.  No dilated bowel loops are evident.  Enteric tube in place, tip at the level of the proximal duodenum.  Stable right upper quadrant surgical clips. Stable right lower abdomen or pelvis generator device with lead extending up into the thoracic spine.  IMPRESSION: Oral contrast has reached the colon.  The bowel gas pattern is nonobstructed.  Enteric tube in place, tip at the level of the second portion of the duodenum.   Original Report Authenticated By: Erskine Speed, M.D.     Medications: Scheduled Meds: . cloNIDine  0.1 mg Transdermal Weekly  . heparin  5,000 Units Subcutaneous Q8H  . metoprolol  10 mg Intravenous Q6H  . pantoprazole (PROTONIX) IV  40 mg Intravenous QHS   Continuous Infusions: . 0.9 % NaCl with KCl 40 mEq / L 100 mL/hr at 12/21/12 1304   PRN Meds:.acetaminophen, hydrALAZINE, morphine injection, ondansetron (ZOFRAN) IV, ondansetron, sodium chloride    LOS: 2 days   Aragorn Recker,CHRISTOPHER  [facility] Pager [phone]. If 8PM-8AM, please contact night-coverage at [email or web address], password [facility] 12/22/2012, 9:05 AM  LOS: 2 days

## 2012-12-22 NOTE — Progress Notes (Signed)
Patient ID: Emma Munoz, female   DOB: [birth year], 47 y.o.   MRN: [id number] I D/W Dr. Maisie Fus.  She will F/U in AM. No significant functional obstruction in light of plain films today.  Chronic narcotic use is an issue. Violeta Gelinas, MD, MPH, FACS Pager: [phone]

## 2012-12-22 NOTE — Progress Notes (Signed)
Subjective: Pt states pain is mildly improved in abdomen, still significantly tender.  Pt denies flatus or BM.  Pt was vomiting while NG was in because of the dilaudid.  Pt thinks she's having withdrawals from not having her home morphine.  Pt able to ambulate some.    Objective: Vital signs in last 24 hours: Temp:  [97.2 F (36.2 C)-99 F (37.2 C)] 99 F (37.2 C) (05/12 0626) Pulse Rate:  [68-90] 90 (05/12 0626) Resp:  [17-18] 18 (05/12 0626) BP: (152-201)/(58-112) 172/106 mmHg (05/12 0626) SpO2:  [97 %-98 %] 98 % (05/12 0626) Weight:  [95.2 kg (209 lb 14.1 oz)] 95.2 kg (209 lb 14.1 oz) (05/12 0626)    Intake/Output from previous day: 05/11 0701 - 05/12 0700 In: -  Out: 1000 [Emesis/NG output:1000] Intake/Output this shift:    PE: Gen:  Alert, NAD, pleasant Abd: Soft, moderately tender, no peritoneal signs, ND, diminished BS, no HSM, lower midline incision    Lab Results:   Recent Labs  12/21/12 0630 12/22/12 0500  WBC 13.0* 17.2*  HGB 16.2* 16.0*  HCT 46.1* 45.6  PLT 287 279   BMET  Recent Labs  12/20/12 1637 12/21/12 0630  NA 136 135  K 3.8 3.9  CL 100 101  CO2 25 26  GLUCOSE 112* 114*  BUN 7 9  CREATININE 0.59 0.65  CALCIUM 10.9* 10.4   PT/INR No results found for this basename: LABPROT, INR,  in the last 72 hours CMP     Component Value Date/Time   NA 135 12/21/2012 0630   K 3.9 12/21/2012 0630   CL 101 12/21/2012 0630   CO2 26 12/21/2012 0630   GLUCOSE 114* 12/21/2012 0630   BUN 9 12/21/2012 0630   CREATININE 0.65 12/21/2012 0630   CREATININE 0.62 04/16/2012 1419   CALCIUM 10.4 12/21/2012 0630   CALCIUM 11.0* 12/21/2011 1432   PROT 7.5 12/21/2012 0630   ALBUMIN 3.6 12/21/2012 0630   AST 27 12/21/2012 0630   ALT 24 12/21/2012 0630   ALKPHOS 101 12/21/2012 0630   BILITOT 0.6 12/21/2012 0630   GFRNONAA >90 12/21/2012 0630   GFRAA >90 12/21/2012 0630   Lipase     Component Value Date/Time   LIPASE 11 12/20/2012 1637       Studies/Results: Ct  Abdomen Pelvis W Contrast  12/20/2012  *RADIOLOGY REPORT*  Clinical Data: Right lower quadrant tenderness, periumbilical and supraumbilical abdominal pain, nausea, vomiting, constipation, abdominal distention, past history small bowel obstruction with bowel surgery, hysterectomy, cholecystectomy, incisional hernia repair, hypertension, smoking, diabetes  CT ABDOMEN AND PELVIS WITH CONTRAST  Technique:  Multidetector CT imaging of the abdomen and pelvis was performed following the standard protocol during bolus administration of intravenous contrast. Sagittal and coronal MPR images reconstructed from axial data set.  Contrast: OMNIPAQUE IOHEXOL 300 MG/ML  SOLN Dilute oral contrast.  Comparison: 07/16/2012  Findings: Mild dependent atelectasis at bilateral lung bases. 4 mm diameter nonobstructing calculus lower pole right kidney image 33. Liver, spleen, pancreas, kidneys, and adrenal glands otherwise normal appearance. Normal appendix. Small amount of nonspecific low attenuation free pelvic fluid.  Dilated small bowel loops in mid abdomen with a small bowel stool sign. These extend to a small bowel anastomotic staple line in mid abdomen image 39 with decompressed small bowel beyond this site consistent with an anastomotic stricture with small bowel obstruction. Minimal hazy edema of the associated mesentery. Stomach and colon decompressed.  Uterus surgically absent with normal sized right ovary. Mildly enlarged but stable  left ovary containing a small cyst. Unremarkable bladder and ureters. No mass, adenopathy, or free air. Small ventral hernia containing fat at mid abdomen image 43. No acute osseous findings  IMPRESSION: Mid small bowel obstruction located at a small bowel anastomosis in the mid abdomen consistent with an anastomotic stricture. 4 mm nonobstructing calculus lower pole right kidney. Small amount of nonspecific free intraperitoneal fluid.   Original Report Authenticated By: Ulyses Southward, M.D.     Dg Abd Acute W/chest  12/20/2012  *RADIOLOGY REPORT*  Clinical Data: Chest and abdominal pain, constipation, hypertension, history small bowel obstruction and smoking  ACUTE ABDOMEN SERIES (ABDOMEN 2 VIEW & CHEST 1 VIEW)  Comparison: 10/10/2012 abdominal radiographs, chest radiograph 04/24/2012  Findings: Intraspinal stimulator in cervical region with pulse generator in right mid abdomen. Normal heart size, mediastinal contours, and pulmonary vascularity. Chronic peribronchial thickening. No acute infiltrate, pleural effusion or pneumothorax. Increased stool throughout colon. No evidence of bowel obstruction, bowel wall thickening, or free intraperitoneal air. Bones appear demineralized. Surgical clips right quadrant question cholecystectomy.  IMPRESSION: Chronic bronchitic changes. Increased stool in colon.   Original Report Authenticated By: Ulyses Southward, M.D.    Dg Abd Portable 1v  12/22/2012  *RADIOLOGY REPORT*  Clinical Data: 47 year old female with small bowel obstruction.  PORTABLE ABDOMEN - 1 VIEW  Comparison: 12/20/2012 and earlier.  Findings: Portable AP supine view 0557 hours. Oral contrast has reached the colon is seen throughout the colon to the level of the distal descending segment.  No dilated bowel loops are evident.  Enteric tube in place, tip at the level of the proximal duodenum.  Stable right upper quadrant surgical clips. Stable right lower abdomen or pelvis generator device with lead extending up into the thoracic spine.  IMPRESSION: Oral contrast has reached the colon.  The bowel gas pattern is nonobstructed.  Enteric tube in place, tip at the level of the second portion of the duodenum.   Original Report Authenticated By: Erskine Speed, M.D.     Anti-infectives: Anti-infectives   None       Assessment/Plan SBO, unclear possible anastomosis stricture vs adhesive band - mildly improved Diffuse abdominal pain  Nausea/vomiting  Leukocytosis - worse today 1. Conservative  management for now: NPO, NG tube, IVF, pain control, antiemetics  2. Will discuss the case with Dr. Maisie Fus because this is her private patient  3. SBFT or CT enterography may be indicated for better evaluation, will discuss with Dr. Maisie Fus & Dr. Janee Morn vs. Ex lap for worsening leukocytosis 4. Ambulate and IS  5. SCD's and heparin  6. Change from dilaudid (causing vomiting) to morphine 7.  Will follow with you     LOS: 2 days    DORT, Aundra Millet 12/22/2012, 8:07 AM Pager: [phone]

## 2012-12-23 DIAGNOSIS — F3289 Other specified depressive episodes: Secondary | ICD-10-CM

## 2012-12-23 DIAGNOSIS — F329 Major depressive disorder, single episode, unspecified: Secondary | ICD-10-CM

## 2012-12-23 LAB — CBC
MCH: 28.4 pg (ref 26.0–34.0)
MCV: 80.1 fL (ref 78.0–100.0)
Platelets: 283 10*3/uL (ref 150–400)
RBC: 5.59 MIL/uL — ABNORMAL HIGH (ref 3.87–5.11)

## 2012-12-23 LAB — COMPREHENSIVE METABOLIC PANEL
CO2: 26 mEq/L (ref 19–32)
Calcium: 11.9 mg/dL — ABNORMAL HIGH (ref 8.4–10.5)
Creatinine, Ser: 0.66 mg/dL (ref 0.50–1.10)
GFR calc Af Amer: >90 mL/min (ref >90)
GFR calc non Af Amer: >90 mL/min (ref >90)
Glucose, Bld: 83 mg/dL (ref 70–99)

## 2012-12-23 LAB — GLUCOSE, CAPILLARY: Glucose-Capillary: 93 mg/dL (ref 70–99)

## 2012-12-23 MED ORDER — BISACODYL 10 MG RE SUPP: 10.0000 mg | Freq: Every day | RECTAL | Status: DC | PRN

## 2012-12-23 MED ORDER — DOCUSATE SODIUM 100 MG PO CAPS
200.0000 mg | ORAL_CAPSULE | Freq: Two times a day (BID) | ORAL | Status: DC
  Administered 2012-12-23 – 2012-12-24 (×2): 200 mg via ORAL
  Filled 2012-12-23 (×2): qty 1

## 2012-12-23 MED ORDER — DOCUSATE SODIUM 100 MG PO CAPS
100.0000 mg | ORAL_CAPSULE | Freq: Two times a day (BID) | ORAL | Status: DC
  Administered 2012-12-23: 100 mg via ORAL
  Filled 2012-12-23: qty 1

## 2012-12-23 NOTE — Progress Notes (Signed)
Here with small bowel anastomotic stricture.      Subjective:Ng fell out last night.  Pt feels better.  Her pain is minimal.    Objective: Vital signs in last 24 hours: Temp:  [98.3 F (36.8 C)-99 F (37.2 C)] 98.3 F (36.8 C) (05/13 0615) Pulse Rate:  [85-105] 85 (05/13 0615) Resp:  [20] 20 (05/13 0615) BP: (148-168)/(100-111) 152/106 mmHg (05/13 0615) SpO2:  [99 %-100 %] 100 % (05/13 0615) Weight:  [206 lb 9.1 oz (93.7 kg)] 206 lb 9.1 oz (93.7 kg) (05/13 0500) Last BM Date: 12/19/12  Intake/Output from previous day: 05/12 0701 - 05/13 0700 In: 0  Out: 750 [Emesis/NG output:750] Intake/Output this shift:    General appearance: alert and cooperative GI: normal findings: soft, non-tender  Lab Results:  Results for orders placed during the hospital encounter of 12/20/12 (from the past 24 hour(s))  GLUCOSE, CAPILLARY     Status: None   Collection Time    12/22/12 12:09 PM      Result Value Range   Glucose-Capillary 96  70 - 99 mg/dL   Comment 1 Notify RN    GLUCOSE, CAPILLARY     Status: None   Collection Time    12/22/12  3:57 PM      Result Value Range   Glucose-Capillary 90  70 - 99 mg/dL   Comment 1 Notify RN    GLUCOSE, CAPILLARY     Status: None   Collection Time    12/22/12  7:42 PM      Result Value Range   Glucose-Capillary 98  70 - 99 mg/dL  CBC     Status: Abnormal   Collection Time    12/23/12  5:08 AM      Result Value Range   WBC 10.5  4.0 - 10.5 K/uL   RBC 5.59 (*) 3.87 - 5.11 MIL/uL   Hemoglobin 15.9 (*) 12.0 - 15.0 g/dL   HCT 16.1  09.6 - 04.5 %   MCV 80.1  78.0 - 100.0 fL   MCH 28.4  26.0 - 34.0 pg   MCHC 35.5  30.0 - 36.0 g/dL   RDW 40.9  81.1 - 91.4 %   Platelets 283  150 - 400 K/uL  COMPREHENSIVE METABOLIC PANEL     Status: Abnormal   Collection Time    12/23/12  5:08 AM      Result Value Range   Sodium 138  135 - 145 mEq/L   Potassium 3.8  3.5 - 5.1 mEq/L   Chloride 101  96 - 112 mEq/L   CO2 26  19 - 32 mEq/L   Glucose, Bld 83  70  - 99 mg/dL   BUN 11  6 - 23 mg/dL   Creatinine, Ser 7.82  0.50 - 1.10 mg/dL   Calcium 95.6 (*) 8.4 - 10.5 mg/dL   Total Protein 7.7  6.0 - 8.3 g/dL   Albumin 3.8  3.5 - 5.2 g/dL   AST 20  0 - 37 U/L   ALT 19  0 - 35 U/L   Alkaline Phosphatase 94  39 - 117 U/L   Total Bilirubin 0.9  0.3 - 1.2 mg/dL   GFR calc non Af Amer >90  >90 mL/min   GFR calc Af Amer >90  >90 mL/min  GLUCOSE, CAPILLARY     Status: None   Collection Time    12/23/12  8:17 AM      Result Value Range   Glucose-Capillary 93  70 -  99 mg/dL   Comment 1 Notify RN       Studies/Results Radiology CT shows small bowel stricture.  AXR shows contrast flowing through to the colon    MEDS, Scheduled . [START ON 12/28/2012] cloNIDine  0.2 mg Transdermal Weekly  . enalaprilat  1.25 mg Intravenous Q6H  . fentaNYL  50 mcg Transdermal Q72H  . heparin  5,000 Units Subcutaneous Q8H  . metoprolol  10 mg Intravenous Q6H  . pantoprazole (PROTONIX) IV  40 mg Intravenous QHS     Assessment: <principal problem not specified> Small bowel stricture  Plan: It appears that Brynli has developed a stricture at her anastomosis again.  Obviously surgery doesn't seem to help her much but I see no other way to fix the problem.  She and Dr Rhea Belton have discussed double balloon enteroscopy and balloon dilation in the past, and he did not think there was any way to get down that far.  I offered her a stricturoplasty vs a liquid diet given her albumin levels are normal.  She would like to try the liquid diet for now.  I think if she tolerates this and starts to have bowel movements, she can be discharged on a liquid diet and follow up with me in the office.   LOS: 3 days    Vanita Panda, MD [facility], [place] [phone]   12/23/2012 9:13 AM

## 2012-12-23 NOTE — Progress Notes (Signed)
[facility] PROGRESS NOTE  Emma Munoz ZOX:[id number] DOB: 02-11-1966 DOA: 12/20/2012 PCP: Wynona Dove, MD  Assessment/Plan: Active Problems:   * No active hospital problems. *     1. 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 Abdominal/pelvic CT demonstrated mid small bowel obstruction located at a small bowel anastomosis in the mid abdomen consistent with an anastomotic stricture. Managed with bowel rest, NG-T, ivi fluids. Dr Gaynelle Adu provided surgical consultation, and conservative management is recommended. Dr Romie Levee saw patient on 12/23/12, and has discussed options in detail. She offered her a stricturoplasty vs a liquid diet, and patient would like to try the liquid diet for now. Observing. NG-T dislodged and fell out on 12/22/12, evening No vomiting so far.  2. HTN. BP was initially very high, at 201/112-172/106, and is being managed with Clonidine patch, iv Lopressor, iv Enalaprilat and prn Hydralazine, with significant, albeit sub-optimal control. Pre-admission antihypertensives will be reinstated, if patient tolerates POs.  3. DM-2. Per patient this is diet-controlled, and has been under control, since she lost a significant amount of weight. Not on diabetic medication at this time. HBA1C is 5.5. Patient has declined SSI at this time and has remained euglycemic.    4. Hypercalcemia: Calcium was 10.9 at presentation, and has dropped to 10.4 overnight, following hydration, although it is now creeping up again. Patient does have a known history of primary  hyperparathyroidism. Following levels.  5. Chronic pain: In setting of reflex sympathetic dystrophy and degenerative disc disease. Not problematic at this time.    Code Status: Full Code.  Family  Communication:  Disposition Plan: To be determined.    Brief narrative: 47 y.o. 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 Patient states that she has intermittent mild abdominal pain on regular basis. However, she developed acute onset of severe abdominal pain as well as nausea beginning early 12/20/12. Patient states that these issues initially started as a postoperative complication from cholecystectomy where she had a an accidental "nicking" of her bowel, that led to secondary sepsis. Patient states she had multiple episodes of small bowel obstruction subsequent to a septic episode. She eventually had obstruction repaired by Dr. Romie Levee, in 04/2012 and her symptoms have been much improved since this point. Patient denies any recent changes in her diet. No recent strenuous activity. Patient states she has been under some stress.  In the ER, lab work was generally within normal limits. However, a CT scan of the abdomen was obtained showing a small bowel obstruction at the area of prior anastomosis. Admitted for further management.    Consultants:  Surgery.   Procedures:  CXR/Abdominal X-Ray.   CT abdomen/pelvic CT scan.   Antibiotics:  N/A.  HPI/Subjective: H/A has resolved, and abdominal pain is less. No BM yet.   Objective: Vital signs in last 24 hours: Temp:  [97.7 F (36.5 C)-99 F (37.2 C)] 97.7 F (36.5 C) (05/13 1340) Pulse Rate:  [85-105] 97 (05/13 1340) Resp:  [18-20] 18 (05/13 1340) BP: (139-168)/(92-111) 139/92 mmHg (05/13 1340) SpO2:  [97 %-100 %] 97 % (05/13 1340) Weight:  [93.7 kg (206 lb 9.1  oz)] 93.7 kg (206 lb 9.1 oz) (05/13  0500) Weight change: -1.5 kg (-3 lb 4.9 oz) Last BM Date: 12/19/12  Intake/Output from previous day: 05/12 0701 - 05/13 0700 In: 0  Out: 750 [Emesis/NG output:750]     Physical Exam: General: Alert, communicative, fully oriented, not short of breath at rest. Hydration appears fair. HEENT:  No clinical pallor, no jaundice, no conjunctival injection or discharge. NECK:  Supple, JVP not seen, no carotid bruits, no palpable lymphadenopathy, no palpable goiter. CHEST:  Clinically clear to auscultation, no wheezes, no crackles. HEART:  Sounds 1 and 2 heard, normal, regular, no murmurs. ABDOMEN:  Full, soft, mildly tender in central abdomen, bowel sounds heard. Midline laparotomy scar noted. No palpable organomegaly, no palpable masses, normal bowel sounds. GENITALIA:  Not examined. LOWER EXTREMITIES:  No pitting edema, palpable peripheral pulses. MUSCULOSKELETAL SYSTEM:  Un remarkable. CENTRAL NERVOUS SYSTEM:  No focal neurologic deficit on gross examination.  Lab Results:  Recent Labs  12/22/12 0500 12/23/12 0508  WBC 17.2* 10.5  HGB 16.0* 15.9*  HCT 45.6 44.8  PLT 279 283    Recent Labs  12/22/12 0500 12/23/12 0508  NA 134* 138  K 3.9 3.8  CL 101 101  CO2 22 26  GLUCOSE 97 83  BUN 5* 11  CREATININE 0.52 0.66  CALCIUM 11.1* 11.9*   No results found for this or any previous visit (from the past 240 hour(s)).   Studies/Results: Dg Abd Portable 1v  12/22/2012  *RADIOLOGY REPORT*  Clinical Data: 47 year old female with small bowel obstruction.  PORTABLE ABDOMEN - 1 VIEW  Comparison: 12/20/2012 and earlier.  Findings: Portable AP supine view 0557 hours. Oral contrast has reached the colon is seen throughout the colon to the level of the distal descending segment.  No dilated bowel loops are evident.  Enteric tube in place, tip at the level of the proximal duodenum.  Stable right upper quadrant surgical clips. Stable right lower abdomen or pelvis generator device with lead  extending up into the thoracic spine.  IMPRESSION: Oral contrast has reached the colon.  The bowel gas pattern is nonobstructed.  Enteric tube in place, tip at the level of the second portion of the duodenum.   Original Report Authenticated By: Erskine Speed, M.D.     Medications: Scheduled Meds: . [START ON 12/28/2012] cloNIDine  0.2 mg Transdermal Weekly  . docusate sodium  200 mg Oral BID  . enalaprilat  1.25 mg Intravenous Q6H  . fentaNYL  50 mcg Transdermal Q72H  . heparin  5,000 Units Subcutaneous Q8H  . metoprolol  10 mg Intravenous Q6H  . pantoprazole (PROTONIX) IV  40 mg Intravenous QHS   Continuous Infusions: . 0.9 % NaCl with KCl 40 mEq / L 100 mL/hr at 12/21/12 1304   PRN Meds:.acetaminophen, bisacodyl, hydrALAZINE, morphine injection, ondansetron (ZOFRAN) IV, ondansetron, sodium chloride    LOS: 3 days   Brittne Kawasaki,CHRISTOPHER  [facility] Pager [phone]. If 8PM-8AM, please contact night-coverage at [email or web address], password [facility] 12/23/2012, 2:26 PM  LOS: 3 days

## 2012-12-23 NOTE — Progress Notes (Signed)
Refuses heparin IV fluids and CBG checks

## 2012-12-23 NOTE — Progress Notes (Signed)
Pt's. NGT fell off while ambulating in the hallway.

## 2012-12-24 LAB — BASIC METABOLIC PANEL
GFR calc Af Amer: >90 mL/min (ref >90)
GFR calc non Af Amer: >90 mL/min (ref >90)
Potassium: 3.7 mEq/L (ref 3.5–5.1)
Sodium: 137 mEq/L (ref 135–145)

## 2012-12-24 LAB — CBC
MCHC: 35.8 g/dL (ref 30.0–36.0)
RDW: 13.9 % (ref 11.5–15.5)

## 2012-12-24 MED ORDER — BISACODYL 5 MG PO TBEC
10.0000 mg | DELAYED_RELEASE_TABLET | Freq: Once | ORAL | Status: AC
  Administered 2012-12-24: 10 mg via ORAL
  Filled 2012-12-24: qty 2

## 2012-12-24 MED ORDER — METOPROLOL TARTRATE 50 MG PO TABS
50.0000 mg | ORAL_TABLET | Freq: Two times a day (BID) | ORAL | Status: DC
  Administered 2012-12-24: 50 mg via ORAL
  Filled 2012-12-24: qty 1

## 2012-12-24 MED ORDER — DSS 100 MG PO CAPS: 200.0000 mg | ORAL_CAPSULE | Freq: Two times a day (BID) | ORAL | Status: DC

## 2012-12-24 MED ORDER — METOPROLOL TARTRATE 50 MG PO TABS: 50.0000 mg | ORAL_TABLET | Freq: Two times a day (BID) | ORAL | Status: DC

## 2012-12-24 MED ORDER — LISINOPRIL 20 MG PO TABS
20.0000 mg | ORAL_TABLET | Freq: Every day | ORAL | Status: DC
  Administered 2012-12-24: 20 mg via ORAL
  Filled 2012-12-24: qty 1

## 2012-12-24 NOTE — Progress Notes (Signed)
Discharged patient to home with instructions. 

## 2012-12-24 NOTE — Progress Notes (Signed)
Here with small bowel anastomotic stricture.      Subjective:Ng fell out last night.  Pt feels better.  Her pain is minimal.    Objective: Vital signs in last 24 hours: Temp:  [97.1 F (36.2 C)-97.9 F (36.6 C)] 97.9 F (36.6 C) (05/14 0520) Pulse Rate:  [82-97] 82 (05/14 0520) Resp:  [18] 18 (05/14 0520) BP: (139-177)/(92-116) 158/116 mmHg (05/14 0520) SpO2:  [94 %-100 %] 94 % (05/14 0520) Weight:  [212 lb 4.9 oz (96.3 kg)] 212 lb 4.9 oz (96.3 kg) (05/14 0520) Last BM Date: 12/19/12  Intake/Output from previous day:   Intake/Output this shift:    General appearance: alert and cooperative GI: normal findings: soft, non-tender  Lab Results:  Results for orders placed during the hospital encounter of 12/20/12 (from the past 24 hour(s))  GLUCOSE, CAPILLARY     Status: None   Collection Time    12/23/12  8:17 AM      Result Value Range   Glucose-Capillary 93  70 - 99 mg/dL   Comment 1 Notify RN    CBC     Status: Abnormal   Collection Time    12/24/12  5:05 AM      Result Value Range   WBC 11.8 (*) 4.0 - 10.5 K/uL   RBC 5.45 (*) 3.87 - 5.11 MIL/uL   Hemoglobin 15.6 (*) 12.0 - 15.0 g/dL   HCT 45.4  09.8 - 11.9 %   MCV 80.0  78.0 - 100.0 fL   MCH 28.6  26.0 - 34.0 pg   MCHC 35.8  30.0 - 36.0 g/dL   RDW 14.7  82.9 - 56.2 %   Platelets 282  150 - 400 K/uL  BASIC METABOLIC PANEL     Status: Abnormal   Collection Time    12/24/12  5:05 AM      Result Value Range   Sodium 137  135 - 145 mEq/L   Potassium 3.7  3.5 - 5.1 mEq/L   Chloride 102  96 - 112 mEq/L   CO2 28  19 - 32 mEq/L   Glucose, Bld 100 (*) 70 - 99 mg/dL   BUN 12  6 - 23 mg/dL   Creatinine, Ser 1.30  0.50 - 1.10 mg/dL   Calcium 86.5 (*) 8.4 - 10.5 mg/dL   GFR calc non Af Amer >90  >90 mL/min   GFR calc Af Amer >90  >90 mL/min     Studies/Results Radiology CT shows small bowel stricture.  AXR shows contrast flowing through to the colon    MEDS, Scheduled . [START ON 12/28/2012] cloNIDine  0.2 mg  Transdermal Weekly  . docusate sodium  200 mg Oral BID  . enalaprilat  1.25 mg Intravenous Q6H  . fentaNYL  50 mcg Transdermal Q72H  . heparin  5,000 Units Subcutaneous Q8H  . metoprolol  10 mg Intravenous Q6H  . pantoprazole (PROTONIX) IV  40 mg Intravenous QHS     Assessment: <principal problem not specified> Small bowel stricture  Plan: Doing ok with fulls.  I have given her a laxative to help with bm's.  I would be ok with her going home today on full liquid diet.  She should f/u with me in the office to discuss further plans.   LOS: 4 days    Vanita Panda, MD [facility] Surgery, [place] [phone]   12/24/2012 8:08 AM

## 2012-12-24 NOTE — Discharge Summary (Signed)
Physician Discharge Summary  Emma Munoz ZOX:[id number] DOB: [birth year] DOA: 12/20/2012  PCP: Wynona Dove, MD  Admit date: 12/20/2012 Discharge date: 12/24/2012  Recommendations for Outpatient Follow-up:  1. Pt will need to follow up with PCP in 2 weeks post discharge 2. Please obtain BMP to evaluate electrolytes and kidney function 3. Please also check CBC to evaluate Hg and Hct levels 4. Follow up with Dr. Romie Levee in 1-2 weeks  Discharge Diagnoses:  Active Problems:   * No active hospital problems. * 1. 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 Abdominal/pelvic CT demonstrated mid small bowel obstruction located at a small bowel anastomosis in the mid abdomen consistent with an anastomotic stricture. Managed with bowel rest, NG-T, ivi fluids. Dr Gaynelle Adu provided surgical consultation, and conservative management is recommended. Dr Romie Levee saw patient on 12/23/12, and has discussed options in detail. She offered her a stricturoplasty vs a liquid diet, and patient would like to try the liquid diet for now. Observing. NG-T dislodged and fell out on 12/22/12, evening. -Patient was advanced to a full liquid diet  -She tolerated it without difficulty  -Patient was transitioned to oral medications which she took without problems  -Dr. Romie Levee cleared pt for d/c with full liquid diet and outpt followup 2. HTN. BP was initially very high, at 201/112-172/106, and is being managed with Clonidine patch, iv Lopressor, iv Enalaprilat and prn Hydralazine, with significant, albeit sub-optimal control.  -Patient was restarted on metoprolol tartrate with dose adjustment to 50 mg twice a day -She was also restarted on lisinopril 20 mg daily -The patient's clonidine patch fell  off the day before discharge which accounted for the patient's increasing blood pressure in addition to her pain 3. DM-2. Per patient this is diet-controlled, and has been under control, since she lost a significant amount of weight. Not on diabetic medication at this time. HBA1C is 5.5. Patient has declined SSI at this time and has remained euglycemic.  4. Hypercalcemia: Calcium was 10.9 at presentation, and has dropped to 10.4 overnight, following hydration, although it is now creeping up again. Patient does have a known history of primary hyperparathyroidism. Following levels.  5. Chronic pain: In setting of reflex sympathetic dystrophy and degenerative disc disease. Not problematic at this time.    Discharge Condition: stable  Disposition: home  Diet:FULL liquid Wt Readings from Last 3 Encounters:  12/24/12 96.3 kg (212 lb 4.9 oz)  12/03/12 95.029 kg (209 lb 8 oz)  10/10/12 93.441 kg (206 lb)    History of present illness:  47 y.o. 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 Patient states that she has intermittent mild abdominal pain on regular basis. However, she developed acute onset of severe abdominal pain as well as nausea beginning early 12/20/12. Patient states that these issues initially started as a postoperative complication from cholecystectomy where she had a an accidental "nicking" of her bowel, that led to secondary sepsis. Patient states she had multiple episodes of small bowel obstruction subsequent to a septic episode. She eventually had obstruction repaired by Dr. Romie Levee, in 04/2012 and her symptoms have been much  improved since this point. Patient  denies any recent changes in her diet. No recent strenuous activity. Patient states she has been under some stress.  In the ER, lab work was generally within normal limits. However, a CT scan of the abdomen was obtained showing a small bowel obstruction at the area of prior anastomosis. Admitted for further management.      Consultants: General Surgery-[facility]  Discharge Exam: Filed Vitals:   12/24/12 0520  BP: 158/116  Pulse: 82  Temp: 97.9 F (36.6 C)  Resp: 18   Filed Vitals:   12/23/12 0615 12/23/12 1340 12/23/12 2211 12/24/12 0520  BP: 152/106 139/92 177/110 158/116  Pulse: 85 97 88 82  Temp: 98.3 F (36.8 C) 97.7 F (36.5 C) 97.1 F (36.2 C) 97.9 F (36.6 C)  TempSrc: Oral Oral Oral Oral  Resp: 20 18 18 18   Height:      Weight:    96.3 kg (212 lb 4.9 oz)  SpO2: 100% 97% 100% 94%   General: A&O x 3, NAD, pleasant, cooperative Cardiovascular: RRR, no rub, no gallop, no S3 Respiratory: CTAB, no wheeze, no rhonchi Abdomen:soft, mild epigastric tenderness without any peritoneal signs or guarding, nondistended, positive bowel sounds Extremities: No edema, No lymphangitis, no petechiae  Discharge Instructions  Discharge Orders   Future Orders Complete By Expires     Diet - low sodium heart healthy  As directed     Discharge instructions  As directed     Scheduling Instructions:      Do NOT take Linzess until you after follow up with Dr. Romie Levee and your primary care physician.  Ask them about continuing this medication. Stop metoprolol 100mg  daily Start metoprolol 50 mg twice a day    Comments:      Do    Increase activity slowly  As directed         Medication List    STOP taking these medications       Linaclotide 290 MCG Caps  Commonly known as:  LINZESS      TAKE these medications       bifidobacterium infantis capsule  Take 1 capsule by mouth daily.     cyclobenzaprine 10 MG tablet  Commonly known as:  FLEXERIL  Take 20 mg by mouth at  bedtime.     dicyclomine 10 MG capsule  Commonly known as:  BENTYL  Take 1 capsule (10 mg total) by mouth 3 (three) times daily before meals.     DSS 100 MG Caps  Take 200 mg by mouth 2 (two) times daily.     esomeprazole 40 MG packet  Commonly known as:  NEXIUM  Take 40 mg by mouth daily before breakfast.     lisinopril 20 MG tablet  Commonly known as:  PRINIVIL,ZESTRIL  Take 1 tablet (20 mg total) by mouth daily.     metoprolol 50 MG tablet  Commonly known as:  LOPRESSOR  Take 1 tablet (50 mg total) by mouth 2 (two) times daily.     morphine 30 MG tablet  Commonly known as:  MSIR  Take 3 tablets (90 mg total) by mouth every 4 (four) hours as needed. For breakthrough pain     morphine 60 MG 24 hr capsule  Commonly known as:  KADIAN  Take 60 mg by mouth 2 (two) times daily.     polyethylene glycol powder powder  Commonly known as:  GLYCOLAX/MIRALAX  Take 17 g by mouth daily.  The results of significant diagnostics from this hospitalization (including imaging, microbiology, ancillary and laboratory) are listed below for reference.    Significant Diagnostic Studies: Ct Abdomen Pelvis W Contrast  12/20/2012   *RADIOLOGY REPORT*  Clinical Data: Right lower quadrant tenderness, periumbilical and supraumbilical abdominal pain, nausea, vomiting, constipation, abdominal distention, past history small bowel obstruction with bowel surgery, hysterectomy, cholecystectomy, incisional hernia repair, hypertension, smoking, diabetes  CT ABDOMEN AND PELVIS WITH CONTRAST  Technique:  Multidetector CT imaging of the abdomen and pelvis was performed following the standard protocol during bolus administration of intravenous contrast. Sagittal and coronal MPR images reconstructed from axial data set.  Contrast: OMNIPAQUE IOHEXOL 300 MG/ML  SOLN Dilute oral contrast.  Comparison: 07/16/2012  Findings: Mild dependent atelectasis at bilateral lung bases. 4 mm diameter nonobstructing  calculus lower pole right kidney image 33. Liver, spleen, pancreas, kidneys, and adrenal glands otherwise normal appearance. Normal appendix. Small amount of nonspecific low attenuation free pelvic fluid.  Dilated small bowel loops in mid abdomen with a small bowel stool sign. These extend to a small bowel anastomotic staple line in mid abdomen image 39 with decompressed small bowel beyond this site consistent with an anastomotic stricture with small bowel obstruction. Minimal hazy edema of the associated mesentery. Stomach and colon decompressed.  Uterus surgically absent with normal sized right ovary. Mildly enlarged but stable left ovary containing a small cyst. Unremarkable bladder and ureters. No mass, adenopathy, or free air. Small ventral hernia containing fat at mid abdomen image 43. No acute osseous findings  IMPRESSION: Mid small bowel obstruction located at a small bowel anastomosis in the mid abdomen consistent with an anastomotic stricture. 4 mm nonobstructing calculus lower pole right kidney. Small amount of nonspecific free intraperitoneal fluid.   Original Report Authenticated By: Ulyses Southward, M.D.   Dg Abd Acute W/chest  12/20/2012   *RADIOLOGY REPORT*  Clinical Data: Chest and abdominal pain, constipation, hypertension, history small bowel obstruction and smoking  ACUTE ABDOMEN SERIES (ABDOMEN 2 VIEW & CHEST 1 VIEW)  Comparison: 10/10/2012 abdominal radiographs, chest radiograph 04/24/2012  Findings: Intraspinal stimulator in cervical region with pulse generator in right mid abdomen. Normal heart size, mediastinal contours, and pulmonary vascularity. Chronic peribronchial thickening. No acute infiltrate, pleural effusion or pneumothorax. Increased stool throughout colon. No evidence of bowel obstruction, bowel wall thickening, or free intraperitoneal air. Bones appear demineralized. Surgical clips right quadrant question cholecystectomy.  IMPRESSION: Chronic bronchitic changes. Increased stool in  colon.   Original Report Authenticated By: Ulyses Southward, M.D.   Dg Abd Portable 1v  12/22/2012   *RADIOLOGY REPORT*  Clinical Data: 47 year old female with small bowel obstruction.  PORTABLE ABDOMEN - 1 VIEW  Comparison: 12/20/2012 and earlier.  Findings: Portable AP supine view 0557 hours. Oral contrast has reached the colon is seen throughout the colon to the level of the distal descending segment.  No dilated bowel loops are evident.  Enteric tube in place, tip at the level of the proximal duodenum.  Stable right upper quadrant surgical clips. Stable right lower abdomen or pelvis generator device with lead extending up into the thoracic spine.  IMPRESSION: Oral contrast has reached the colon.  The bowel gas pattern is nonobstructed.  Enteric tube in place, tip at the level of the second portion of the duodenum.   Original Report Authenticated By: Erskine Speed, M.D.     Microbiology: No results found for this or any previous visit (from the past 240 hour(s)).   Labs: Basic Metabolic Panel:  Recent Labs Lab 12/20/12 1637 12/21/12 0630 12/22/12 0500 12/23/12 0508 12/24/12 0505  NA 136 135 134* 138 137  K 3.8 3.9 3.9 3.8 3.7  CL 100 101 101 101 102  CO2 25 26 22 26 28   GLUCOSE 112* 114* 97 83 100*  BUN 7 9 5* 11 12  CREATININE 0.59 0.65 0.52 0.66 0.76  CALCIUM 10.9* 10.4 11.1* 11.9* 10.8*   Liver Function Tests:  Recent Labs Lab 12/20/12 1637 12/21/12 0630 12/22/12 0500 12/23/12 0508  AST 20 27 21 20   ALT 19 24 20 19   ALKPHOS 108 101 104 94  BILITOT 0.6 0.6 0.8 0.9  PROT 8.1 7.5 7.8 7.7  ALBUMIN 4.0 3.6 4.0 3.8    Recent Labs Lab 12/20/12 1637  LIPASE 11   No results found for this basename: AMMONIA,  in the last 168 hours CBC:  Recent Labs Lab 12/20/12 1637 12/21/12 0630 12/22/12 0500 12/23/12 0508 12/24/12 0505  WBC 9.0 13.0* 17.2* 10.5 11.8*  NEUTROABS 5.7  --   --   --   --   HGB 17.1* 16.2* 16.0* 15.9* 15.6*  HCT 46.8* 46.1* 45.6 44.8 43.6  MCV 78.8  79.9 80.0 80.1 80.0  PLT 320 287 279 283 282   Cardiac Enzymes: No results found for this basename: CKTOTAL, CKMB, CKMBINDEX, TROPONINI,  in the last 168 hours BNP: No components found with this basename: POCBNP,  CBG:  Recent Labs Lab 12/22/12 0808 12/22/12 1209 12/22/12 1557 12/22/12 1942 12/23/12 0817  GLUCAP 99 96 90 98 93    Time coordinating discharge:  Greater than 30 minutes  Signed:  Ehan Freas, DO [facility] Pager: [phone] 12/24/2012, 9:26 AM

## 2012-12-25 ENCOUNTER — Telehealth (INDEPENDENT_AMBULATORY_CARE_PROVIDER_SITE_OTHER): Payer: Self-pay | Admitting: *Deleted

## 2012-12-25 NOTE — Telephone Encounter (Signed)
Patient called for a 2 week PO appt but unable to find one at this time.

## 2013-01-06 ENCOUNTER — Telehealth (INDEPENDENT_AMBULATORY_CARE_PROVIDER_SITE_OTHER): Payer: Self-pay | Admitting: General Surgery

## 2013-01-06 ENCOUNTER — Emergency Department (HOSPITAL_COMMUNITY): Payer: Medicare Other

## 2013-01-06 ENCOUNTER — Encounter (HOSPITAL_COMMUNITY): Payer: Self-pay

## 2013-01-06 ENCOUNTER — Inpatient Hospital Stay (HOSPITAL_COMMUNITY)
Admission: EM | Admit: 2013-01-06 | Discharge: 2013-01-13 | DRG: 331 | Disposition: A | Discharge status: Home / Self Care | Payer: Medicare Other | Attending: General Surgery | Admitting: General Surgery

## 2013-01-06 DIAGNOSIS — K589 Irritable bowel syndrome without diarrhea: Secondary | ICD-10-CM | POA: Diagnosis present

## 2013-01-06 DIAGNOSIS — F1920 Other psychoactive substance dependence, uncomplicated: Secondary | ICD-10-CM | POA: Diagnosis present

## 2013-01-06 DIAGNOSIS — F3289 Other specified depressive episodes: Secondary | ICD-10-CM | POA: Diagnosis present

## 2013-01-06 DIAGNOSIS — K209 Esophagitis, unspecified without bleeding: Secondary | ICD-10-CM | POA: Diagnosis present

## 2013-01-06 DIAGNOSIS — K56609 Unspecified intestinal obstruction, unspecified as to partial versus complete obstruction: Secondary | ICD-10-CM

## 2013-01-06 DIAGNOSIS — E8881 Metabolic syndrome: Secondary | ICD-10-CM | POA: Diagnosis present

## 2013-01-06 DIAGNOSIS — Z6833 Body mass index (BMI) 33.0-33.9, adult: Secondary | ICD-10-CM

## 2013-01-06 DIAGNOSIS — E210 Primary hyperparathyroidism: Secondary | ICD-10-CM | POA: Diagnosis present

## 2013-01-06 DIAGNOSIS — K5900 Constipation, unspecified: Secondary | ICD-10-CM

## 2013-01-06 DIAGNOSIS — I10 Essential (primary) hypertension: Secondary | ICD-10-CM | POA: Diagnosis present

## 2013-01-06 DIAGNOSIS — K565 Intestinal adhesions [bands], unspecified as to partial versus complete obstruction: Principal | ICD-10-CM | POA: Diagnosis present

## 2013-01-06 DIAGNOSIS — F17200 Nicotine dependence, unspecified, uncomplicated: Secondary | ICD-10-CM | POA: Diagnosis present

## 2013-01-06 DIAGNOSIS — E669 Obesity, unspecified: Secondary | ICD-10-CM | POA: Diagnosis present

## 2013-01-06 DIAGNOSIS — E209 Hypoparathyroidism, unspecified: Secondary | ICD-10-CM | POA: Diagnosis present

## 2013-01-06 DIAGNOSIS — E785 Hyperlipidemia, unspecified: Secondary | ICD-10-CM | POA: Diagnosis present

## 2013-01-06 DIAGNOSIS — K219 Gastro-esophageal reflux disease without esophagitis: Secondary | ICD-10-CM | POA: Diagnosis present

## 2013-01-06 DIAGNOSIS — F329 Major depressive disorder, single episode, unspecified: Secondary | ICD-10-CM | POA: Diagnosis present

## 2013-01-06 LAB — CBC WITH DIFFERENTIAL/PLATELET
Basophils Relative: 1 % (ref 0–1)
HCT: 45.5 % (ref 36.0–46.0)
Hemoglobin: 16.2 g/dL — ABNORMAL HIGH (ref 12.0–15.0)
Lymphs Abs: 2.4 10*3/uL (ref 0.7–4.0)
MCHC: 35.6 g/dL (ref 30.0–36.0)
Monocytes Absolute: 0.5 10*3/uL (ref 0.1–1.0)
Monocytes Relative: 9 % (ref 3–12)
Neutro Abs: 3.1 10*3/uL (ref 1.7–7.7)
RBC: 5.67 MIL/uL — ABNORMAL HIGH (ref 3.87–5.11)

## 2013-01-06 LAB — URINALYSIS, ROUTINE W REFLEX MICROSCOPIC
Ketones, ur: 15 mg/dL — AB
Protein, ur: 100 mg/dL — AB
Urobilinogen, UA: 1 mg/dL (ref 0.0–1.0)

## 2013-01-06 LAB — URINE MICROSCOPIC-ADD ON

## 2013-01-06 LAB — COMPREHENSIVE METABOLIC PANEL
Alkaline Phosphatase: 165 U/L — ABNORMAL HIGH (ref 39–117)
BUN: 7 mg/dL (ref 6–23)
CO2: 22 mEq/L (ref 19–32)
Chloride: 101 mEq/L (ref 96–112)
Creatinine, Ser: 0.58 mg/dL (ref 0.50–1.10)
GFR calc Af Amer: >90 mL/min (ref >90)
GFR calc non Af Amer: >90 mL/min (ref >90)
Glucose, Bld: 102 mg/dL — ABNORMAL HIGH (ref 70–99)
Potassium: 4 mEq/L (ref 3.5–5.1)
Total Bilirubin: 0.3 mg/dL (ref 0.3–1.2)

## 2013-01-06 LAB — LIPASE, BLOOD: Lipase: 10 U/L — ABNORMAL LOW (ref 11–59)

## 2013-01-06 MED ORDER — MORPHINE SULFATE 4 MG/ML IJ SOLN
4.0000 mg | Freq: Once | INTRAMUSCULAR | Status: AC
  Administered 2013-01-06: 4 mg via INTRAVENOUS
  Filled 2013-01-06: qty 1

## 2013-01-06 MED ORDER — METOPROLOL TARTRATE 50 MG PO TABS
50.0000 mg | ORAL_TABLET | Freq: Two times a day (BID) | ORAL | Status: DC
  Administered 2013-01-06 – 2013-01-07 (×2): 50 mg via ORAL
  Filled 2013-01-06 (×3): qty 1

## 2013-01-06 MED ORDER — ONDANSETRON HCL 4 MG/2ML IJ SOLN
4.0000 mg | Freq: Once | INTRAMUSCULAR | Status: AC
  Administered 2013-01-06: 4 mg via INTRAVENOUS
  Filled 2013-01-06: qty 2

## 2013-01-06 MED ORDER — IOHEXOL 300 MG/ML  SOLN
50.0000 mL | Freq: Once | INTRAMUSCULAR | Status: AC | PRN
  Administered 2013-01-06: 50 mL via ORAL

## 2013-01-06 MED ORDER — ONDANSETRON HCL 4 MG/2ML IJ SOLN
4.0000 mg | Freq: Four times a day (QID) | INTRAMUSCULAR | Status: DC | PRN
  Administered 2013-01-06: 4 mg via INTRAVENOUS
  Filled 2013-01-06: qty 2

## 2013-01-06 MED ORDER — MORPHINE SULFATE 2 MG/ML IJ SOLN
2.0000 mg | INTRAMUSCULAR | Status: DC | PRN
  Administered 2013-01-06 (×2): 4 mg via INTRAVENOUS
  Administered 2013-01-07: 2 mg via INTRAVENOUS
  Administered 2013-01-07: 4 mg via INTRAVENOUS
  Filled 2013-01-06 (×3): qty 2
  Filled 2013-01-06: qty 1

## 2013-01-06 MED ORDER — ENOXAPARIN SODIUM 40 MG/0.4ML ~~LOC~~ SOLN
40.0000 mg | SUBCUTANEOUS | Status: DC
  Administered 2013-01-07 – 2013-01-12 (×6): 40 mg via SUBCUTANEOUS
  Filled 2013-01-06 (×11): qty 0.4

## 2013-01-06 MED ORDER — PANTOPRAZOLE SODIUM 40 MG IV SOLR
40.0000 mg | Freq: Every day | INTRAVENOUS | Status: DC
  Administered 2013-01-06 – 2013-01-10 (×5): 40 mg via INTRAVENOUS
  Filled 2013-01-06 (×8): qty 40

## 2013-01-06 MED ORDER — SODIUM CHLORIDE 0.9 % IV BOLUS (SEPSIS)
1000.0000 mL | Freq: Once | INTRAVENOUS | Status: AC
  Administered 2013-01-06: 1000 mL via INTRAVENOUS

## 2013-01-06 MED ORDER — POTASSIUM CHLORIDE IN NACL 20-0.9 MEQ/L-% IV SOLN
INTRAVENOUS | Status: DC
  Administered 2013-01-06 – 2013-01-07 (×2): via INTRAVENOUS
  Filled 2013-01-06 (×5): qty 1000

## 2013-01-06 MED ORDER — IOHEXOL 300 MG/ML  SOLN
100.0000 mL | Freq: Once | INTRAMUSCULAR | Status: AC | PRN
  Administered 2013-01-06: 100 mL via INTRAVENOUS

## 2013-01-06 MED ORDER — ACETAMINOPHEN 650 MG RE SUPP
650.0000 mg | RECTAL | Status: DC | PRN
  Filled 2013-01-06: qty 1

## 2013-01-06 MED ORDER — LISINOPRIL 20 MG PO TABS
20.0000 mg | ORAL_TABLET | Freq: Every day | ORAL | Status: DC
  Administered 2013-01-06 – 2013-01-12 (×7): 20 mg via ORAL
  Filled 2013-01-06 (×9): qty 1

## 2013-01-06 NOTE — Telephone Encounter (Signed)
Patient calling stating Dr Maisie Fus is planning surgery in a few weeks and she was to call if she got worse prior to that. Patient states starting yesterday she starting having severe nausea. Her abdomen is distended she is having abdominal cramping and back pain. She states she has not had a bowel movement in one week and she has not eaten since yesterday. Please advise. [phone].

## 2013-01-06 NOTE — ED Provider Notes (Signed)
History     CSN: [id number]  Arrival date & time 01/06/13  1342   First MD Initiated Contact with Patient 01/06/13 1516      Chief Complaint  Patient presents with  . Abdominal Pain    (Consider location/radiation/quality/duration/timing/severity/associated sxs/prior treatment) Patient is a 47 y.o. female presenting with abdominal pain.  Abdominal Pain Associated symptoms include abdominal pain.   Pt with numerous medical problems including prior SBO due to adhesions that originated from complicated cholecystectomy. She had lysis of adhesions several months ago but had another episode of SBO about 3 weeks ago felt to be related to anastomotic stricture, treated conservatively with pain medications and clear liquids. She reports some initial improvement in pain after discharge, but over the last 3 days has had increasing pain, abdominal bloating and nausea. No vomiting, no fever. Has not passed any stool or gas in about a week. Unable to tolerate more than sips of liquids.   Past Medical History  Diagnosis Date  . Hypertension   . IBS (irritable bowel syndrome)   . Heart murmur   . Hyperglycemia   . Tobacco abuse   . Type II diabetes mellitus     for 6 weeks only 09/2011-11/2011  . GERD (gastroesophageal reflux disease)   . Gastroparesis   . Primary hyperparathyroidism   . Migraines   . Hyperlipidemia   . Metabolic syndrome X   . Obesity   . Anemia   . Depression   . Herniated cervical disc   . Insomnia   . Stenosis colon     s/p bowel resection for small bowel stenosis  . Reflex sympathetic dystrophy     Past Surgical History  Procedure Laterality Date  . Cesarean section  85, 87, 95  . Lumbar disc surgery  1993; 2000  . Ulnar nerve repair  1998    left "got my hand crushed"  . Myomectomy  1982-2005    "multiple; mostly w/scopes" (05/13/2012)  . Bowel resection  04/25/2012    Procedure: SMALL BOWEL RESECTION;  Surgeon: Romie Levee, MD;  Location: [facility] OR;  Service:  General;  Laterality: N/A;  small bowell resection, incisional hernia repair  . Incisional hernia repair  04/25/2012    Procedure: HERNIA REPAIR INCISIONAL;  Surgeon: Romie Levee, MD;  Location: [facility] OR;  Service: General;  Laterality: N/A;  . Colon surgery  05/13/2012    small bowel resection w/IHR  . Cholecystectomy  11/2011    Dr. Lemar Livings [facility]) - bowel perforation and repair  . Hystrectomy  2005  . Tubal ligation  1995  . Collateral ligament repair, knee  1987; 1990's    left  . Implantation vagal nerve stimulator  07/2011    "lower back" (05/13/2012)  . Shoulder arthroscopy w/ rotator cuff repair  ~ 2005    left  . Hernia repair  04/2012; 05/13/2012    incisional  . Back surgery      Family History  Problem Relation Age of Onset  . Early death Father 71    GUNSHOT  . Kidney disease Brother     s/p transplant  . Heart disease Maternal Grandmother     Heart defect  . Prostate cancer Maternal Grandfather   . Cancer Maternal Grandfather     prostate    History  Substance Use Topics  . Smoking status: Current Every Day Smoker -- 1.00 packs/day for 24 years    Types: Cigarettes  . Smokeless tobacco: Never Used     Comment:  12/22/2012 offered smoking cessation materials; pt declines  . Alcohol Use: No     Comment: Rarely    OB History   Grav Para Term Preterm Abortions TAB SAB Ect Mult Living                  Review of Systems  Gastrointestinal: Positive for abdominal pain.   All other systems reviewed and are negative except as noted in HPI.   Allergies  Aspirin; Dilaudid; Ibuprofen; Latex; Nsaids; and Gadolinium derivatives  Home Medications   Current Outpatient Rx  Name  Route  Sig  Dispense  Refill  . bifidobacterium infantis (ALIGN) capsule   Oral   Take 1 capsule by mouth daily.   14 capsule   0   . cyclobenzaprine (FLEXERIL) 10 MG tablet   Oral   Take 20 mg by mouth at bedtime.         . dicyclomine (BENTYL) 10 MG capsule   Oral    Take 1 capsule (10 mg total) by mouth 3 (three) times daily before meals.   50 capsule   0   . docusate sodium 100 MG CAPS   Oral   Take 200 mg by mouth 2 (two) times daily.   10 capsule   0   . esomeprazole (NEXIUM) 40 MG packet   Oral   Take 40 mg by mouth daily before breakfast.   30 each   12   . LINZESS 290 MCG CAPS   Oral   Take 290 mg by mouth daily.         Marland Kitchen lisinopril (PRINIVIL,ZESTRIL) 20 MG tablet   Oral   Take 1 tablet (20 mg total) by mouth daily.   30 tablet   0     Last refill, patient needs to call office to sched ...   . metoprolol (LOPRESSOR) 50 MG tablet   Oral   Take 1 tablet (50 mg total) by mouth 2 (two) times daily.   60 tablet   0   . morphine (KADIAN) 60 MG 24 hr capsule   Oral   Take 60 mg by mouth 2 (two) times daily.         Marland Kitchen morphine (MSIR) 30 MG tablet   Oral   Take 3 tablets (90 mg total) by mouth every 4 (four) hours as needed. For breakthrough pain   120 tablet   0   . polyethylene glycol powder (GLYCOLAX/MIRALAX) powder   Oral   Take 17 g by mouth daily.   255 g   3     BP 166/128  Pulse 98  Temp(Src) 98.3 F (36.8 C) (Oral)  Resp 16  SpO2 96%  Physical Exam  Nursing note and vitals reviewed. Constitutional: She is oriented to person, place, and time. She appears well-developed and well-nourished.  HENT:  Head: Normocephalic and atraumatic.  Eyes: EOM are normal. Pupils are equal, round, and reactive to light.  Neck: Normal range of motion. Neck supple.  Cardiovascular: Normal rate, normal heart sounds and intact distal pulses.   Pulmonary/Chest: Effort normal and breath sounds normal.  Abdominal: She exhibits no distension. There is tenderness (diffuse, most severe over the R side). There is guarding.  Decreased bowel sounds  Musculoskeletal: Normal range of motion. She exhibits no edema and no tenderness.  Neurological: She is alert and oriented to person, place, and time. She has normal strength. No  cranial nerve deficit or sensory deficit.  Skin: Skin is warm and dry. No  rash noted.  Psychiatric: She has a normal mood and affect.    ED Course  Procedures (including critical care time)  Labs Reviewed  CBC WITH DIFFERENTIAL - Abnormal; Notable for the following:    RBC 5.67 (*)    Hemoglobin 16.2 (*)    All other components within normal limits  COMPREHENSIVE METABOLIC PANEL - Abnormal; Notable for the following:    Glucose, Bld 102 (*)    AST 85 (*)    ALT 61 (*)    Alkaline Phosphatase 165 (*)    All other components within normal limits  LIPASE, BLOOD - Abnormal; Notable for the following:    Lipase 10 (*)    All other components within normal limits  URINALYSIS, ROUTINE W REFLEX MICROSCOPIC   Ct Abdomen Pelvis W Contrast  01/06/2013   *RADIOLOGY REPORT*  Clinical Data: Abdominal pain, nausea  CT ABDOMEN AND PELVIS WITH CONTRAST  Technique:  Multidetector CT imaging of the abdomen and pelvis was performed following the standard protocol during bolus administration of intravenous contrast.  Contrast: OMNIPAQUE IOHEXOL 300 MG/ML  SOLN  Comparison: 12/20/2012  Findings: Minimal basilar atelectasis.  No pericardial or pleural effusion.  No hiatal hernia.  Abdomen:  Prior cholecystectomy evident.  Liver, biliary system, pancreas, spleen, adrenal glands, and kidneys are stable and within normal limits for age.  There is a small fat containing midline ventral hernia as before, unchanged.  Within the lower abdomen extending into the pelvis, there are several dilated loops of fluid distended small bowel with a transition point at a small bowel staple line.  This is consistent with an anastomotic stricture with small bowel obstruction. Pattern is similar to 12/20/2012.  No evidence of free air, bowel wall thickening, or pneumatosis.  Distal small bowel and colon are decompressed.  Colon is stool-filled.  No abdominal free fluid, fluid collection, hemorrhage, adenopathy, or abscess.   Pelvis:  Bilateral ovarian cysts noted, measuring 5.0 x 3.8 cm on the right and 5.5 x 5.2 cm on the left, image 75.  No significant pelvic free fluid.  Urinary bladder is under distended.  No pelvic abscess, fluid collection, hemorrhage, inguinal abnormality, or hernia.  Spinal cord stimulator noted.  Degenerative changes of the lower lumbar spine and SI joints.  Prior hysterectomy evident.  IMPRESSION: Recurrent mid small bowel obstruction with a transition at the small bowel anastomosis staple line suspicious for an anastomotic stricture.  Negative for free air or abscess  Stable midline abdominal wall fat containing ventral hernia  Prior cholecystectomy and hysterectomy.  Bilateral ovarian cysts.   Original Report Authenticated By: Judie Petit. Shick, M.D.   Dg Abd Acute W/chest  01/06/2013   *RADIOLOGY REPORT*  Clinical Data: Abdominal pain, nausea, history of small bowel obstruction  ACUTE ABDOMEN SERIES (ABDOMEN 2 VIEW & CHEST 1 VIEW)  Comparison: CT abdomen/pelvis dated 12/20/2012  Findings: Lungs are essentially clear.  No focal consolidation. No pleural effusion or pneumothorax.  The heart is normal in size.  Nonobstructive bowel gas pattern.  No evidence of free air under the diaphragm on the upright view.  Moderate stool throughout the colon.  IMPRESSION: No evidence of acute cardiopulmonary disease.  No evidence of small bowel obstruction or free air.  Moderate colonic stool burden, suggesting constipation.   Original Report Authenticated By: Charline Bills, M.D.     1. SBO (small bowel obstruction)       MDM  Pt minimally improved with Meds, repeat dose ordered. Discussed clinical presentation, labs and imaging  with Dr. Derrell Lolling on call for General Surgery who recommends checking CT and will come evaluate the patient.   8:06 PM CT as above. Dr. Corliss Skains aware of patient and CT results and will admit. No active vomiting now. Defer NG-Tube.       Charles B. Bernette Mayers, MD 01/06/13 2007

## 2013-01-06 NOTE — Telephone Encounter (Signed)
She can try some enemas or suppositories to see if it is constipation that is causing her problems.  If she is unable to tolerate any liquids, then she will need to go back to the ED.

## 2013-01-06 NOTE — ED Notes (Signed)
Dr. Maisie Fus is pt. Surgeon.  Pt. Has a blockage in small intestine. Was admitted on 12/20/12 and did not due surgery due to inflammation.  Was discharged to wait for the inflammation to improve.   Pt.s abdominal pain has increased and was directed by Dr. Maurine Minister nurse to come to ED.  Pt. Denies any vomiting but feels nauseated.

## 2013-01-06 NOTE — Telephone Encounter (Signed)
I called and relayed the message from Dr Maisie Fus to the pt.  She states she has only take sips of water today.  She is near vomiting and can feel it in the back of her throat.  She doesn't think she can drink or eat anything other than that.  She feels as she did before when she got admitted.  Her abdomen is distended and she can see her intestines popping through the hernia.  She thinks the blockage is possibly bigger.  I told Dr Maisie Fus and she said go on to the ER.   Pt aware and she will.

## 2013-01-06 NOTE — H&P (Signed)
Emma Munoz is an 47 y.o. female.   Chief Complaint: abd pain and nausea HPI: 47 y/o F with h/o sb stricture. Pt was seen recently in the hospital with a similar issue.  She presents with 3d h/o increasing abd pain and not tolerating her liquid diet.  Pt has had no emesis but has been nauseated.  Obstipation x 3-4d per pt.  Past Medical History  Diagnosis Date  . Hypertension   . IBS (irritable bowel syndrome)   . Heart murmur   . Hyperglycemia   . Tobacco abuse   . Type II diabetes mellitus     for 6 weeks only 09/2011-11/2011  . GERD (gastroesophageal reflux disease)   . Gastroparesis   . Primary hyperparathyroidism   . Migraines   . Hyperlipidemia   . Metabolic syndrome X   . Obesity   . Anemia   . Depression   . Herniated cervical disc   . Insomnia   . Stenosis colon     s/p bowel resection for small bowel stenosis  . Reflex sympathetic dystrophy     Past Surgical History  Procedure Laterality Date  . Cesarean section  85, 87, 95  . Lumbar disc surgery  1993; 2000  . Ulnar nerve repair  1998    left "got my hand crushed"  . Myomectomy  1982-2005    "multiple; mostly w/scopes" (05/13/2012)  . Bowel resection  04/25/2012    Procedure: SMALL BOWEL RESECTION;  Surgeon: Romie Levee, MD;  Location: [facility] OR;  Service: General;  Laterality: N/A;  small bowell resection, incisional hernia repair  . Incisional hernia repair  04/25/2012    Procedure: HERNIA REPAIR INCISIONAL;  Surgeon: Romie Levee, MD;  Location: [facility] OR;  Service: General;  Laterality: N/A;  . Colon surgery  05/13/2012    small bowel resection w/IHR  . Cholecystectomy  11/2011    Dr. Lemar Livings [facility] regional) - bowel perforation and repair  . Hystrectomy  2005  . Tubal ligation  1995  . Collateral ligament repair, knee  1987; 1990's    left  . Implantation vagal nerve stimulator  07/2011    "lower back" (05/13/2012)  . Shoulder arthroscopy w/ rotator cuff repair  ~ 2005    left  . Hernia repair  04/2012;  05/13/2012    incisional  . Back surgery      Family History  Problem Relation Age of Onset  . Early death Father 48    GUNSHOT  . Kidney disease Brother     s/p transplant  . Heart disease Maternal Grandmother     Heart defect  . Prostate cancer Maternal Grandfather   . Cancer Maternal Grandfather     prostate   Social History:  reports that she has been smoking Cigarettes.  She has a 24 pack-year smoking history. She has never used smokeless tobacco. She reports that she does not drink alcohol or use illicit drugs.  Allergies:  Allergies  Allergen Reactions  . Aspirin Anaphylaxis    REACTION: Tongue, throat, extremities swell  . Dilaudid (Hydromorphone Hcl) Nausea And Vomiting  . Ibuprofen Anaphylaxis    REACTION: Tongue, throat, extremities swell  . Latex Hives  . Nsaids Anaphylaxis    REACTION: Tongue, throat, extremities swell  . Gadolinium Derivatives Nausea And Vomiting and Other (See Comments)    Pt states nausea and vomiting to GAD in MRI pt states she does ok with iv/cm in cat scan     (Not in a hospital admission)  Results for orders placed during the hospital encounter of 01/06/13 (from the past 48 hour(s))  CBC WITH DIFFERENTIAL     Status: Abnormal   Collection Time    01/06/13  3:43 PM      Result Value Range   WBC 6.2  4.0 - 10.5 K/uL   RBC 5.67 (*) 3.87 - 5.11 MIL/uL   Hemoglobin 16.2 (*) 12.0 - 15.0 g/dL   HCT 96.0  45.4 - 09.8 %   MCV 80.2  78.0 - 100.0 fL   MCH 28.6  26.0 - 34.0 pg   MCHC 35.6  30.0 - 36.0 g/dL   RDW 11.9  14.7 - 82.9 %   Platelets 282  150 - 400 K/uL   Neutrophils Relative % 51  43 - 77 %   Neutro Abs 3.1  1.7 - 7.7 K/uL   Lymphocytes Relative 38  12 - 46 %   Lymphs Abs 2.4  0.7 - 4.0 K/uL   Monocytes Relative 9  3 - 12 %   Monocytes Absolute 0.5  0.1 - 1.0 K/uL   Eosinophils Relative 2  0 - 5 %   Eosinophils Absolute 0.1  0.0 - 0.7 K/uL   Basophils Relative 1  0 - 1 %   Basophils Absolute 0.0  0.0 - 0.1 K/uL   COMPREHENSIVE METABOLIC PANEL     Status: Abnormal   Collection Time    01/06/13  3:43 PM      Result Value Range   Sodium 136  135 - 145 mEq/L   Potassium 4.0  3.5 - 5.1 mEq/L   Chloride 101  96 - 112 mEq/L   CO2 22  19 - 32 mEq/L   Glucose, Bld 102 (*) 70 - 99 mg/dL   BUN 7  6 - 23 mg/dL   Creatinine, Ser 5.62  0.50 - 1.10 mg/dL   Calcium 13.0  8.4 - 86.5 mg/dL   Total Protein 7.8  6.0 - 8.3 g/dL   Albumin 3.7  3.5 - 5.2 g/dL   AST 85 (*) 0 - 37 U/L   ALT 61 (*) 0 - 35 U/L   Alkaline Phosphatase 165 (*) 39 - 117 U/L   Total Bilirubin 0.3  0.3 - 1.2 mg/dL   GFR calc non Af Amer >90  >90 mL/min   GFR calc Af Amer >90  >90 mL/min   Comment:            The eGFR has been calculated     using the CKD EPI equation.     This calculation has not been     validated in all clinical     situations.     eGFR's persistently     <90 mL/min signify     possible Chronic Kidney Disease.  LIPASE, BLOOD     Status: Abnormal   Collection Time    01/06/13  3:43 PM      Result Value Range   Lipase 10 (*) 11 - 59 U/L  URINALYSIS, ROUTINE W REFLEX MICROSCOPIC     Status: Abnormal   Collection Time    01/06/13  4:20 PM      Result Value Range   Color, Urine AMBER (*) YELLOW   Comment: BIOCHEMICALS MAY BE AFFECTED BY COLOR   APPearance HAZY (*) CLEAR   Specific Gravity, Urine 1.038 (*) 1.005 - 1.030   pH 5.5  5.0 - 8.0   Glucose, UA NEGATIVE  NEGATIVE mg/dL   Hgb urine dipstick  NEGATIVE  NEGATIVE   Bilirubin Urine MODERATE (*) NEGATIVE   Ketones, ur 15 (*) NEGATIVE mg/dL   Protein, ur 161 (*) NEGATIVE mg/dL   Urobilinogen, UA 1.0  0.0 - 1.0 mg/dL   Nitrite NEGATIVE  NEGATIVE   Leukocytes, UA TRACE (*) NEGATIVE  URINE MICROSCOPIC-ADD ON     Status: Abnormal   Collection Time    01/06/13  4:20 PM      Result Value Range   Squamous Epithelial / LPF FEW (*) RARE   WBC, UA 3-6  <3 WBC/hpf   Bacteria, UA FEW (*) RARE   Casts HYALINE CASTS (*) NEGATIVE   Urine-Other MUCOUS PRESENT      Dg Abd Acute W/chest  01/06/2013   *RADIOLOGY REPORT*  Clinical Data: Abdominal pain, nausea, history of small bowel obstruction  ACUTE ABDOMEN SERIES (ABDOMEN 2 VIEW & CHEST 1 VIEW)  Comparison: CT abdomen/pelvis dated 12/20/2012  Findings: Lungs are essentially clear.  No focal consolidation. No pleural effusion or pneumothorax.  The heart is normal in size.  Nonobstructive bowel gas pattern.  No evidence of free air under the diaphragm on the upright view.  Moderate stool throughout the colon.  IMPRESSION: No evidence of acute cardiopulmonary disease.  No evidence of small bowel obstruction or free air.  Moderate colonic stool burden, suggesting constipation.   Original Report Authenticated By: Charline Bills, M.D.    Review of Systems  Constitutional: Negative.   HENT: Negative.   Eyes: Negative.   Respiratory: Negative.   Cardiovascular: Negative.   Gastrointestinal: Positive for nausea, abdominal pain and constipation. Negative for vomiting and diarrhea.  Musculoskeletal: Negative.   Skin: Negative.   Neurological: Negative.   All other systems reviewed and are negative.    Blood pressure 150/111, pulse 90, temperature 98.3 F (36.8 C), temperature source Oral, resp. rate 20, SpO2 98.00%. Physical Exam  Nursing note and vitals reviewed. Constitutional: She is oriented to person, place, and time. She appears well-developed and well-nourished.  HENT:  Head: Normocephalic and atraumatic.  Eyes: Conjunctivae and EOM are normal. Pupils are equal, round, and reactive to light.  Neck: Normal range of motion. Neck supple.  Cardiovascular: Normal rate, regular rhythm and normal heart sounds.   Respiratory: Effort normal and breath sounds normal.  GI: Soft. Bowel sounds are normal. She exhibits distension. She exhibits no mass. There is tenderness (x4Q). There is no rebound and no guarding.  Musculoskeletal: Normal range of motion.  Neurological: She is alert and oriented to person,  place, and time.  Skin: Skin is warm.     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  Obtain CT with PO contrast to eval for SBO If CT shows sign of SBO admit for Obs and NGT Will d/w Dr. Maisie Fus.   Marigene Ehlers., Aretha Levi 01/06/2013, 5:08 PM

## 2013-01-06 NOTE — ED Notes (Signed)
Spoke with [facility] 's office , They reported for pt. To be seen by our EDP and consult [facility] if needed

## 2013-01-06 NOTE — ED Notes (Signed)
Report attempted 

## 2013-01-07 ENCOUNTER — Inpatient Hospital Stay (HOSPITAL_COMMUNITY): Payer: Medicare Other | Admitting: Anesthesiology

## 2013-01-07 ENCOUNTER — Inpatient Hospital Stay (HOSPITAL_COMMUNITY): Payer: Medicare Other

## 2013-01-07 ENCOUNTER — Encounter (HOSPITAL_COMMUNITY)
Admission: EM | Disposition: A | Discharge status: Home / Self Care | Payer: Self-pay | Source: Home / Self Care | Attending: General Surgery

## 2013-01-07 ENCOUNTER — Encounter (HOSPITAL_COMMUNITY): Payer: Self-pay | Admitting: *Deleted

## 2013-01-07 ENCOUNTER — Encounter (HOSPITAL_COMMUNITY): Payer: Self-pay | Admitting: Anesthesiology

## 2013-01-07 DIAGNOSIS — K565 Intestinal adhesions [bands], unspecified as to partial versus complete obstruction: Secondary | ICD-10-CM

## 2013-01-07 HISTORY — PX: LAPAROTOMY: SHX154

## 2013-01-07 LAB — BASIC METABOLIC PANEL
BUN: 5 mg/dL — ABNORMAL LOW (ref 6–23)
CO2: 27 mEq/L (ref 19–32)
Calcium: 9.9 mg/dL (ref 8.4–10.5)
Creatinine, Ser: 0.59 mg/dL (ref 0.50–1.10)
Glucose, Bld: 122 mg/dL — ABNORMAL HIGH (ref 70–99)

## 2013-01-07 LAB — URINE CULTURE: Culture: NO GROWTH

## 2013-01-07 LAB — CBC
MCH: 28.4 pg (ref 26.0–34.0)
MCHC: 35.3 g/dL (ref 30.0–36.0)
MCV: 80.6 fL (ref 78.0–100.0)
Platelets: 297 10*3/uL (ref 150–400)
RBC: 5.45 MIL/uL — ABNORMAL HIGH (ref 3.87–5.11)
RDW: 13.8 % (ref 11.5–15.5)

## 2013-01-07 SURGERY — LAPAROTOMY, EXPLORATORY
Anesthesia: General | Site: Abdomen | Wound class: Clean Contaminated

## 2013-01-07 MED ORDER — MORPHINE SULFATE 4 MG/ML IJ SOLN
INTRAMUSCULAR | Status: AC
  Administered 2013-01-07: 4 mg
  Filled 2013-01-07: qty 1

## 2013-01-07 MED ORDER — LACTATED RINGERS IV SOLN
INTRAVENOUS | Status: DC | PRN
  Administered 2013-01-07 (×2): via INTRAVENOUS

## 2013-01-07 MED ORDER — SODIUM CHLORIDE 0.9 % IJ SOLN: 9.0000 mL | INTRAMUSCULAR | Status: DC | PRN

## 2013-01-07 MED ORDER — DIPHENHYDRAMINE HCL 12.5 MG/5ML PO ELIX: 12.5000 mg | ORAL_SOLUTION | Freq: Four times a day (QID) | ORAL | Status: DC | PRN

## 2013-01-07 MED ORDER — CHLORHEXIDINE GLUCONATE 4 % EX LIQD
1.0000 "application " | Freq: Once | CUTANEOUS | Status: AC
  Administered 2013-01-07: 1 via TOPICAL
  Filled 2013-01-07: qty 15

## 2013-01-07 MED ORDER — SUCCINYLCHOLINE CHLORIDE 20 MG/ML IJ SOLN
INTRAMUSCULAR | Status: DC | PRN
  Administered 2013-01-07: 110 mg via INTRAVENOUS

## 2013-01-07 MED ORDER — NALOXONE HCL 0.4 MG/ML IJ SOLN: 0.4000 mg | INTRAMUSCULAR | Status: DC | PRN

## 2013-01-07 MED ORDER — MORPHINE SULFATE (PF) 1 MG/ML IV SOLN: INTRAVENOUS | Status: DC

## 2013-01-07 MED ORDER — OXYCODONE HCL 5 MG/5ML PO SOLN: 5.0000 mg | Freq: Once | ORAL | Status: DC | PRN

## 2013-01-07 MED ORDER — LACTATED RINGERS IV SOLN
INTRAVENOUS | Status: DC
  Administered 2013-01-07: 13:00:00 via INTRAVENOUS

## 2013-01-07 MED ORDER — MIDAZOLAM HCL 5 MG/5ML IJ SOLN
INTRAMUSCULAR | Status: DC | PRN
  Administered 2013-01-07 (×2): 1 mg via INTRAVENOUS

## 2013-01-07 MED ORDER — NEOSTIGMINE METHYLSULFATE 1 MG/ML IJ SOLN
INTRAMUSCULAR | Status: DC | PRN
  Administered 2013-01-07: 4 mg via INTRAVENOUS

## 2013-01-07 MED ORDER — MORPHINE SULFATE (PF) 1 MG/ML IV SOLN
INTRAVENOUS | Status: DC
  Administered 2013-01-07: 13.01 mg via INTRAVENOUS
  Administered 2013-01-07: 23:00:00 via INTRAVENOUS
  Administered 2013-01-08: 22.78 mg via INTRAVENOUS
  Administered 2013-01-08: 19.35 mg via INTRAVENOUS
  Administered 2013-01-08: 04:00:00 via INTRAVENOUS
  Filled 2013-01-07 (×3): qty 25

## 2013-01-07 MED ORDER — METOPROLOL TARTRATE 1 MG/ML IV SOLN
10.0000 mg | Freq: Four times a day (QID) | INTRAVENOUS | Status: DC
  Administered 2013-01-07 – 2013-01-11 (×16): 10 mg via INTRAVENOUS
  Filled 2013-01-07 (×20): qty 10

## 2013-01-07 MED ORDER — ONDANSETRON HCL 4 MG/2ML IJ SOLN: 4.0000 mg | Freq: Four times a day (QID) | INTRAMUSCULAR | Status: DC | PRN

## 2013-01-07 MED ORDER — LIDOCAINE HCL (CARDIAC) 20 MG/ML IV SOLN
INTRAVENOUS | Status: DC | PRN
  Administered 2013-01-07: 20 mg via INTRAVENOUS

## 2013-01-07 MED ORDER — ARTIFICIAL TEARS OP OINT
TOPICAL_OINTMENT | OPHTHALMIC | Status: DC | PRN
  Administered 2013-01-07: 1 via OPHTHALMIC

## 2013-01-07 MED ORDER — HYDROMORPHONE HCL PF 1 MG/ML IJ SOLN
0.2500 mg | INTRAMUSCULAR | Status: DC | PRN
  Administered 2013-01-07 (×2): 0.5 mg via INTRAVENOUS

## 2013-01-07 MED ORDER — MIDAZOLAM HCL 2 MG/2ML IJ SOLN: 0.5000 mg | Freq: Once | INTRAMUSCULAR | Status: DC | PRN

## 2013-01-07 MED ORDER — 0.9 % SODIUM CHLORIDE (POUR BTL) OPTIME
TOPICAL | Status: DC | PRN
  Administered 2013-01-07 (×2): 1000 mL

## 2013-01-07 MED ORDER — GLYCOPYRROLATE 0.2 MG/ML IJ SOLN
INTRAMUSCULAR | Status: DC | PRN
  Administered 2013-01-07 (×2): 0.1 mg via INTRAVENOUS
  Administered 2013-01-07: 0.6 mg via INTRAVENOUS

## 2013-01-07 MED ORDER — MEPERIDINE HCL 25 MG/ML IJ SOLN: 6.2500 mg | INTRAMUSCULAR | Status: DC | PRN

## 2013-01-07 MED ORDER — PROMETHAZINE HCL 25 MG/ML IJ SOLN: 6.2500 mg | INTRAMUSCULAR | Status: DC | PRN

## 2013-01-07 MED ORDER — VECURONIUM BROMIDE 10 MG IV SOLR
INTRAVENOUS | Status: DC | PRN
  Administered 2013-01-07 (×2): 1 mg via INTRAVENOUS

## 2013-01-07 MED ORDER — MORPHINE SULFATE (PF) 1 MG/ML IV SOLN
INTRAVENOUS | Status: DC
  Administered 2013-01-07: 6.7 mg via INTRAVENOUS

## 2013-01-07 MED ORDER — HYDRALAZINE HCL 20 MG/ML IJ SOLN
INTRAMUSCULAR | Status: AC
  Filled 2013-01-07: qty 1

## 2013-01-07 MED ORDER — DIPHENHYDRAMINE HCL 50 MG/ML IJ SOLN: 12.5000 mg | Freq: Four times a day (QID) | INTRAMUSCULAR | Status: DC | PRN

## 2013-01-07 MED ORDER — HYDROMORPHONE HCL PF 1 MG/ML IJ SOLN
INTRAMUSCULAR | Status: AC
  Filled 2013-01-07: qty 1

## 2013-01-07 MED ORDER — OXYCODONE HCL 5 MG PO TABS: 5.0000 mg | ORAL_TABLET | Freq: Once | ORAL | Status: DC | PRN

## 2013-01-07 MED ORDER — ALVIMOPAN 12 MG PO CAPS
12.0000 mg | ORAL_CAPSULE | Freq: Once | ORAL | Status: DC
  Filled 2013-01-07: qty 1

## 2013-01-07 MED ORDER — METRONIDAZOLE IN NACL 5-0.79 MG/ML-% IV SOLN
500.0000 mg | INTRAVENOUS | Status: AC
  Administered 2013-01-07: 500 mg via INTRAVENOUS
  Filled 2013-01-07: qty 100

## 2013-01-07 MED ORDER — ROCURONIUM BROMIDE 100 MG/10ML IV SOLN
INTRAVENOUS | Status: DC | PRN
  Administered 2013-01-07: 50 mg via INTRAVENOUS

## 2013-01-07 MED ORDER — LACTATED RINGERS IV SOLN
INTRAVENOUS | Status: DC
  Administered 2013-01-07 – 2013-01-08 (×2): via INTRAVENOUS

## 2013-01-07 MED ORDER — PROPOFOL 10 MG/ML IV BOLUS
INTRAVENOUS | Status: DC | PRN
  Administered 2013-01-07: 200 mg via INTRAVENOUS

## 2013-01-07 MED ORDER — ONDANSETRON HCL 4 MG/2ML IJ SOLN
INTRAMUSCULAR | Status: DC | PRN
  Administered 2013-01-07: 4 mg via INTRAVENOUS

## 2013-01-07 MED ORDER — FENTANYL CITRATE 0.05 MG/ML IJ SOLN
INTRAMUSCULAR | Status: DC | PRN
  Administered 2013-01-07: 100 ug via INTRAVENOUS
  Administered 2013-01-07 (×7): 50 ug via INTRAVENOUS
  Administered 2013-01-07: 250 ug via INTRAVENOUS
  Administered 2013-01-07: 50 ug via INTRAVENOUS

## 2013-01-07 MED ORDER — MORPHINE SULFATE (PF) 1 MG/ML IV SOLN
INTRAVENOUS | Status: AC
  Filled 2013-01-07: qty 25

## 2013-01-07 MED ORDER — HYDRALAZINE HCL 20 MG/ML IJ SOLN
10.0000 mg | INTRAMUSCULAR | Status: DC | PRN
  Administered 2013-01-07 – 2013-01-11 (×13): 10 mg via INTRAVENOUS
  Filled 2013-01-07 (×13): qty 1

## 2013-01-07 SURGICAL SUPPLY — 50 items
BLADE SURG ROTATE 9660 (MISCELLANEOUS) IMPLANT
CANISTER SUCTION 2500CC (MISCELLANEOUS) ×2 IMPLANT
CHLORAPREP W/TINT 26ML (MISCELLANEOUS) ×2 IMPLANT
CLOTH BEACON ORANGE TIMEOUT ST (SAFETY) ×2 IMPLANT
COVER SURGICAL LIGHT HANDLE (MISCELLANEOUS) ×4 IMPLANT
DRAPE LAPAROSCOPIC ABDOMINAL (DRAPES) ×2 IMPLANT
DRAPE UTILITY 15X26 W/TAPE STR (DRAPE) ×4 IMPLANT
DRAPE WARM FLUID 44X44 (DRAPE) ×2 IMPLANT
DRSG OPSITE POSTOP 4X8 (GAUZE/BANDAGES/DRESSINGS) ×2 IMPLANT
ELECT BLADE 6.5 EXT (BLADE) IMPLANT
ELECT CAUTERY BLADE 6.4 (BLADE) ×2 IMPLANT
ELECT REM PT RETURN 9FT ADLT (ELECTROSURGICAL) ×2
ELECTRODE REM PT RTRN 9FT ADLT (ELECTROSURGICAL) ×1 IMPLANT
GLOVE BIO SURGEON STRL SZ 6.5 (GLOVE) IMPLANT
GLOVE BIOGEL PI IND STRL 6.5 (GLOVE) ×1 IMPLANT
GLOVE BIOGEL PI IND STRL 7.0 (GLOVE) ×2 IMPLANT
GLOVE BIOGEL PI IND STRL 8 (GLOVE) ×1 IMPLANT
GLOVE BIOGEL PI INDICATOR 6.5 (GLOVE) ×1
GLOVE BIOGEL PI INDICATOR 7.0 (GLOVE) ×2
GLOVE BIOGEL PI INDICATOR 8 (GLOVE) ×1
GLOVE SURG SS PI 6.5 STRL IVOR (GLOVE) ×2 IMPLANT
GLOVE SURG SS PI 7.0 STRL IVOR (GLOVE) ×8 IMPLANT
GLOVE SURG SS PI 7.5 STRL IVOR (GLOVE) ×2 IMPLANT
GOWN PREVENTION PLUS XXLARGE (GOWN DISPOSABLE) ×2 IMPLANT
GOWN STRL NON-REIN LRG LVL3 (GOWN DISPOSABLE) ×6 IMPLANT
GOWN STRL REIN XL XLG (GOWN DISPOSABLE) ×2 IMPLANT
KIT BASIN OR (CUSTOM PROCEDURE TRAY) ×2 IMPLANT
KIT ROOM TURNOVER OR (KITS) ×2 IMPLANT
LIGASURE IMPACT 36 18CM CVD LR (INSTRUMENTS) ×2 IMPLANT
NS IRRIG 1000ML POUR BTL (IV SOLUTION) ×4 IMPLANT
PACK GENERAL/GYN (CUSTOM PROCEDURE TRAY) ×2 IMPLANT
PAD ARMBOARD 7.5X6 YLW CONV (MISCELLANEOUS) ×2 IMPLANT
RELOAD PROXIMATE 75MM BLUE (ENDOMECHANICALS) ×4 IMPLANT
SPECIMEN JAR LARGE (MISCELLANEOUS) IMPLANT
SPONGE LAP 18X18 X RAY DECT (DISPOSABLE) ×2 IMPLANT
STAPLER PROXIMATE 75MM BLUE (STAPLE) ×2 IMPLANT
STAPLER VISISTAT 35W (STAPLE) ×2 IMPLANT
SUCTION POOLE TIP (SUCTIONS) ×2 IMPLANT
SUT PDS AB 1 TP1 96 (SUTURE) ×4 IMPLANT
SUT SILK 2 0 SH CR/8 (SUTURE) ×2 IMPLANT
SUT SILK 2 0 TIES 10X30 (SUTURE) ×2 IMPLANT
SUT SILK 3 0 SH CR/8 (SUTURE) ×6 IMPLANT
SUT SILK 3 0 TIES 10X30 (SUTURE) ×2 IMPLANT
SUT VIC AB 2-0 SH 18 (SUTURE) ×2 IMPLANT
SUT VICRYL 4-0 PS2 18IN ABS (SUTURE) ×2 IMPLANT
TOWEL OR 17X26 10 PK STRL BLUE (TOWEL DISPOSABLE) ×2 IMPLANT
TRAY FOLEY BAG SILVER LF 14FR (CATHETERS) ×2 IMPLANT
TRAY FOLEY CATH 14FRSI W/METER (CATHETERS) IMPLANT
WATER STERILE IRR 1000ML POUR (IV SOLUTION) IMPLANT
YANKAUER SUCT BULB TIP NO VENT (SUCTIONS) ×2 IMPLANT

## 2013-01-07 NOTE — Progress Notes (Signed)
Pt refused to have NGT placed.

## 2013-01-07 NOTE — Anesthesia Preprocedure Evaluation (Addendum)
Anesthesia Evaluation  Patient identified by MRN, date of birth, ID band Patient awake    Reviewed: Allergy & Precautions, H&P , NPO status , Patient's Chart, lab work & pertinent test results, reviewed documented beta blocker date and time   History of Anesthesia Complications Negative for: history of anesthetic complications  Airway Mallampati: II TM Distance: >3 FB Neck ROM: Full    Dental  (+) Dental Advisory Given and Teeth Intact   Pulmonary Current Smoker,  breath sounds clear to auscultation  Pulmonary exam normal       Cardiovascular hypertension, Pt. on home beta blockers and Pt. on medications Rhythm:Regular Rate:Normal     Neuro/Psych  Headaches, PSYCHIATRIC DISORDERS Depression    GI/Hepatic GERD-  ,N/v with bowel obstruction   Endo/Other  Morbid obesity  Renal/GU      Musculoskeletal   Abdominal (+) + obese,   Peds  Hematology   Anesthesia Other Findings RSD/ complex pain syndrome   Reproductive/Obstetrics                          Anesthesia Physical Anesthesia Plan  ASA: III  Anesthesia Plan: General   Post-op Pain Management:    Induction: Intravenous and Rapid sequence  Airway Management Planned: Oral ETT  Additional Equipment:   Intra-op Plan:   Post-operative Plan: Extubation in OR  Informed Consent: I have reviewed the patients History and Physical, chart, labs and discussed the procedure including the risks, benefits and alternatives for the proposed anesthesia with the patient or authorized representative who has indicated his/her understanding and acceptance.   Dental advisory given  Plan Discussed with: Anesthesiologist, Surgeon and CRNA  Anesthesia Plan Comments: (Plan routine monitors, GETA with RSI)       Anesthesia Quick Evaluation

## 2013-01-07 NOTE — Progress Notes (Signed)
<principal problem not specified>  Subjective: Pt's CT shows returning SBO despite liquid diet.  Vomited last night.  Failed NG placement.  Feels better this am.  Objective: Vital signs in last 24 hours: Temp:  [97.8 F (36.6 C)-98.6 F (37 C)] 98.6 F (37 C) (05/28 0535) Pulse Rate:  [66-98] 66 (05/28 0535) Resp:  [16-21] 20 (05/28 0535) BP: (133-189)/(107-128) 133/107 mmHg (05/28 0535) SpO2:  [93 %-98 %] 95 % (05/28 0535) Weight:  [212 lb 11.2 oz (96.48 kg)] 212 lb 11.2 oz (96.48 kg) (05/27 2105) Last BM Date: 12/23/12  Intake/Output from previous day: 05/27 0701 - 05/28 0700 In: 895 [I.V.:895] Out: 1900 [Urine:1900] Intake/Output this shift:    General appearance: alert and cooperative GI: soft, non-tender; bowel sounds normal; no masses,  no organomegaly  Lab Results:  Results for orders placed during the hospital encounter of 01/06/13 (from the past 24 hour(s))  CBC WITH DIFFERENTIAL     Status: Abnormal   Collection Time    01/06/13  3:43 PM      Result Value Range   WBC 6.2  4.0 - 10.5 K/uL   RBC 5.67 (*) 3.87 - 5.11 MIL/uL   Hemoglobin 16.2 (*) 12.0 - 15.0 g/dL   HCT 96.0  45.4 - 09.8 %   MCV 80.2  78.0 - 100.0 fL   MCH 28.6  26.0 - 34.0 pg   MCHC 35.6  30.0 - 36.0 g/dL   RDW 11.9  14.7 - 82.9 %   Platelets 282  150 - 400 K/uL   Neutrophils Relative % 51  43 - 77 %   Neutro Abs 3.1  1.7 - 7.7 K/uL   Lymphocytes Relative 38  12 - 46 %   Lymphs Abs 2.4  0.7 - 4.0 K/uL   Monocytes Relative 9  3 - 12 %   Monocytes Absolute 0.5  0.1 - 1.0 K/uL   Eosinophils Relative 2  0 - 5 %   Eosinophils Absolute 0.1  0.0 - 0.7 K/uL   Basophils Relative 1  0 - 1 %   Basophils Absolute 0.0  0.0 - 0.1 K/uL  COMPREHENSIVE METABOLIC PANEL     Status: Abnormal   Collection Time    01/06/13  3:43 PM      Result Value Range   Sodium 136  135 - 145 mEq/L   Potassium 4.0  3.5 - 5.1 mEq/L   Chloride 101  96 - 112 mEq/L   CO2 22  19 - 32 mEq/L   Glucose, Bld 102 (*) 70 - 99  mg/dL   BUN 7  6 - 23 mg/dL   Creatinine, Ser 5.62  0.50 - 1.10 mg/dL   Calcium 13.0  8.4 - 86.5 mg/dL   Total Protein 7.8  6.0 - 8.3 g/dL   Albumin 3.7  3.5 - 5.2 g/dL   AST 85 (*) 0 - 37 U/L   ALT 61 (*) 0 - 35 U/L   Alkaline Phosphatase 165 (*) 39 - 117 U/L   Total Bilirubin 0.3  0.3 - 1.2 mg/dL   GFR calc non Af Amer >90  >90 mL/min   GFR calc Af Amer >90  >90 mL/min  LIPASE, BLOOD     Status: Abnormal   Collection Time    01/06/13  3:43 PM      Result Value Range   Lipase 10 (*) 11 - 59 U/L  URINALYSIS, ROUTINE W REFLEX MICROSCOPIC     Status: Abnormal   Collection  Time    01/06/13  4:20 PM      Result Value Range   Color, Urine AMBER (*) YELLOW   APPearance HAZY (*) CLEAR   Specific Gravity, Urine 1.038 (*) 1.005 - 1.030   pH 5.5  5.0 - 8.0   Glucose, UA NEGATIVE  NEGATIVE mg/dL   Hgb urine dipstick NEGATIVE  NEGATIVE   Bilirubin Urine MODERATE (*) NEGATIVE   Ketones, ur 15 (*) NEGATIVE mg/dL   Protein, ur 161 (*) NEGATIVE mg/dL   Urobilinogen, UA 1.0  0.0 - 1.0 mg/dL   Nitrite NEGATIVE  NEGATIVE   Leukocytes, UA TRACE (*) NEGATIVE  URINE MICROSCOPIC-ADD ON     Status: Abnormal   Collection Time    01/06/13  4:20 PM      Result Value Range   Squamous Epithelial / LPF FEW (*) RARE   WBC, UA 3-6  <3 WBC/hpf   Bacteria, UA FEW (*) RARE   Casts HYALINE CASTS (*) NEGATIVE   Urine-Other MUCOUS PRESENT    BASIC METABOLIC PANEL     Status: Abnormal   Collection Time    01/07/13  5:07 AM      Result Value Range   Sodium 136  135 - 145 mEq/L   Potassium 4.5  3.5 - 5.1 mEq/L   Chloride 101  96 - 112 mEq/L   CO2 27  19 - 32 mEq/L   Glucose, Bld 122 (*) 70 - 99 mg/dL   BUN 5 (*) 6 - 23 mg/dL   Creatinine, Ser 0.96  0.50 - 1.10 mg/dL   Calcium 9.9  8.4 - 04.5 mg/dL   GFR calc non Af Amer >90  >90 mL/min   GFR calc Af Amer >90  >90 mL/min  CBC     Status: Abnormal   Collection Time    01/07/13  5:07 AM      Result Value Range   WBC 5.1  4.0 - 10.5 K/uL   RBC 5.45  (*) 3.87 - 5.11 MIL/uL   Hemoglobin 15.5 (*) 12.0 - 15.0 g/dL   HCT 40.9  81.1 - 91.4 %   MCV 80.6  78.0 - 100.0 fL   MCH 28.4  26.0 - 34.0 pg   MCHC 35.3  30.0 - 36.0 g/dL   RDW 78.2  95.6 - 21.3 %   Platelets 297  150 - 400 K/uL     Studies/Results Radiology     MEDS, Scheduled . enoxaparin (LOVENOX) injection  40 mg Subcutaneous Q24H  . lisinopril  20 mg Oral Daily  . metoprolol  50 mg Oral BID  . 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  I believe her only option is a repeat surgery.  We discussed this in detail.  We discussed the risks of surgery, which are mainly bleeding, infection, damage to adjacent structures, hernia and complications associated with anesthesia.  I believe she understands these risks and has agreed to proceed.     LOS: 1 day    Vanita Panda, MD [facility], [place] [phone]   01/07/2013 7:13 AM

## 2013-01-07 NOTE — Op Note (Signed)
01/06/2013 - 01/07/2013  4:16 PM  PATIENT:  Emma Munoz  47 y.o. female  Patient Care Team: Wynona Dove, MD as PCP - General (Internal Medicine) Shayne Alken, MD (Physical Medicine and Rehabilitation) Beverley Fiedler, MD as Consulting Physician (Gastroenterology)  PRE-OPERATIVE DIAGNOSIS:  SMALL BOWEL OBSTRUCTION  POST-OPERATIVE DIAGNOSIS:  SMALL BOWEL OBSTRUCTION  PROCEDURE:  Procedure(s): EXPLORATORY LAPAROTOMY,  LYSIS OF ADHESIONS , SMALL BOWEL RESECTION  SURGEON:  Surgeon(s): Romie Levee, MD Axel Filler, MD  ASSISTANT: Derrell Lolling   ANESTHESIA:   general  EBL: 75ml  Total I/O In: 1000 [I.V.:1000] Out: 325 [Urine:250; Blood:75]  Delay start of Pharmacological VTE agent (>24hrs) due to surgical blood loss or risk of bleeding:  no  DRAINS: none   SPECIMEN:  Source of Specimen:  small bowel stricture  DISPOSITION OF SPECIMEN:  PATHOLOGY  COUNTS:  YES  PLAN OF CARE: Patient admitted  PATIENT DISPOSITION:  PACU - hemodynamically stable.  INDICATION: this is a 47 year old female who presented to the emergency department last night with a recurrent small bowel obstruction. This was despite being on a liquid diet. She has a known small bowel stricture and since she has failed conservative management, it was felt that surgical resection was the most logical answer. The risk and benefits of surgery were excellent the patient per the OR consent was signed and placed on chart the  OR FINDINGS: small bowel stricture, dense adhesions.  DESCRIPTION: The patient was identified in the preoperative holding area and taken to the OR where they were laid supine on the operating room table. Gen. anesthesia was smoothly induced.  The patient was then prepped and draped in the usual sterile fashion. A surgical timeout was performed indicating the correct patient, procedure, positioning and preoperative antibiotics. SCDs were noted to be in place and functioning prior to  the operation.  An incision was made through the previous periumbilical scar. This is taken down through subcutaneous tissue using Bovie electrocautery. Hernia sac was identified at midline and elevated with 2 hemostats. Sharp dissection allowed entrance into the abdomen. There were a couple loops of small bowel that were adherent to the midline. These are taken down using sharp dissection. The remaining adhesions to the abdominal wall were taken down using blunt dissection and Bovie electric cautery. Once the entire incision was opened I began to mobilize the entire small bowel. All adhesions were lysed. The anastomosis was adherent to the root of the mesentery. This was bluntly dissected free. After that was completed we are able to mobilize the anastomosis upwards. There did appear to be a stricture palpated here. The small bowel was then transected distally and proximally to this. This was done using a GIA blue load 75 mm stapler. The 2 ends of bowel were reapproximated using 3-0 silk sutures. Enterotomy was made and both ends and the 75 mm blue load stapler was used to create anastomosis. There was no bleeding internally. The common enterotomy channel was closed using interrupted 3-0 silk sutures. The mesenteric defect was then closed using a running 2-0 silk suture. After this was completed the anastomosis was placed back into the abdomen. The anastomosis was approximately 3-1/2 fingerbreadths in width. An anti-tension suture was placed prior to placing the anastomoses back in the abdomen. The omentum was then brought over the small bowel. The hernia sac was removed from the abdominal wall. The fascia was then closed using 2-0 looped PDS sutures.  The subcutaneous tissue was then closed using interrupted2-0 Vicryl sutures.  The skin was closed using a running 4-0 Vicryl suture. A sterile dressing was applied. The patient was awakened from anesthesia and sent to the post anesthesia care unit in stable  condition. All counts were correct per operating room staff.

## 2013-01-07 NOTE — Transfer of Care (Signed)
Immediate Anesthesia Transfer of Care Note  Patient: Emma Munoz  Procedure(s) Performed: Procedure(s): EXPLORATORY LAPAROTOMY,  LYSIS OF ADHESIONS , SMALL BOWEL RESECTION (N/A)  Patient Location: PACU  Anesthesia Type:General  Level of Consciousness: awake, alert  and oriented  Airway & Oxygen Therapy: Patient Spontanous Breathing and Patient connected to face mask oxygen  Post-op Assessment: Report given to PACU RN, Post -op Vital signs reviewed and stable and Patient moving all extremities X 4  Post vital signs: Reviewed and stable  Complications: No apparent anesthesia complications

## 2013-01-07 NOTE — Anesthesia Postprocedure Evaluation (Signed)
  Anesthesia Post-op Note  Patient: Emma Munoz  Procedure(s) Performed: Procedure(s): EXPLORATORY LAPAROTOMY,  LYSIS OF ADHESIONS , SMALL BOWEL RESECTION (N/A)  Patient Location: PACU  Anesthesia Type:General  Level of Consciousness: awake, alert , oriented and patient cooperative  Airway and Oxygen Therapy: Patient Spontanous Breathing and Patient connected to nasal cannula oxygen  Post-op Pain: mild  Post-op Assessment: Post-op Vital signs reviewed, Patient's Cardiovascular Status Stable, Respiratory Function Stable, Patent Airway, No signs of Nausea or vomiting and Pain level controlled  Post-op Vital Signs: Reviewed and stable  Complications: No apparent anesthesia complications

## 2013-01-07 NOTE — Anesthesia Procedure Notes (Addendum)
Procedure Name: Intubation Date/Time: 01/07/2013 2:14 PM Performed by: Gayla Medicus Pre-anesthesia Checklist: Timeout performed, Patient identified, Emergency Drugs available, Suction available and Patient being monitored Patient Re-evaluated:Patient Re-evaluated prior to inductionOxygen Delivery Method: Circle system utilized Preoxygenation: Pre-oxygenation with 100% oxygen Intubation Type: IV induction, Rapid sequence and Cricoid Pressure applied Tube type: Oral Tube size: 7.5 mm Number of attempts: 1 Airway Equipment and Method: Stylet and Video-laryngoscopy Placement Confirmation: ETT inserted through vocal cords under direct vision,  positive ETCO2 and breath sounds checked- equal and bilateral Secured at: 21 cm Tube secured with: Tape Dental Injury: Teeth and Oropharynx as per pre-operative assessment

## 2013-01-07 NOTE — Preoperative (Signed)
Beta Blockers   Reason not to administer Beta Blockers:Not Applicable 

## 2013-01-07 NOTE — Progress Notes (Signed)
Pt was picked up at 1251. Jeannene Patella picked pt up and was informed about medication that were with chart for pt prior to procedure. Entereg 12mg  was to be given downstairs at least 30 mintues prior to procedure and Jeannene Patella was reminded of this when he came to pick up the patient.

## 2013-01-08 LAB — CBC
Hemoglobin: 16.5 g/dL — ABNORMAL HIGH (ref 12.0–15.0)
MCH: 28.5 pg (ref 26.0–34.0)
Platelets: 325 10*3/uL (ref 150–400)
RBC: 5.78 MIL/uL — ABNORMAL HIGH (ref 3.87–5.11)
WBC: 17.1 10*3/uL — ABNORMAL HIGH (ref 4.0–10.5)

## 2013-01-08 LAB — BASIC METABOLIC PANEL
CO2: 23 mEq/L (ref 19–32)
Chloride: 97 mEq/L (ref 96–112)
Glucose, Bld: 127 mg/dL — ABNORMAL HIGH (ref 70–99)
Sodium: 132 mEq/L — ABNORMAL LOW (ref 135–145)

## 2013-01-08 MED ORDER — CLONIDINE HCL 0.2 MG/24HR TD PTWK
0.2000 mg | MEDICATED_PATCH | TRANSDERMAL | Status: DC
  Administered 2013-01-08: 0.2 mg via TRANSDERMAL
  Filled 2013-01-08: qty 1

## 2013-01-08 MED ORDER — MORPHINE SULFATE (PF) 1 MG/ML IV SOLN: INTRAVENOUS | Status: DC

## 2013-01-08 MED ORDER — MORPHINE SULFATE (PF) 1 MG/ML IV SOLN
INTRAVENOUS | Status: DC
  Administered 2013-01-08 (×2): via INTRAVENOUS
  Administered 2013-01-08: 10.58 mg via INTRAVENOUS
  Administered 2013-01-08: 09:00:00 via INTRAVENOUS
  Administered 2013-01-08: 17.48 mg via INTRAVENOUS
  Administered 2013-01-09 (×2): via INTRAVENOUS
  Administered 2013-01-09: 22.15 mg via INTRAVENOUS
  Administered 2013-01-09: 6.45 mg via INTRAVENOUS
  Administered 2013-01-09: 16.82 mg via INTRAVENOUS
  Administered 2013-01-09: 14.1 mL via INTRAVENOUS
  Administered 2013-01-10: 01:00:00 via INTRAVENOUS
  Administered 2013-01-10: 10.95 mg via INTRAVENOUS
  Filled 2013-01-08 (×5): qty 25

## 2013-01-08 MED ORDER — KCL IN DEXTROSE-NACL 20-5-0.45 MEQ/L-%-% IV SOLN
INTRAVENOUS | Status: DC
  Administered 2013-01-08 – 2013-01-10 (×2): via INTRAVENOUS
  Filled 2013-01-08 (×6): qty 1000

## 2013-01-08 NOTE — Progress Notes (Signed)
1 Day Post-Op ex lap, SBR Subjective: Pain overnight, slightly better this am, still hypertensive, doesn't feel much relief with PCA, good UOP, min NG output  Objective: Vital signs in last 24 hours: Temp:  [98 F (36.7 C)-99 F (37.2 C)] 99 F (37.2 C) (05/29 0615) Pulse Rate:  [63-91] 82 (05/29 0724) Resp:  [10-97] 24 (05/29 0816) BP: (136-199)/(79-120) 173/111 mmHg (05/29 0724) SpO2:  [95 %-100 %] 95 % (05/29 0816) FiO2 (%):  [28 %] 28 % (05/29 0347)   Intake/Output from previous day: 05/28 0701 - 05/29 0700 In: 2701.9 [I.V.:2671.9; NG/GT:30] Out: 2825 [Urine:2700; Emesis/NG output:50; Blood:75] Intake/Output this shift:     General appearance: alert, cooperative and fatigued GI: soft, appropriately tender  Incision: healing well, slight bloody drainage at base of incision   Lab Results:   Recent Labs  01/06/13 1543 01/07/13 0507  WBC 6.2 5.1  HGB 16.2* 15.5*  HCT 45.5 43.9  PLT 282 297   BMET  Recent Labs  01/06/13 1543 01/07/13 0507  NA 136 136  K 4.0 4.5  CL 101 101  CO2 22 27  GLUCOSE 102* 122*  BUN 7 5*  CREATININE 0.58 0.59  CALCIUM 10.3 9.9   PT/INR No results found for this basename: LABPROT, INR,  in the last 72 hours ABG No results found for this basename: PHART, PCO2, PO2, HCO3,  in the last 72 hours  MEDS, Scheduled . enoxaparin (LOVENOX) injection  40 mg Subcutaneous Q24H  . lisinopril  20 mg Oral Daily  . metoprolol  10 mg Intravenous Q6H  . morphine   Intravenous Q4H  . pantoprazole (PROTONIX) IV  40 mg Intravenous QHS    Studies/Results: Ct Abdomen Pelvis W Contrast  01/06/2013   *RADIOLOGY REPORT*  Clinical Data: Abdominal pain, nausea  CT ABDOMEN AND PELVIS WITH CONTRAST  Technique:  Multidetector CT imaging of the abdomen and pelvis was performed following the standard protocol during bolus administration of intravenous contrast.  Contrast: OMNIPAQUE IOHEXOL 300 MG/ML  SOLN  Comparison: 12/20/2012  Findings: Minimal  basilar atelectasis.  No pericardial or pleural effusion.  No hiatal hernia.  Abdomen:  Prior cholecystectomy evident.  Liver, biliary system, pancreas, spleen, adrenal glands, and kidneys are stable and within normal limits for age.  There is a small fat containing midline ventral hernia as before, unchanged.  Within the lower abdomen extending into the pelvis, there are several dilated loops of fluid distended small bowel with a transition point at a small bowel staple line.  This is consistent with an anastomotic stricture with small bowel obstruction. Pattern is similar to 12/20/2012.  No evidence of free air, bowel wall thickening, or pneumatosis.  Distal small bowel and colon are decompressed.  Colon is stool-filled.  No abdominal free fluid, fluid collection, hemorrhage, adenopathy, or abscess.  Pelvis:  Bilateral ovarian cysts noted, measuring 5.0 x 3.8 cm on the right and 5.5 x 5.2 cm on the left, image 75.  No significant pelvic free fluid.  Urinary bladder is under distended.  No pelvic abscess, fluid collection, hemorrhage, inguinal abnormality, or hernia.  Spinal cord stimulator noted.  Degenerative changes of the lower lumbar spine and SI joints.  Prior hysterectomy evident.  IMPRESSION: Recurrent mid small bowel obstruction with a transition at the small bowel anastomosis staple line suspicious for an anastomotic stricture.  Negative for free air or abscess  Stable midline abdominal wall fat containing ventral hernia  Prior cholecystectomy and hysterectomy.  Bilateral ovarian cysts.   Original  Report Authenticated By: Judie Petit. Miles Costain, M.D.   Dg Abd 2 Views  01/07/2013   *RADIOLOGY REPORT*  Clinical Data: Follow up small bowel obstruction  ABDOMEN - 2 VIEW  Comparison: 01/06/2013  Findings: Surgical clips are noted in the right upper quadrant of the abdomen.  Enteric contrast material opacifies the normal caliber colon.  No dilated small bowel loops identified.  No fluid levels identified.  IMPRESSION:  1.   No plain film radiographic evidence of small bowel obstruction.   Original Report Authenticated By: Signa Kell, M.D.   Dg Abd Acute W/chest  01/06/2013   *RADIOLOGY REPORT*  Clinical Data: Abdominal pain, nausea, history of small bowel obstruction  ACUTE ABDOMEN SERIES (ABDOMEN 2 VIEW & CHEST 1 VIEW)  Comparison: CT abdomen/pelvis dated 12/20/2012  Findings: Lungs are essentially clear.  No focal consolidation. No pleural effusion or pneumothorax.  The heart is normal in size.  Nonobstructive bowel gas pattern.  No evidence of free air under the diaphragm on the upright view.  Moderate stool throughout the colon.  IMPRESSION: No evidence of acute cardiopulmonary disease.  No evidence of small bowel obstruction or free air.  Moderate colonic stool burden, suggesting constipation.   Original Report Authenticated By: Charline Bills, M.D.    Assessment: s/p Procedure(s): EXPLORATORY LAPAROTOMY,  LYSIS OF ADHESIONS , SMALL BOWEL RESECTION Patient Active Problem List   Diagnosis Date Noted  . Hypercalcemia 10/13/2012  . Nausea and vomiting 10/10/2012  . Infected hematoma following procedure 05/15/2012  . Constipation   . IBS (irritable bowel syndrome)   . 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 Fatigue 12/21/2011  . Abdominal pain 12/06/2011  . Open abdominal wall wound 12/06/2011  . Preop cardiovascular exam 11/16/2011  . Hypertension   . Primary hyperparathyroidism 11/01/2011  . Nausea 10/19/2011  . MIGRAINES, HX OF 07/22/2009  . PARESTHESIA 07/14/2008  . ALLERGIC RHINITIS 05/19/2008  . FATIGUE 06/18/2007  . HYPERLIPIDEMIA 06/17/2007  . METABOLIC SYNDROME X 06/17/2007  . OBESITY 06/17/2007  . ANEMIA-NOS 06/17/2007  . TOBACCO ABUSE 06/17/2007  . DEPRESSION 06/17/2007  . 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 HYPERTENSION 06/17/2007  . GERD 06/17/2007  . GASTROPARESIS 06/17/2007  . HERNIATED CERVICAL DISC 06/17/2007  . INSOMNIA 06/17/2007    Expected post op course, significant  pain due to surgery and narcotic dependence  Plan: d/c foley Cont NG today, Cont NPO Will increase PCA settings to control pain and BP better   LOS: 2 days     .Vanita Panda, MD [facility] Surgery, [place] [phone]   01/08/2013 8:24 AM

## 2013-01-09 ENCOUNTER — Encounter (HOSPITAL_COMMUNITY): Payer: Self-pay | Admitting: General Surgery

## 2013-01-09 LAB — CBC
HCT: 43.9 % (ref 36.0–46.0)
Hemoglobin: 15 g/dL (ref 12.0–15.0)
MCH: 27.6 pg (ref 26.0–34.0)
MCHC: 34.2 g/dL (ref 30.0–36.0)
MCV: 80.7 fL (ref 78.0–100.0)
RBC: 5.44 MIL/uL — ABNORMAL HIGH (ref 3.87–5.11)

## 2013-01-09 LAB — BASIC METABOLIC PANEL
BUN: 5 mg/dL — ABNORMAL LOW (ref 6–23)
CO2: 24 mEq/L (ref 19–32)
Chloride: 97 mEq/L (ref 96–112)
Glucose, Bld: 131 mg/dL — ABNORMAL HIGH (ref 70–99)
Potassium: 3.7 mEq/L (ref 3.5–5.1)
Sodium: 130 mEq/L — ABNORMAL LOW (ref 135–145)

## 2013-01-09 MED ORDER — FLUCONAZOLE 200 MG PO TABS
200.0000 mg | ORAL_TABLET | Freq: Every day | ORAL | Status: DC
  Filled 2013-01-09: qty 1

## 2013-01-09 MED ORDER — POLYETHYLENE GLYCOL 3350 17 G PO PACK
17.0000 g | PACK | Freq: Two times a day (BID) | ORAL | Status: DC
  Administered 2013-01-09 – 2013-01-12 (×6): 17 g via ORAL
  Filled 2013-01-09 (×11): qty 1

## 2013-01-09 NOTE — Progress Notes (Signed)
2 Days Post-Op ex lap, SBR Subjective: Pain control better, still hypertensive but also slightly better, good UOP, min NG output, no flatus  Objective: Vital signs in last 24 hours: Temp:  [98 F (36.7 C)-99.3 F (37.4 C)] 98.8 F (37.1 C) (05/30 0522) Pulse Rate:  [85-110] 110 (05/30 0522) Resp:  [20-32] 21 (05/30 0610) BP: (129-197)/(85-115) 168/100 mmHg (05/30 0522) SpO2:  [94 %-99 %] 97 % (05/30 0610)   Intake/Output from previous day: 05/29 0701 - 05/30 0700 In: 1037 [I.V.:1017; NG/GT:20] Out: 1280 [Urine:950; Emesis/NG output:330] Intake/Output this shift:     General appearance: alert, cooperative and fatigued GI: soft, appropriately tender  Incision: healing well, slight drainage at base of incision   Lab Results:   Recent Labs  01/08/13 0755 01/09/13 0650  WBC 17.1* 13.1*  HGB 16.5* 15.0  HCT 46.2* 43.9  PLT 325 283   BMET  Recent Labs  01/08/13 0755 01/09/13 0650  NA 132* 130*  K 3.7 3.7  CL 97 97  CO2 23 24  GLUCOSE 127* 131*  BUN 6 5*  CREATININE 0.52 0.43*  CALCIUM 10.3 11.0*   PT/INR No results found for this basename: LABPROT, INR,  in the last 72 hours ABG No results found for this basename: PHART, PCO2, PO2, HCO3,  in the last 72 hours  MEDS, Scheduled . cloNIDine  0.2 mg Transdermal Weekly  . enoxaparin (LOVENOX) injection  40 mg Subcutaneous Q24H  . lisinopril  20 mg Oral Daily  . metoprolol  10 mg Intravenous Q6H  . morphine   Intravenous Q4H  . pantoprazole (PROTONIX) IV  40 mg Intravenous QHS  . polyethylene glycol  17 g Oral BID    Studies/Results: No results found.  Assessment: s/p Procedure(s): EXPLORATORY LAPAROTOMY,  LYSIS OF ADHESIONS , SMALL BOWEL RESECTION Patient Active Problem List   Diagnosis Date Noted  . Hypercalcemia 10/13/2012  . Nausea and vomiting 10/10/2012  . Infected hematoma following procedure 05/15/2012  . Constipation   . IBS (irritable bowel syndrome)   . 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  Fatigue 12/21/2011  . Abdominal pain 12/06/2011  . Open abdominal wall wound 12/06/2011  . Preop cardiovascular exam 11/16/2011  . Hypertension   . Primary hyperparathyroidism 11/01/2011  . Nausea 10/19/2011  . MIGRAINES, HX OF 07/22/2009  . PARESTHESIA 07/14/2008  . ALLERGIC RHINITIS 05/19/2008  . FATIGUE 06/18/2007  . HYPERLIPIDEMIA 06/17/2007  . METABOLIC SYNDROME X 06/17/2007  . OBESITY 06/17/2007  . ANEMIA-NOS 06/17/2007  . TOBACCO ABUSE 06/17/2007  . DEPRESSION 06/17/2007  . 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 HYPERTENSION 06/17/2007  . GERD 06/17/2007  . GASTROPARESIS 06/17/2007  . HERNIATED CERVICAL DISC 06/17/2007  . INSOMNIA 06/17/2007    Expected post op course, significant pain due to surgery and narcotic dependence  Plan: d/c NG today, will start sips of clears Cont PCA. Will add in PO home narcotics once tolerating a diet Ambulate Will start miralax BID today for constipation   LOS: 3 days     .Vanita Panda, MD [facility] Surgery, [place] [phone]   01/09/2013 9:16 AM

## 2013-01-10 LAB — BASIC METABOLIC PANEL
CO2: 24 mEq/L (ref 19–32)
Creatinine, Ser: 0.45 mg/dL — ABNORMAL LOW (ref 0.50–1.10)
GFR calc Af Amer: >90 mL/min (ref >90)
Glucose, Bld: 125 mg/dL — ABNORMAL HIGH (ref 70–99)
Sodium: 136 mEq/L (ref 135–145)

## 2013-01-10 LAB — CBC
HCT: 43.7 % (ref 36.0–46.0)
Hemoglobin: 15.2 g/dL — ABNORMAL HIGH (ref 12.0–15.0)
RDW: 14 % (ref 11.5–15.5)
WBC: 11.3 10*3/uL — ABNORMAL HIGH (ref 4.0–10.5)

## 2013-01-10 MED ORDER — MORPHINE SULFATE (PF) 1 MG/ML IV SOLN
INTRAVENOUS | Status: DC
  Administered 2013-01-10: 18:00:00 via INTRAVENOUS
  Administered 2013-01-10: 15.11 mg via INTRAVENOUS
  Administered 2013-01-10: 12 mg via INTRAVENOUS
  Administered 2013-01-10: 11:00:00 via INTRAVENOUS
  Administered 2013-01-10: 3.94 mg via INTRAVENOUS
  Administered 2013-01-11: 15 mg via INTRAVENOUS
  Administered 2013-01-11: 12 mg via INTRAVENOUS
  Administered 2013-01-11: 6 mg via INTRAVENOUS
  Administered 2013-01-11: 17:00:00 via INTRAVENOUS
  Administered 2013-01-11: 4.88 mg via INTRAVENOUS
  Administered 2013-01-11: 28.22 mg via INTRAVENOUS
  Administered 2013-01-12: 6 mg via INTRAVENOUS
  Administered 2013-01-12: 9 mg via INTRAVENOUS
  Administered 2013-01-12: 12 mg via INTRAVENOUS
  Filled 2013-01-10 (×5): qty 25

## 2013-01-10 MED ORDER — DOCUSATE SODIUM 100 MG PO CAPS
100.0000 mg | ORAL_CAPSULE | Freq: Three times a day (TID) | ORAL | Status: DC
  Administered 2013-01-10 – 2013-01-12 (×9): 100 mg via ORAL
  Filled 2013-01-10 (×9): qty 1

## 2013-01-10 MED ORDER — MORPHINE SULFATE ER 30 MG PO TBCR
30.0000 mg | EXTENDED_RELEASE_TABLET | Freq: Two times a day (BID) | ORAL | Status: DC
  Administered 2013-01-10 – 2013-01-12 (×5): 30 mg via ORAL
  Filled 2013-01-10 (×5): qty 1

## 2013-01-10 MED ORDER — BISACODYL 10 MG RE SUPP: 10.0000 mg | Freq: Every day | RECTAL | Status: DC | PRN

## 2013-01-10 NOTE — Progress Notes (Signed)
3 Days Post-Op ex lap, SBR Subjective: Pain control better, still hypertensive but stable, no flatus, tolerated clears  Objective: Vital signs in last 24 hours: Temp:  [97.6 F (36.4 C)-99.8 F (37.7 C)] 98.8 F (37.1 C) (05/31 0623) Pulse Rate:  [83-111] 102 (05/31 0623) Resp:  [14-24] 19 (05/31 0623) BP: (161-191)/(95-117) 166/105 mmHg (05/31 0623) SpO2:  [95 %-100 %] 96 % (05/31 0623)   Intake/Output from previous day: 05/30 0701 - 05/31 0700 In: 1513.9 [P.O.:600; I.V.:913.9] Out: -  Intake/Output this shift:     General appearance: alert, cooperative and fatigued GI: soft, appropriately tender  Incision: healing well, slight drainage at base of incision   Lab Results:   Recent Labs  01/09/13 0650 01/10/13 0550  WBC 13.1* 11.3*  HGB 15.0 15.2*  HCT 43.9 43.7  PLT 283 297   BMET  Recent Labs  01/09/13 0650 01/10/13 0550  NA 130* 136  K 3.7 3.3*  CL 97 101  CO2 24 24  GLUCOSE 131* 125*  BUN 5* 6  CREATININE 0.43* 0.45*  CALCIUM 11.0* 11.4*   PT/INR No results found for this basename: LABPROT, INR,  in the last 72 hours ABG No results found for this basename: PHART, PCO2, PO2, HCO3,  in the last 72 hours  MEDS, Scheduled . cloNIDine  0.2 mg Transdermal Weekly  . docusate sodium  100 mg Oral TID  . enoxaparin (LOVENOX) injection  40 mg Subcutaneous Q24H  . lisinopril  20 mg Oral Daily  . metoprolol  10 mg Intravenous Q6H  . morphine  30 mg Oral Q12H  . morphine   Intravenous Q4H  . pantoprazole (PROTONIX) IV  40 mg Intravenous QHS  . polyethylene glycol  17 g Oral BID    Studies/Results: No results found.  Assessment: s/p Procedure(s): EXPLORATORY LAPAROTOMY,  LYSIS OF ADHESIONS , SMALL BOWEL RESECTION Patient Active Problem List   Diagnosis Date Noted  . Hypercalcemia 10/13/2012  . Nausea and vomiting 10/10/2012  . Infected hematoma following procedure 05/15/2012  . Constipation   . IBS (irritable bowel syndrome)   .  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 Fatigue 12/21/2011  . Abdominal pain 12/06/2011  . Open abdominal wall wound 12/06/2011  . Preop cardiovascular exam 11/16/2011  . Hypertension   . Primary hyperparathyroidism 11/01/2011  . Nausea 10/19/2011  . MIGRAINES, HX OF 07/22/2009  . PARESTHESIA 07/14/2008  . ALLERGIC RHINITIS 05/19/2008  . FATIGUE 06/18/2007  . HYPERLIPIDEMIA 06/17/2007  . METABOLIC SYNDROME X 06/17/2007  . OBESITY 06/17/2007  . ANEMIA-NOS 06/17/2007  . TOBACCO ABUSE 06/17/2007  . DEPRESSION 06/17/2007  . 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 HYPERTENSION 06/17/2007  . GERD 06/17/2007  . GASTROPARESIS 06/17/2007  . HERNIATED CERVICAL DISC 06/17/2007  . INSOMNIA 06/17/2007    Expected post op course, significant pain due to surgery and narcotic dependence  Plan: Will advance diet to [facility] Will restart MS Contin at home dose Cont PCA for breakthrough pain, will d/c basal rate Ambulate Cont miralax BID, will add colace TID   LOS: 4 days     .Vanita Panda, MD [facility] Surgery, [place] [phone]   01/10/2013 7:55 AM

## 2013-01-11 MED ORDER — CLONIDINE HCL 0.3 MG/24HR TD PTWK
0.3000 mg | MEDICATED_PATCH | TRANSDERMAL | Status: DC
  Administered 2013-01-11: 0.3 mg via TRANSDERMAL
  Filled 2013-01-11: qty 1

## 2013-01-11 MED ORDER — METOPROLOL TARTRATE 50 MG PO TABS
50.0000 mg | ORAL_TABLET | Freq: Two times a day (BID) | ORAL | Status: DC
  Administered 2013-01-11 – 2013-01-12 (×3): 50 mg via ORAL
  Filled 2013-01-11: qty 1
  Filled 2013-01-11: qty 2
  Filled 2013-01-11 (×3): qty 1

## 2013-01-11 MED ORDER — CYCLOBENZAPRINE HCL 10 MG PO TABS
30.0000 mg | ORAL_TABLET | Freq: Every day | ORAL | Status: DC
  Administered 2013-01-11 – 2013-01-12 (×2): 30 mg via ORAL
  Filled 2013-01-11 (×3): qty 3

## 2013-01-11 MED ORDER — MORPHINE SULFATE 15 MG PO TABS
30.0000 mg | ORAL_TABLET | ORAL | Status: DC | PRN
  Administered 2013-01-12 – 2013-01-13 (×4): 30 mg via ORAL
  Filled 2013-01-11 (×5): qty 2

## 2013-01-11 MED ORDER — BISACODYL 10 MG RE SUPP
10.0000 mg | Freq: Once | RECTAL | Status: AC
  Administered 2013-01-11: 10 mg via RECTAL
  Filled 2013-01-11: qty 1

## 2013-01-11 MED ORDER — PANTOPRAZOLE SODIUM 40 MG PO TBEC
40.0000 mg | DELAYED_RELEASE_TABLET | Freq: Every day | ORAL | Status: DC
  Administered 2013-01-11 – 2013-01-13 (×3): 40 mg via ORAL
  Filled 2013-01-11 (×3): qty 1

## 2013-01-11 NOTE — Progress Notes (Signed)
Pt's BP is 120/110, pulse 120's, abd soft, heart murmur noted.  No c/o calf tenderness, SCDs on.  O2 sat=92-93% on room air.  Dr. Andrey Campanile called and notified.  Order received to go ahead and give the noon Lopressor and increase the catapres patch dosage.

## 2013-01-11 NOTE — Progress Notes (Signed)
4 Days Post-Op ex lap, SBR Subjective: Pain control better, still hypertensive but stable, no flatus, tolerated full liquids  Objective: Vital signs in last 24 hours: Temp:  [98.2 F (36.8 C)-98.8 F (37.1 C)] 98.8 F (37.1 C) (06/01 0622) Pulse Rate:  [81-100] 81 (06/01 0622) Resp:  [16-25] 16 (06/01 0622) BP: (140-179)/(90-115) 140/90 mmHg (06/01 0622) SpO2:  [92 %-100 %] 92 % (06/01 0622) FiO2 (%):  [31 %] 31 % (06/01 0000)   Intake/Output from previous day: 05/31 0701 - 06/01 0700 In: 240 [P.O.:240] Out: -  Intake/Output this shift:   General appearance: alert, cooperative and fatigued GI: soft, appropriately tender  Incision: healing well, slight drainage at base of incision   Lab Results:   Recent Labs  01/09/13 0650 01/10/13 0550  WBC 13.1* 11.3*  HGB 15.0 15.2*  HCT 43.9 43.7  PLT 283 297   BMET  Recent Labs  01/09/13 0650 01/10/13 0550  NA 130* 136  K 3.7 3.3*  CL 97 101  CO2 24 24  GLUCOSE 131* 125*  BUN 5* 6  CREATININE 0.43* 0.45*  CALCIUM 11.0* 11.4*   PT/INR No results found for this basename: LABPROT, INR,  in the last 72 hours ABG No results found for this basename: PHART, PCO2, PO2, HCO3,  in the last 72 hours  MEDS, Scheduled . cloNIDine  0.2 mg Transdermal Weekly  . cyclobenzaprine  30 mg Oral QHS  . docusate sodium  100 mg Oral TID  . enoxaparin (LOVENOX) injection  40 mg Subcutaneous Q24H  . lisinopril  20 mg Oral Daily  . metoprolol  10 mg Intravenous Q6H  . morphine  30 mg Oral Q12H  . morphine   Intravenous Q4H  . pantoprazole (PROTONIX) IV  40 mg Intravenous QHS  . polyethylene glycol  17 g Oral BID    Studies/Results: No results found.  Assessment: s/p Procedure(s): EXPLORATORY LAPAROTOMY,  LYSIS OF ADHESIONS , SMALL BOWEL RESECTION Patient Active Problem List   Diagnosis Date Noted  . Hypercalcemia 10/13/2012  . Nausea and vomiting 10/10/2012  . Infected hematoma following procedure 05/15/2012  .  Constipation   . IBS (irritable bowel syndrome)   . 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 Fatigue 12/21/2011  . Abdominal pain 12/06/2011  . Open abdominal wall wound 12/06/2011  . Preop cardiovascular exam 11/16/2011  . Hypertension   . Primary hyperparathyroidism 11/01/2011  . Nausea 10/19/2011  . MIGRAINES, HX OF 07/22/2009  . PARESTHESIA 07/14/2008  . ALLERGIC RHINITIS 05/19/2008  . FATIGUE 06/18/2007  . HYPERLIPIDEMIA 06/17/2007  . METABOLIC SYNDROME X 06/17/2007  . OBESITY 06/17/2007  . ANEMIA-NOS 06/17/2007  . TOBACCO ABUSE 06/17/2007  . DEPRESSION 06/17/2007  . 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 HYPERTENSION 06/17/2007  . GERD 06/17/2007  . GASTROPARESIS 06/17/2007  . HERNIATED CERVICAL DISC 06/17/2007  . INSOMNIA 06/17/2007    Expected post op course, significant pain due to surgery and narcotic dependence  Plan: Will cont Fulls Will restart PO Morphine home dose Cont PCA for breakthrough pain Ambulate Cont miralax BID, colace TID, suppository today   LOS: 5 days     .Vanita Panda, MD [facility] Surgery, [place] [phone]   01/11/2013 7:55 AM

## 2013-01-11 NOTE — Progress Notes (Signed)
BP is 161/105, pulse 115.  Sats 96% on 2 L O2.  Pt slightly SOB upon exertion.  Pt had BM today and passing gas.  Encouraged to be up OOB and ambulating in the hall.  Call placed to Dr. Andrey Campanile to see if can change Lopressor to PO since pt on FL diet now to see if can get better control.

## 2013-01-12 ENCOUNTER — Encounter (INDEPENDENT_AMBULATORY_CARE_PROVIDER_SITE_OTHER): Payer: Medicare Other | Admitting: General Surgery

## 2013-01-12 MED ORDER — MORPHINE SULFATE ER 30 MG PO TBCR
30.0000 mg | EXTENDED_RELEASE_TABLET | ORAL | Status: AC
  Administered 2013-01-12: 30 mg via ORAL

## 2013-01-12 MED ORDER — RAMELTEON 8 MG PO TABS
8.0000 mg | ORAL_TABLET | Freq: Every day | ORAL | Status: DC
  Administered 2013-01-12: 8 mg via ORAL
  Filled 2013-01-12 (×2): qty 1

## 2013-01-12 MED ORDER — MORPHINE SULFATE ER 30 MG PO TBCR
60.0000 mg | EXTENDED_RELEASE_TABLET | Freq: Two times a day (BID) | ORAL | Status: DC
  Administered 2013-01-12: 60 mg via ORAL
  Filled 2013-01-12: qty 2
  Filled 2013-01-12: qty 1

## 2013-01-12 MED ORDER — BACID PO TABS
2.0000 | ORAL_TABLET | Freq: Three times a day (TID) | ORAL | Status: DC
  Administered 2013-01-12 (×2): 2 via ORAL
  Filled 2013-01-12 (×7): qty 2

## 2013-01-12 NOTE — Progress Notes (Signed)
5 Days Post-Op ex lap, SBR Subjective: Having flatus and BM.  No nausea. Pain controlled  Objective: Vital signs in last 24 hours: Temp:  [98.3 F (36.8 C)-99.1 F (37.3 C)] 98.8 F (37.1 C) (06/02 0600) Pulse Rate:  [75-120] 75 (06/02 0600) Resp:  [16-22] 18 (06/02 0833) BP: (148-200)/(92-111) 155/94 mmHg (06/02 0600) SpO2:  [91 %-100 %] 96 % (06/02 0833)   Intake/Output from previous day: 06/01 0701 - 06/02 0700 In: 1346 [P.O.:960; I.V.:386] Out: 1 [Stool:1] Intake/Output this shift:   General appearance: alert, cooperative and fatigued GI: soft, appropriately tender  Incision: healing well, no signs of infection  Lab Results:   Recent Labs  01/10/13 0550  WBC 11.3*  HGB 15.2*  HCT 43.7  PLT 297   BMET  Recent Labs  01/10/13 0550  NA 136  K 3.3*  CL 101  CO2 24  GLUCOSE 125*  BUN 6  CREATININE 0.45*  CALCIUM 11.4*   PT/INR No results found for this basename: LABPROT, INR,  in the last 72 hours ABG No results found for this basename: PHART, PCO2, PO2, HCO3,  in the last 72 hours  MEDS, Scheduled . cloNIDine  0.3 mg Transdermal Weekly  . cyclobenzaprine  30 mg Oral QHS  . docusate sodium  100 mg Oral TID  . enoxaparin (LOVENOX) injection  40 mg Subcutaneous Q24H  . lisinopril  20 mg Oral Daily  . metoprolol tartrate  50 mg Oral BID  . morphine  30 mg Oral Q12H  . morphine   Intravenous Q4H  . pantoprazole  40 mg Oral Q1200  . polyethylene glycol  17 g Oral BID    Studies/Results: No results found.  Assessment: s/p Procedure(s): EXPLORATORY LAPAROTOMY,  LYSIS OF ADHESIONS , SMALL BOWEL RESECTION Patient Active Problem List   Diagnosis Date Noted  . Hypercalcemia 10/13/2012  . Nausea and vomiting 10/10/2012  . Infected hematoma following procedure 05/15/2012  . Constipation   . IBS (irritable bowel syndrome)   . 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 Fatigue 12/21/2011  . Abdominal pain 12/06/2011  . Open abdominal wall wound 12/06/2011  .  Preop cardiovascular exam 11/16/2011  . Hypertension   . Primary hyperparathyroidism 11/01/2011  . Nausea 10/19/2011  . MIGRAINES, HX OF 07/22/2009  . PARESTHESIA 07/14/2008  . ALLERGIC RHINITIS 05/19/2008  . FATIGUE 06/18/2007  . HYPERLIPIDEMIA 06/17/2007  . METABOLIC SYNDROME X 06/17/2007  . OBESITY 06/17/2007  . ANEMIA-NOS 06/17/2007  . TOBACCO ABUSE 06/17/2007  . DEPRESSION 06/17/2007  . 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 HYPERTENSION 06/17/2007  . GERD 06/17/2007  . GASTROPARESIS 06/17/2007  . HERNIATED CERVICAL DISC 06/17/2007  . INSOMNIA 06/17/2007    Expected post op course, significant pain due to surgery and narcotic dependence  Plan: Will advance to reg diet Will restart PO home meds D/C PCA  Ambulate Cont miralax BID, colace TID  LOS: 6 days     .Vanita Panda, MD [facility] Surgery, [place] [phone]   01/12/2013 8:37 AM

## 2013-01-13 ENCOUNTER — Encounter (INDEPENDENT_AMBULATORY_CARE_PROVIDER_SITE_OTHER): Payer: Self-pay

## 2013-01-13 MED ORDER — POLYETHYLENE GLYCOL 3350 17 G PO PACK: 17.0000 g | PACK | Freq: Two times a day (BID) | ORAL | Status: DC

## 2013-01-13 NOTE — Discharge Summary (Signed)
Physician Discharge Summary  Patient ID: Emma Munoz MRN: [id number] DOB/AGE: [id number] y.o.  Admit date: 01/06/2013 Discharge date: 01/13/2013  Admission Diagnoses: small bowel obstruction and stricture  Discharge Diagnoses: small bowel stricture  Discharged Condition: good  Hospital Course: The patient was admitted emergently for SBO.  She had a small bowel stricture from her previous surgery.  She underwent ex lap and SBR the following day.  She underwent an uncomplicated recovery in the hospital.  She was discharged to home on POD 6 in stable condition.    Consults: None  Significant Diagnostic Studies: labs: cbc, chemistries  Treatments: IV hydration, analgesia: Morphine, cardiac meds: lisinopril (generic), metoprolol and hydralazine and surgery: ex lap, LOA, SBR  Discharge Exam: Blood pressure 165/117, pulse 93, temperature 98.6 F (37 C), temperature source Oral, resp. rate 18, height 5\' 7"  (1.702 m), weight 212 lb 11.2 oz (96.48 kg), SpO2 97.00%. General appearance: alert and cooperative GI: normal findings: soft, non-tender Incision/Wound: clean, dry, intact  Disposition: 01-Home or Self Care      Discharge Orders   Future Orders Complete By Expires     Discharge patient  As directed         Medication List    TAKE these medications       acetaminophen 500 MG tablet  Commonly known as:  TYLENOL  Take 500 mg by mouth every 6 (six) hours as needed for pain.     Acidophilus 100 MG Caps  Take 100 mg by mouth daily.     cyclobenzaprine 10 MG tablet  Commonly known as:  FLEXERIL  Take 20 mg by mouth at bedtime.     docusate sodium 100 MG capsule  Commonly known as:  COLACE  Take 200 mg by mouth 2 (two) times daily.     esomeprazole 40 MG capsule  Commonly known as:  NEXIUM  Take 40 mg by mouth daily before breakfast.     lisinopril 20 MG tablet  Commonly known as:  PRINIVIL,ZESTRIL  Take 20 mg by mouth daily.     metoprolol 50 MG tablet   Commonly known as:  LOPRESSOR  Take 50 mg by mouth 2 (two) times daily.     morphine 60 MG 24 hr capsule  Commonly known as:  KADIAN  Take 60 mg by mouth 2 (two) times daily.     morphine 30 MG tablet  Commonly known as:  MSIR  Take 30 mg by mouth 3 (three) times daily as needed for pain.     polyethylene glycol packet  Commonly known as:  MIRALAX / GLYCOLAX  Take 17 g by mouth 2 (two) times daily.     PROTEGRA PO  Take 1 tablet by mouth daily.     pseudoephedrine 120 MG 12 hr tablet  Commonly known as:  SUDAFED  Take 120 mg by mouth 2 (two) times daily as needed for congestion.     ramelteon 8 MG tablet  Commonly known as:  ROZEREM  Take 8 mg by mouth at bedtime.     Vitamin D (Ergocalciferol) 50000 UNITS Caps  Commonly known as:  DRISDOL  Take 50,000 Units by mouth 2 (two) times a week.       Follow-up Information   Follow up with Vanita Panda., MD. Schedule an appointment as soon as possible for a visit in 2 weeks.   Contact information:   [address], [address] [place] [address] [phone]       Signed:  Kentavius Dettore C. 01/13/2013, 7:41 AM

## 2013-01-14 ENCOUNTER — Ambulatory Visit (INDEPENDENT_AMBULATORY_CARE_PROVIDER_SITE_OTHER): Payer: Medicare Other | Admitting: Surgery

## 2013-01-14 ENCOUNTER — Encounter (INDEPENDENT_AMBULATORY_CARE_PROVIDER_SITE_OTHER): Payer: Self-pay | Admitting: Surgery

## 2013-01-14 VITALS — BP 132/78 | HR 96 | Temp 97.1°F | Resp 18 | Ht 67.0 in | Wt 209.6 lb

## 2013-01-14 DIAGNOSIS — Z09 Encounter for follow-up examination after completed treatment for conditions other than malignant neoplasm: Secondary | ICD-10-CM

## 2013-01-14 NOTE — Progress Notes (Signed)
Subjective:     Patient ID: Emma Munoz, female   DOB: 1966-07-26, 47 y.o.   MRN: [id number]  HPI She is here for a wound check. She was just discharged yesterday by Dr. Maisie Fus. She is 7 days status post exploratory laparotomy for a small bowel obstruction. She started having drainage from her lower part of the incision today. She is otherwise doing well. She is eating well moving her bowels well  Review of Systems     Objective:   Physical Exam On exam, there is a small open area in the lower midline of the incision. I probed with a Q-tip. This is consistent with a seroma. There is no erythema or purulence    Assessment:     Patient stable postop     Plan:     Wound care instructions were given. She will go ahead and make an appointment to see Dr. Maisie Fus back next week or so. She will come back sooner if she develops any erythema

## 2013-01-19 ENCOUNTER — Telehealth (INDEPENDENT_AMBULATORY_CARE_PROVIDER_SITE_OTHER): Payer: Self-pay | Admitting: General Surgery

## 2013-01-19 NOTE — Telephone Encounter (Signed)
Patient wants to know if Dr Maisie Fus called the pathologist about her last results and she was told by Dr Maisie Fus that she will get back to her. Patient is waiting a call back. [phone]

## 2013-01-20 ENCOUNTER — Telehealth (INDEPENDENT_AMBULATORY_CARE_PROVIDER_SITE_OTHER): Payer: Self-pay | Admitting: General Surgery

## 2013-01-20 NOTE — Telephone Encounter (Signed)
Tried to call but phone number "not accepting calls".  Please let her know the pathologist didn't have anything new to say.  It does not look like Crohn's or IBD, according to both pathologists that reviewed the case.

## 2013-01-20 NOTE — Telephone Encounter (Signed)
Tried multi times and patient cell phone is off. Need to talk to patient Emma Munoz

## 2013-01-21 NOTE — Telephone Encounter (Signed)
I attempted to call pt and her phone still states it is not accepting calls.  I tried both of her husband's phone numbers and neither work.  We need to tell her if she calls back that Dr Maisie Fus said both pathologists reviewed her path and there is nothing new to say.  It does not look like Crohn's or IBD.

## 2013-01-29 ENCOUNTER — Encounter (INDEPENDENT_AMBULATORY_CARE_PROVIDER_SITE_OTHER): Payer: Self-pay | Admitting: General Surgery

## 2013-01-29 ENCOUNTER — Ambulatory Visit (INDEPENDENT_AMBULATORY_CARE_PROVIDER_SITE_OTHER): Payer: Medicare Other | Admitting: General Surgery

## 2013-01-29 VITALS — BP 130/80 | HR 70 | Temp 97.4°F | Resp 16 | Ht 67.0 in | Wt 195.6 lb

## 2013-01-29 DIAGNOSIS — Z9889 Other specified postprocedural states: Secondary | ICD-10-CM

## 2013-01-29 DIAGNOSIS — R609 Edema, unspecified: Secondary | ICD-10-CM

## 2013-01-29 DIAGNOSIS — R600 Localized edema: Secondary | ICD-10-CM

## 2013-01-29 NOTE — Patient Instructions (Signed)
Stop senokot.  We will order an ultrasound of your left leg to rule out a blood clot.

## 2013-01-29 NOTE — Progress Notes (Signed)
Emma Munoz is a 47 y.o. female who is status post a ex lap and sbr on 5/28.  She still c/o  abd pain but her bloating is better.  She is having diarrhea with the stool softener and senokot.  She c/o L leg swelling as well to above the knee.  She has been taking extra diuretics to help with this.    Objective: Filed Vitals:   01/29/13 1628  BP: 130/80  Pulse: 70  Temp: 97.4 F (36.3 C)  Resp: 16    General appearance: alert and cooperative GI: soft, non-tender; bowel sounds normal; no masses,  no organomegaly  Incision: healing well, no significant drainage, no hernias palpated    Assessment: s/p  Patient Active Problem List   Diagnosis Date Noted  . Hypercalcemia 10/13/2012  . Nausea and vomiting 10/10/2012  . Infected hematoma following procedure 05/15/2012  . Constipation   . IBS (irritable bowel syndrome)   . 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 Fatigue 12/21/2011  . Abdominal pain 12/06/2011  . Open abdominal wall wound 12/06/2011  . Preop cardiovascular exam 11/16/2011  . Hypertension   . Primary hyperparathyroidism 11/01/2011  . Nausea 10/19/2011  . MIGRAINES, HX OF 07/22/2009  . PARESTHESIA 07/14/2008  . ALLERGIC RHINITIS 05/19/2008  . FATIGUE 06/18/2007  . HYPERLIPIDEMIA 06/17/2007  . METABOLIC SYNDROME X 06/17/2007  . OBESITY 06/17/2007  . ANEMIA-NOS 06/17/2007  . TOBACCO ABUSE 06/17/2007  . DEPRESSION 06/17/2007  . 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 HYPERTENSION 06/17/2007  . GERD 06/17/2007  . GASTROPARESIS 06/17/2007  . HERNIATED CERVICAL DISC 06/17/2007  . INSOMNIA 06/17/2007    Plan: Unilateral leg swelling: will get vasc duplex to r/o dvt.  Pt will f/u with PCP as well in Aug. Abd pain: unknown etiology, pt has been diagnosed with IBS Recurrent SB strictures: unknown etiology, pathologist does not feel that this is due to IBD, more c/w FB reaction I will see her back in 4-6 weeks    .Vanita Panda, MD [facility],  [place] [phone]   01/29/2013 5:19 PM

## 2013-01-30 ENCOUNTER — Telehealth (INDEPENDENT_AMBULATORY_CARE_PROVIDER_SITE_OTHER): Payer: Self-pay

## 2013-01-30 ENCOUNTER — Ambulatory Visit (HOSPITAL_COMMUNITY): Admission: RE | Admit: 2013-01-30 | Payer: Medicare Other | Source: Ambulatory Visit

## 2013-01-30 NOTE — Telephone Encounter (Signed)
I have arranged for the patient to go to [facility] this morning for a doppler.  She needs to arrive at 10am in admitting.  She will be added on and have a little bit of a wait.  She has to go in entrance A.

## 2013-01-30 NOTE — Telephone Encounter (Signed)
I called the patient back and gave her the instructions.  She has to arrive at 10am for a 10:30 appointment.

## 2013-02-02 ENCOUNTER — Telehealth (INDEPENDENT_AMBULATORY_CARE_PROVIDER_SITE_OTHER): Payer: Self-pay

## 2013-02-02 ENCOUNTER — Ambulatory Visit (HOSPITAL_COMMUNITY)
Admission: RE | Admit: 2013-02-02 | Discharge: 2013-02-02 | Disposition: A | Discharge status: Home / Self Care | Payer: Medicare Other | Source: Ambulatory Visit | Attending: General Surgery | Admitting: General Surgery

## 2013-02-02 DIAGNOSIS — M7989 Other specified soft tissue disorders: Secondary | ICD-10-CM

## 2013-02-02 DIAGNOSIS — R600 Localized edema: Secondary | ICD-10-CM

## 2013-02-02 DIAGNOSIS — Z9889 Other specified postprocedural states: Secondary | ICD-10-CM | POA: Insufficient documentation

## 2013-02-02 NOTE — Telephone Encounter (Signed)
Thank you.  Maralyn Sago, can you please make sure patient is aware of her results?   AT

## 2013-02-02 NOTE — Telephone Encounter (Signed)
Left leg Doppler is negative Per doppler study. Patient Discharged home

## 2013-02-02 NOTE — Telephone Encounter (Signed)
Patient is aware and has her follow up for 7/17.

## 2013-02-02 NOTE — Progress Notes (Signed)
*  PRELIMINARY RESULTS* Vascular Ultrasound Left lower extremity venous duplex has been completed.  Preliminary findings: negative for DVT.  Called report to [place].    Farrel Demark, RDMS, RVT  02/02/2013, 11:23 AM

## 2013-02-06 ENCOUNTER — Other Ambulatory Visit: Payer: Self-pay | Admitting: *Deleted

## 2013-02-09 ENCOUNTER — Other Ambulatory Visit: Payer: Self-pay | Admitting: Gastroenterology

## 2013-02-09 MED ORDER — OMEPRAZOLE 40 MG PO CPDR: 40.0000 mg | DELAYED_RELEASE_CAPSULE | Freq: Every day | ORAL | Status: DC

## 2013-02-10 ENCOUNTER — Other Ambulatory Visit: Payer: Self-pay | Admitting: *Deleted

## 2013-02-10 ENCOUNTER — Telehealth (INDEPENDENT_AMBULATORY_CARE_PROVIDER_SITE_OTHER): Payer: Self-pay | Admitting: General Surgery

## 2013-02-10 NOTE — Telephone Encounter (Signed)
She has not been seen in over a year. No refills until seen. I think she has established care with another physician.

## 2013-02-10 NOTE — Telephone Encounter (Signed)
Patient calling today stating she has not had a BM since 01/28/2013. She is still taking two senokot and two colace a day. She is drinking plenty of water and is passing gas. Please advise. [phone].

## 2013-02-10 NOTE — Telephone Encounter (Signed)
Patient has not been seen in over an year, requesting refills on medication

## 2013-02-10 NOTE — Telephone Encounter (Signed)
Discussed constipation with patient.  I told her to start Miralax BID and colace TID.  She should continue enemas 1-2 times a day until BM.  If not effect after 48hr, should take milk of mag or mag citrate.

## 2013-02-12 ENCOUNTER — Other Ambulatory Visit: Payer: Self-pay | Admitting: *Deleted

## 2013-02-26 ENCOUNTER — Ambulatory Visit (INDEPENDENT_AMBULATORY_CARE_PROVIDER_SITE_OTHER): Payer: Medicare Other | Admitting: General Surgery

## 2013-02-26 ENCOUNTER — Encounter (INDEPENDENT_AMBULATORY_CARE_PROVIDER_SITE_OTHER): Payer: Self-pay | Admitting: General Surgery

## 2013-02-26 VITALS — BP 144/88 | HR 78 | Temp 97.3°F | Resp 14 | Ht 67.0 in | Wt 194.4 lb

## 2013-02-26 DIAGNOSIS — Z9889 Other specified postprocedural states: Secondary | ICD-10-CM

## 2013-02-26 NOTE — Progress Notes (Signed)
Emma Munoz is a 47 y.o. female who is status post a ex lap and sbr on 5/28.  She was complaining of unilateral leg swelling last visit, but a vascular duplex was neg for dvt.  She is having trouble with constipation and abd pain, but this is back to her baseline.  She has tried several different stool softeners and laxatives.  She is currently on a low fiber diet after surgery.   Objective: Filed Vitals:   02/26/13 1108  BP: 144/88  Pulse: 78  Temp: 97.3 F (36.3 C)  Resp: 14    General appearance: alert and cooperative GI: soft, non-tender; bowel sounds normal; no masses,  no organomegaly Inc: healing well  Assessment and Plan: Back to her baseline after surgery.  I have asked her to slowly increase her fiber intake to help with constipation.  I will see her back as needed.    Vanita Panda, MD [facility], [place] [phone]

## 2013-02-26 NOTE — Patient Instructions (Signed)
Follow up as needed.   GETTING TO GOOD BOWEL HEALTH. Irregular bowel habits such as constipation can lead to many problems over time.  Having one soft bowel movement a day is the most important way to prevent further problems.  The anorectal canal is designed to handle stretching and feces to safely manage our ability to get rid of solid waste (feces, poop, stool) out of our body.  BUT, hard constipated stools can act like ripping concrete bricks causing inflamed hemorrhoids, anal fissures, abdominal pain and bloating.     The goal: ONE SOFT BOWEL MOVEMENT A DAY!  To have soft, regular bowel movements:    Drink at least 8 tall glasses of water a day.     Take plenty of fiber.  Fiber is the undigested part of plant food that passes into the colon, acting s "natures broom" to encourage bowel motility and movement.  Fiber can absorb and hold large amounts of water. This results in a larger, bulkier stool, which is soft and easier to pass. Work gradually over several weeks up to 6 servings a day of fiber (25g a day even more if needed) in the form of: o Vegetables -- Root (potatoes, carrots, turnips), leafy green (lettuce, salad greens, celery, spinach), or cooked high residue (cabbage, broccoli, etc) o Fruit -- Fresh (unpeeled skin & pulp), Dried (prunes, apricots, cherries, etc ),  or stewed ( applesauce)  o Whole grain breads, pasta, etc (whole wheat)  o Bran cereals    Bulking Agents -- This type of water-retaining fiber generally is easily obtained each day by one of the following:  o Psyllium bran -- The psyllium plant is remarkable because its ground seeds can retain so much water. This product is available as Metamucil, Konsyl, Effersyllium, Per Diem Fiber, or the less expensive generic preparation in drug and health food stores. Although labeled a laxative, it really is not a laxative.  o Methylcellulose -- This is another fiber derived from wood which also retains water. It is available as  Citrucel. o Polyethylene Glycol - and "artificial" fiber commonly called Miralax or Glycolax.  It is helpful for people with gassy or bloated feelings with regular fiber o Flax Seed - a less gassy fiber than psyllium   No reading or other relaxing activity while on the toilet. If bowel movements take longer than 5 minutes, you are too constipated   AVOID CONSTIPATION.  High fiber and water intake usually takes care of this.  Sometimes a laxative is needed to stimulate more frequent bowel movements, but    Laxatives are not a good long-term solution as it can wear the colon out. o Osmotics (Milk of Magnesia, Fleets phosphosoda, Magnesium citrate, MiraLax, GoLytely) are safer than  o Stimulants (Senokot, Castor Oil, Dulcolax, Ex Lax)    o Do not take laxatives for more than 7days in a row.    IF SEVERELY CONSTIPATED, try a Bowel Retraining Program: o Do not use laxatives.  o Eat a diet high in roughage, such as bran cereals and leafy vegetables.  o Drink six (6) ounces of prune or apricot juice each morning.  o Eat two (2) large servings of stewed fruit each day.  o Take one (1) heaping tablespoon of a psyllium-based bulking agent twice a day. Use sugar-free sweetener when possible to avoid excessive calories.  o Eat a normal breakfast.  o Set aside 15 minutes after breakfast to sit on the toilet, but do not strain to have a bowel  movement.  o If you do not have a bowel movement by the third day, use an enema and repeat the above steps.

## 2013-04-15 IMAGING — CR DG ABDOMEN 2V
1 series · 3 of 3 positions shown · non-contrast
Comparison: none

REASON FOR EXAM: Abdominal pain, previous bowel obstruction.
COMMENTS:

PROCEDURE:     DXR - DXR ABDOMEN 2 V FLAT AND ERECT  - February 21, 2012  [DATE]
RESULT:     Comparison: 02/19/2012

[Series 1: w abdomen upright · 0.14mm/px · 3 of 3 slices shown]
[im 1/3]
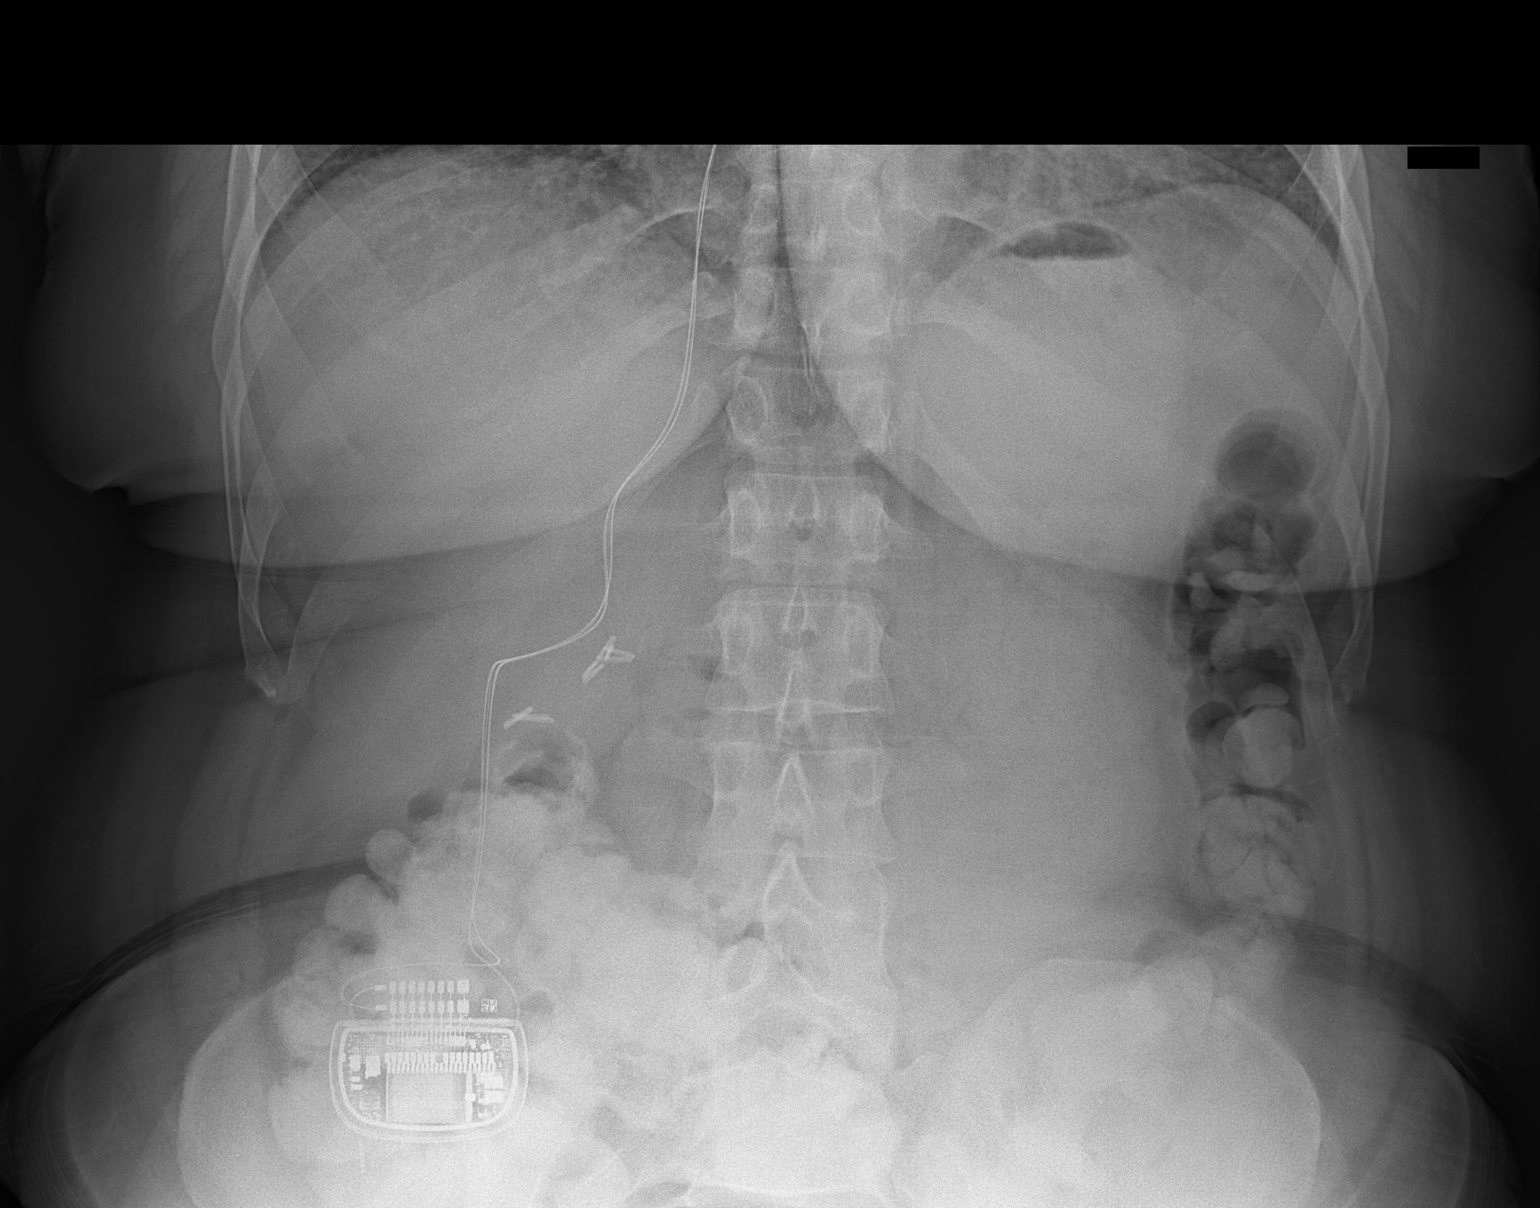
[im 2/3]
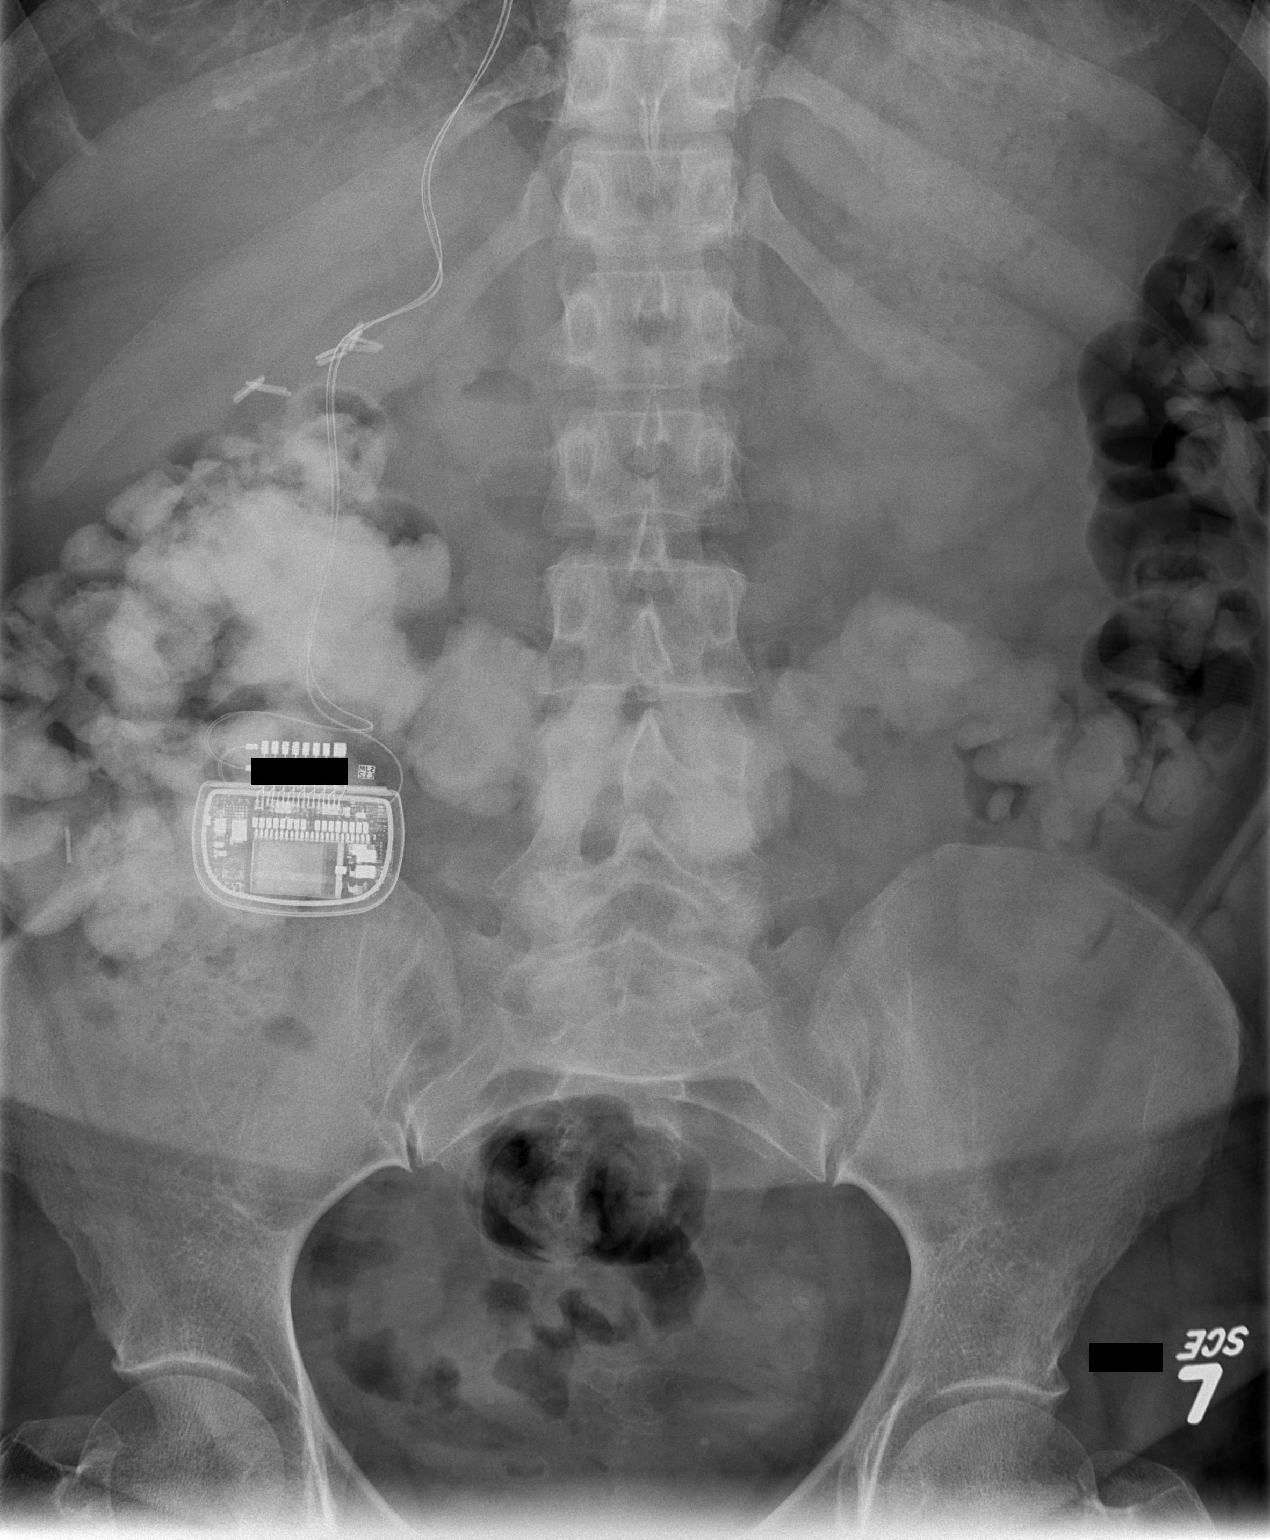
[im 3/3]
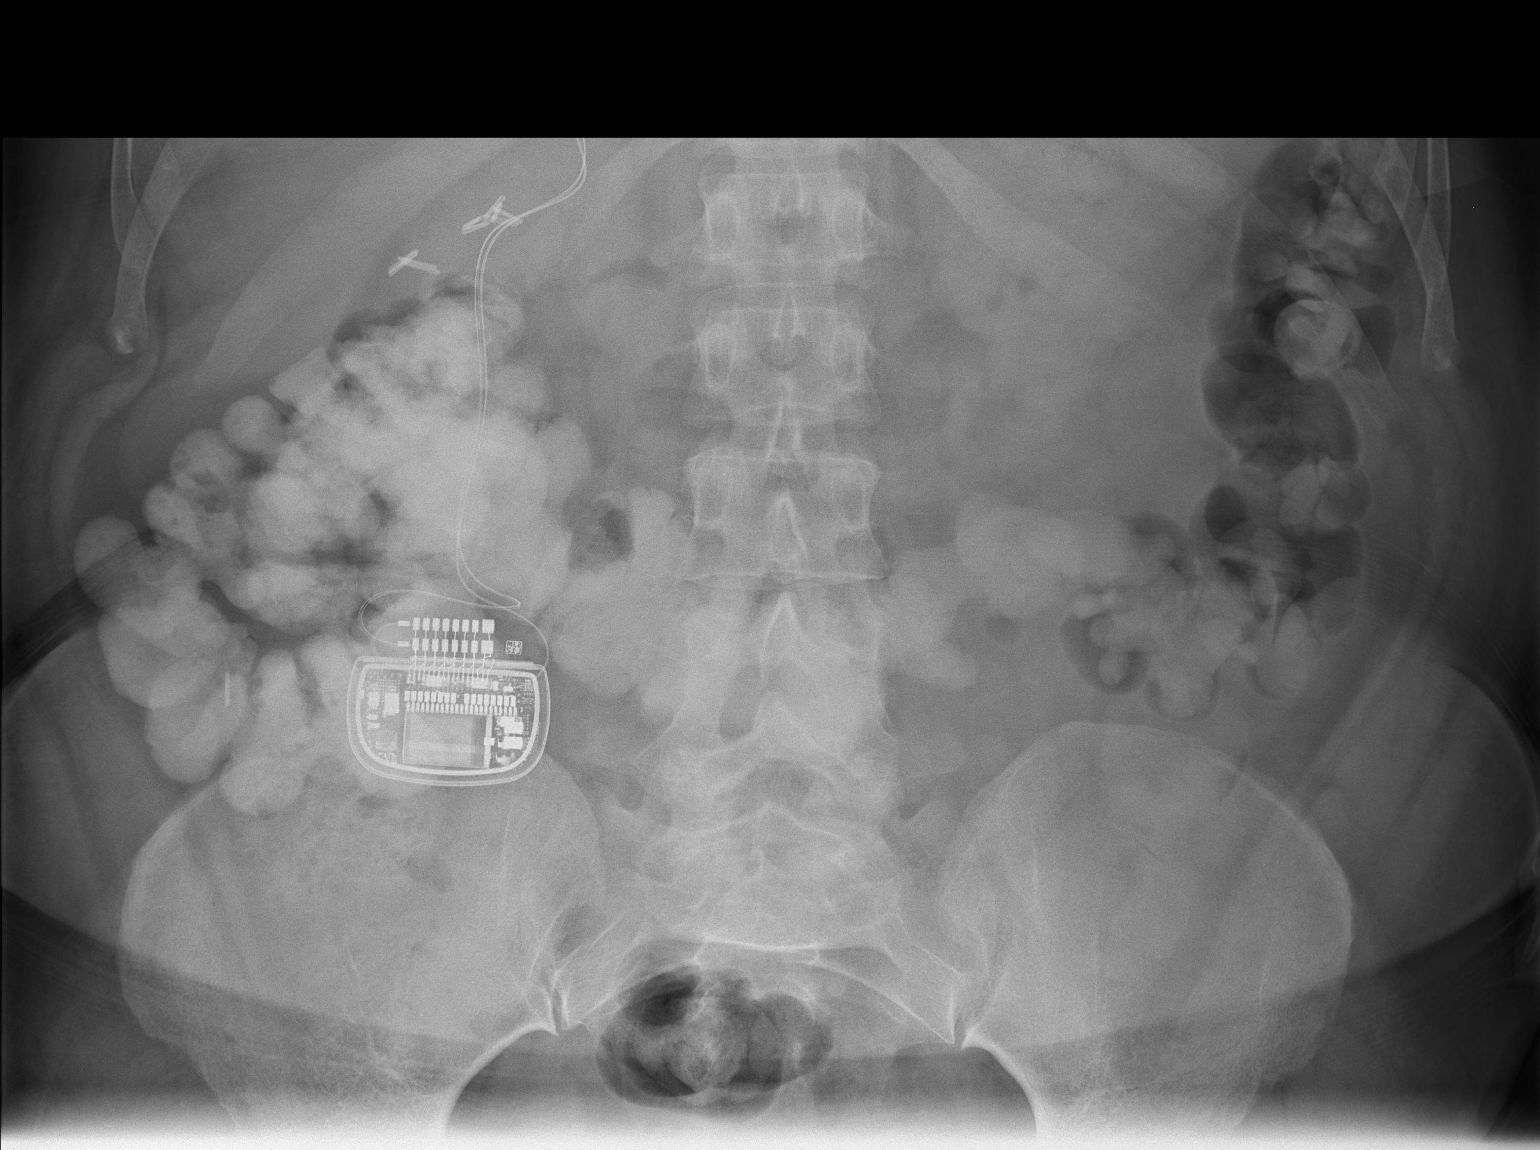

[3 of 3 positions shown; findings below may reference images not displayed]

FINDINGS: No free intraperitoneal air. Air is seen within nondilated small and large
bowel. Oral contrast material is seen within the nondilated ascending and
transverse colon. A generator device and wires overlie the right
hemiabdomen. Surgical clips seen from prior cholecystectomy.
IMPRESSION: Nonobstructed bowel gas pattern.

## 2013-06-18 ENCOUNTER — Other Ambulatory Visit: Payer: Self-pay

## 2013-06-27 IMAGING — CT CT ABD-PELV W/ CM
1 of 4 series · 12 of 32 positions shown, 17 images · IV contrast (OMNIPAQUE 300)
Comparison: 04/06/2012 and earlier.]

CLINICAL DATA: 46-year-old female status post ventral hernia
repair.  Fever and abdominal pain.
Postop day #9.  Laparotomy with small bowel resection for stricture
plus incisional hernia.

CT ABDOMEN AND PELVIS WITH CONTRAST
TECHNIQUE: Multidetector CT imaging of the abdomen and pelvis was
performed following the standard protocol during bolus
administration of intravenous contrast.
Contrast: 100mL OMNIPAQUE IOHEXOL 300 MG/ML  SOLN

[Series 2: abd/pel with · axial · 0.77mm/px · z∈[+846,+1271]mm · 12 of 97 slices shown, 17 images]
[im 6/97  soft-tissue]
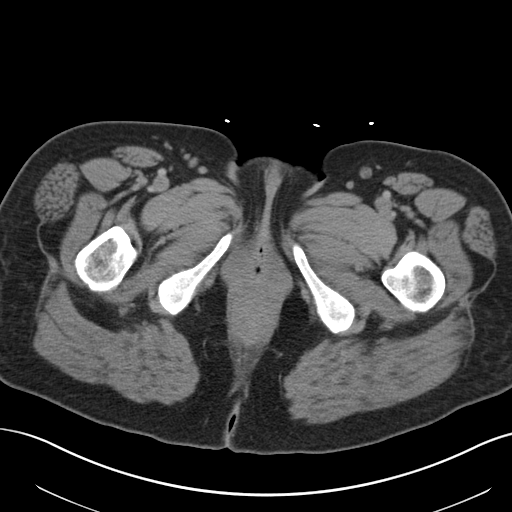
[im 6/97  bone]
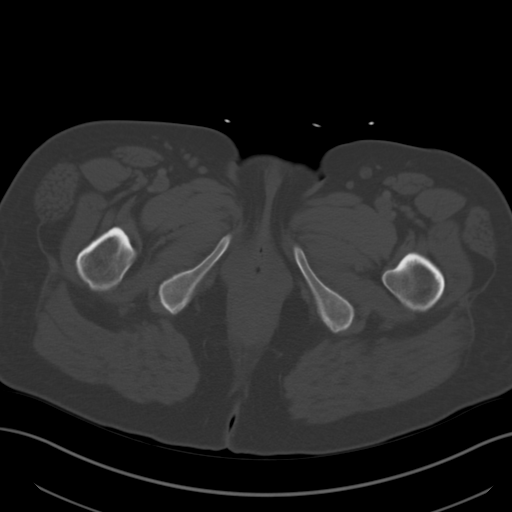
[im 17/97  soft-tissue]
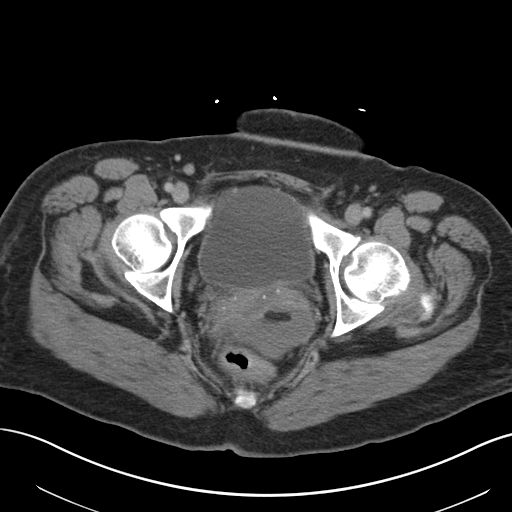
[im 22/97  soft-tissue]
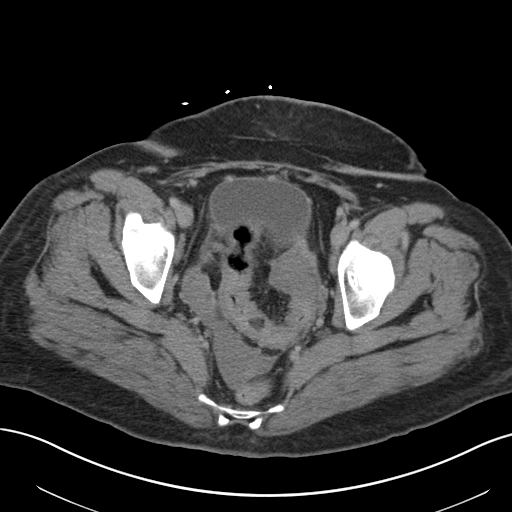
[im 33/97  soft-tissue]
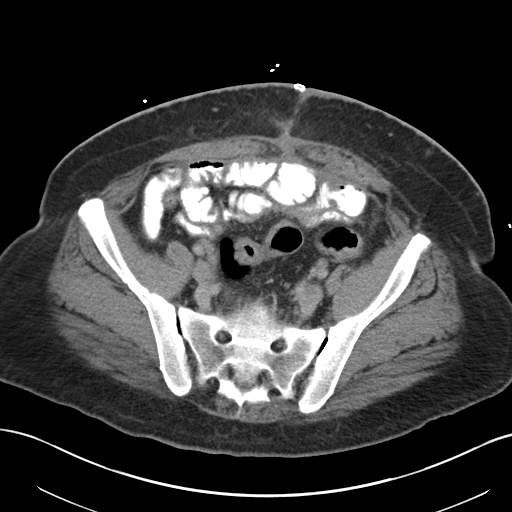
[im 38/97  soft-tissue]
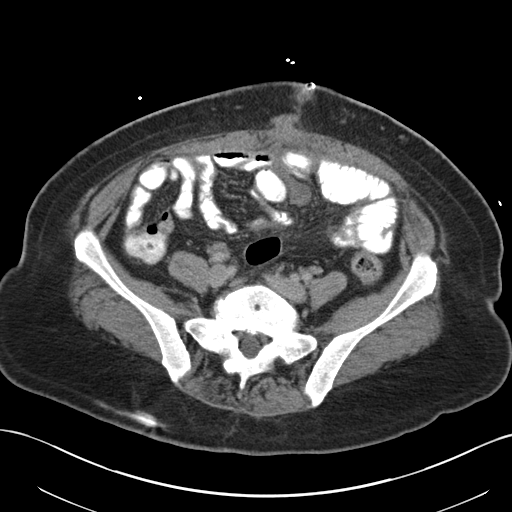
[im 49/97  soft-tissue]
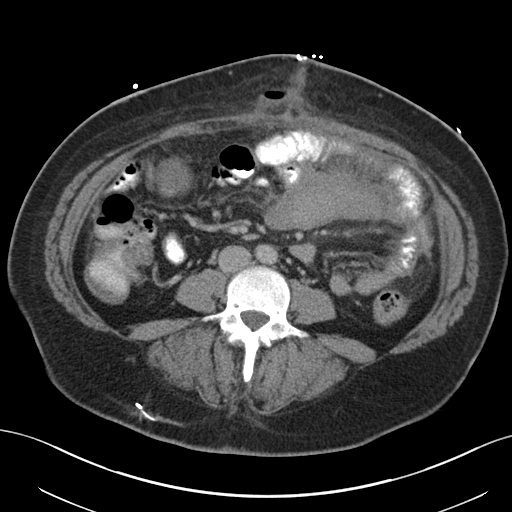
[im 59/97  soft-tissue]
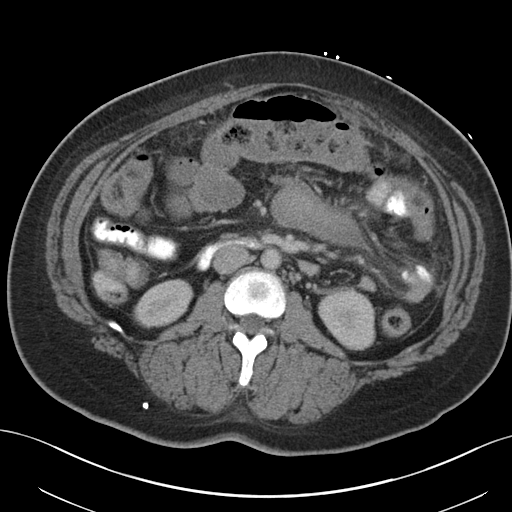
[im 65/97  soft-tissue]
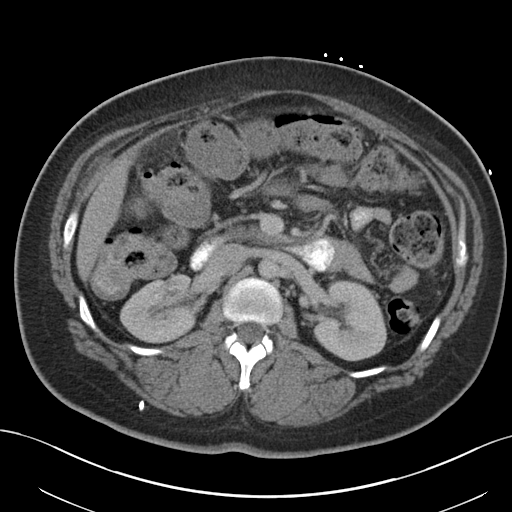
[im 75/97  soft-tissue]
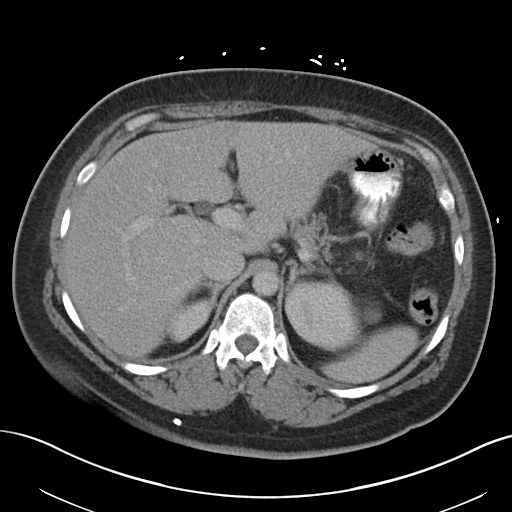
[im 75/97  lung]
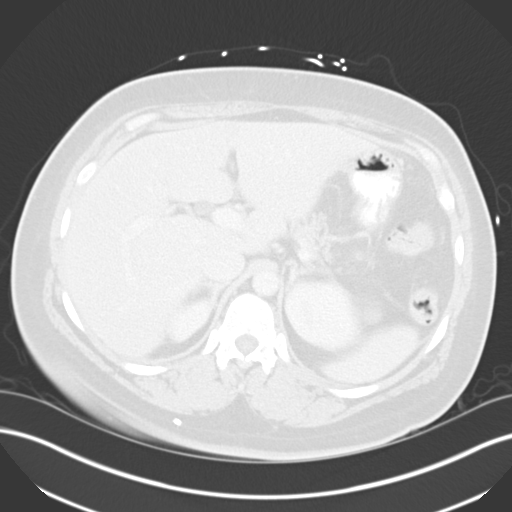
[im 75/97  bone]
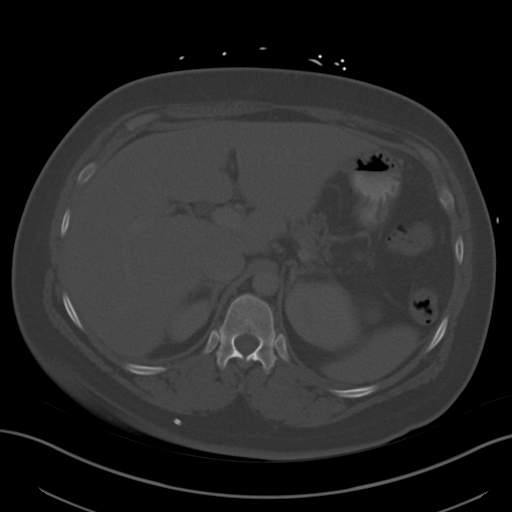
[im 81/97  soft-tissue]
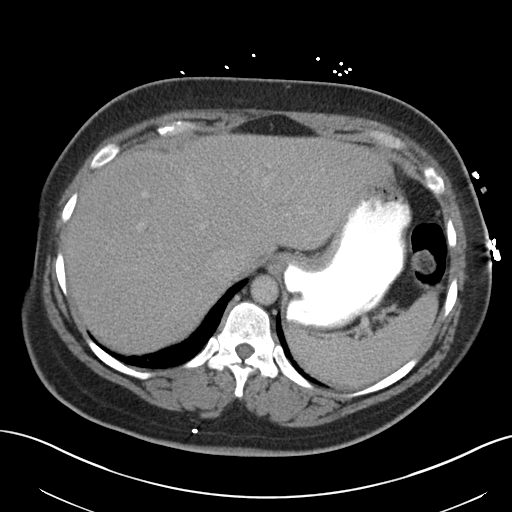
[im 81/97  lung]
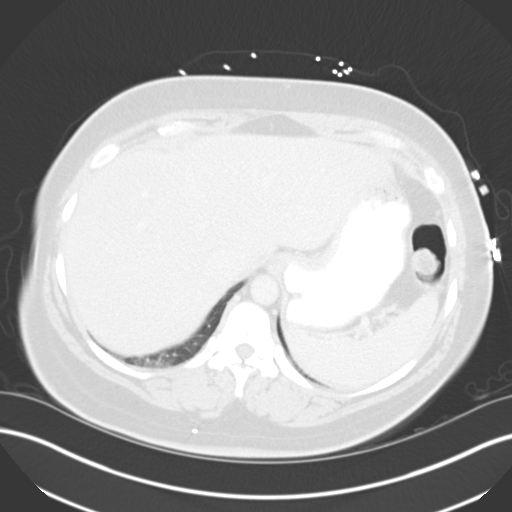
[im 86/97  lung]
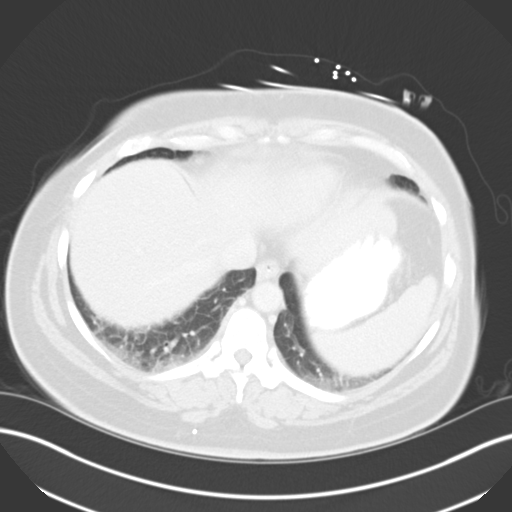
[im 91/97  soft-tissue]
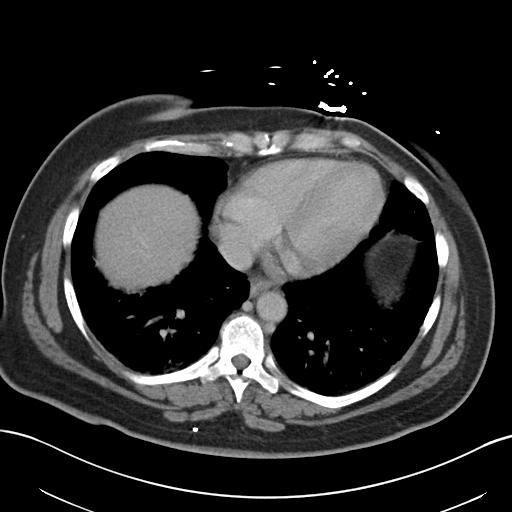
[im 91/97  lung]
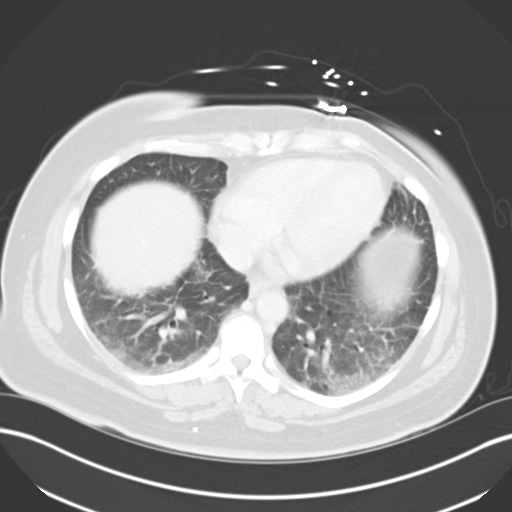

[12 of 32 positions shown; findings below may reference images not displayed]

FINDINGS: Preoperative study 04/06/2012.  No pericardial or pleural
effusion.  There is patchy and confluent dependent pulmonary
opacity.  Less pronounced opacity also in the lingula.  This is
somewhat advanced for atelectasis, but there are no air
bronchograms.

Stable visualized osseous structures.  Right-sided spinal
stimulator device partially visible.  Previous postoperative
changes to the right lower lumbar spine.

There is a small volume of slightly dense/complex fluid in the
pelvis (series 2 image 76, Hounsfield units 23-24).  Uterus appears
be chronically surgically absent but both adnexa are felt to
remain.  The fluid is located anterior to the rectum which appears
within normal limits.  The bladder is fairly unremarkable.  The
sigmoid colon is redundant with mild wall thickening.

The distal descending colon is thick-walled, but by the hepatic
flexure the colon appears within normal limits.

Ventral abdominal postoperative changes are noted with a small gas
fluid collection in the subcutaneous fat measuring 35 x 13 x 36 mm.
This tracks beneath the skin incision.  A somewhat denser
collection along the caudal aspect of the scan incision more
resembles of small postoperative hematoma.  No pneumoperitoneum.

The transverse colon in both flexures are distended mildly with a
mix of fluid and stool.  Oral contrast has reached the cecum and
ascending colon.  The appendix and gallbladder are chronically
surgically absent.

No definite extravasated contrast, but tracking from the root of
the small bowel mesentery through the central mesentery in the left
and central abdomen is an amorphous hyperdense collection (50 HU)
encompassing 4.8 x 9.1 x 14.3 cm (AP by transverse by CC).  Some of
the surrounding small bowel loops are indistinct/inflamed (series 2
image 44) but not abnormally dilated.

No other convincing abdominal fluid or fluid collection.

Decreased density in the liver.  Spleen, pancreas (fatty atrophy),
adrenal glands, stomach and duodenum are within normal limits.
Kidneys are stable and within normal limits.  Main portal venous
system appears to be patent.  Major arterial structures appear
patent.
IMPRESSION: 1.  Appearance most compatible with a moderate sized postoperative
hematoma in the small bowel mesentery as detailed above.  Some
associated inflammation of adjacent small bowel loops, but no
obstruction or extravasated oral contrast (contrast has reached the
cecum).
2.  Small volume of more simple appearing fluid (may be evolved
blood or seroma fluid) in the pelvis.
3.  Small volume mixed of postoperative blood and air fluid
collection in the subcutaneous abdomen underlying the incision
site.  The air fluid collection might represent a developing
abscess but does not appear organized and currently measures 35 x
13 x 36 mm.
4.  Dependent pulmonary opacity may reflect atelectasis but
developing lower lobe infection is difficult to exclude.

## 2013-12-02 NOTE — Telephone Encounter (Signed)
Patient was seen on 11/16/11

## 2014-03-08 ENCOUNTER — Telehealth (INDEPENDENT_AMBULATORY_CARE_PROVIDER_SITE_OTHER): Payer: Self-pay | Admitting: *Deleted

## 2014-03-08 NOTE — Telephone Encounter (Signed)
Pt called in.  She is s/p laparatomy in 2014.  Last office visit was 02-2013 with Dr. Marcello Moores.  Pt advises that since Saturday night, 03-06-14, she spend 6 hours throwing up, abdomen tender, cramping, and her intestines are hurting.  She states that drinking water is painful to her.  She advised that she has only been able to hold down clear liquids since Saturday night and she feels that her abdomen is swollen.  Pt wanted to make an appt with Dr. Marcello Moores.  I called back and talked with Ocie Bob, MA, Dr. Manon Hilding assistant, and she advised that with the pt's history and symptoms, that pt needed to go to the ER.  I advised pt of the information to go to the ER because we can't do anything in the office with her symptoms and her history of exp lap.  Pt verbalized understanding.  Emma Munoz

## 2014-04-06 ENCOUNTER — Other Ambulatory Visit: Payer: Self-pay | Admitting: *Deleted

## 2014-04-06 MED ORDER — OMEPRAZOLE 40 MG PO CPDR: 40.0000 mg | DELAYED_RELEASE_CAPSULE | Freq: Every day | ORAL | Status: DC

## 2014-11-30 NOTE — H&P (Signed)
PATIENT NAME:  Emma Munoz, Emma Munoz MR#:  [id number] DATE OF BIRTH:  September 08, 1965  DATE OF ADMISSION:  03/14/2012  HISTORY OF PRESENT ILLNESS: Emma Munoz is a 49 year old black female who underwent small bowel resection and anastomosis on postoperative day three from a laparoscopic cholecystectomy where she incurred a small bowel injury. This was done in April of 2013. Since that time, she has had multiple admissions to the hospital for obstipation that was thought to be narcotic related as she takes high-dose narcotics for her left upper extremity neuritis and neuropathic pain. She has done fairly well since being discharged from the hospital about three weeks ago until 1-1/2 days ago where she developed vomiting, abdominal distention, and crampy abdominal pain on top of her chronic baseline abdominal pain. She says that this episode is worse than all of the previous episodes that she has had since April. Her abdominal cramps last for about two minutes and are severe and occur on an every 15 minutes basis and that has been going on for about a week. She says that she had a normal bowel movement yesterday and passed flatus yesterday, but she is not sure that she has passed any flatus today.   PAST MEDICAL HISTORY:  1. Hypercalcemia secondary to primary hyperparathyroidism secondary to right inferior parathyroid adenoma.  2. 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  3. Hypertension.  4. History of diabetes mellitus with no current diagnosis of diabetes. 5. Narcotic dependence and tolerance.   MEDICATIONS:  1. Cyclobenzaprine 20 mg at bedtime.  2. FiberChoice 1 daily.  3. Kadian 80 mg per 24 hours extended-release twice a day. 4. Lisinopril 20 mg daily.  5. Metoprolol 100 mg daily.  6. Morphine sulfate immediate release 30 mg three to four capsules every four hours p.r.n.  7. Nexium 40 mg daily.   8. Promethazine 25 mg four times daily p.r.n.  9. Vitamin D3 50,000 international units twice a week.   ALLERGIES: Aspirin, nonsteroidal anti-inflammatory drugs, and latex.   REVIEW OF SYSTEMS: Negative for 10 systems, except the gastrointestinal system and left upper extremity chronic pain as noted in the history of present illness. Specifically, the patient does not have any symptoms related to her hypercalcemia and has not had a recurrence of her hyperglycemia for months.   SOCIAL HISTORY: The patient is married and lives with her husband and her youngest daughter. She is attended in the emergency department by her oldest daughter who does not live with her. 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 She smokes about a pack of cigarettes per day and does not drink alcohol. She does not wish to have a nicotine patch while hospitalized as her smoking has decreased significantly over the last couple of days with her nausea and abdominal cramps.   FAMILY HISTORY: Noncontributory.   PHYSICAL EXAMINATION:   GENERAL: An obese, young black female sitting up on the emergency department stretcher in no apparent distress until she has an episode of abdominal cramps. Height is 5 feet 9 inches, weight 210 pounds, and BMI 31.0.  VITALS: Temperature 98.0, pulse 110, blood pressure 161/115, and oxygen saturation 96% on room air.   HEENT: Pupils are equally round and reactive to light. Extraocular movements are intact. Sclerae are anicteric. Oropharynx is clear. Mucous membranes are moist. Hearing is intact to voice.   NECK: Supple  with no tracheal deviation or jugular venous distention.   HEART: Regular rate and rhythm with no murmurs or rubs.   LUNGS: Clear to auscultation with normal respiratory effort bilaterally.   ABDOMEN: Distended and slightly tympanitic. There is some tenderness but there are no peritoneal signs such as rebound  tenderness or involuntary guarding.   EXTREMITIES: No edema with normal capillary refill bilaterally.   NEUROLOGIC: Cranial nerves II through XII motor and sensation grossly intact.   PSYCHIATRIC: Alert and oriented x4. Appropriate affect.   ADDITIONAL STUDIES: White blood cell count 16.1, hemoglobin 18, hematocrit 53%, and platelet count 305,000.   Urinalysis is normal.   Electrolytes are normal. Calcium 11.0 with an albumin of 3.9.  Abdominal three-way x-ray reveals a nonspecific gas pattern.  CT scan of the abdomen and pelvis with contrast performed eight hours following that reveals surgically absent uterus and surgically absent gallbladder with a dilation of small bowel loops in the upper and mid abdomen (which I measured at almost 4 cm) with a transition zone in the right lower quadrant near the suture line. The patient also has a broadly neck ventral hernia with a small amount of mid transverse colon within it that is not obstructed. By my review, there is considerable amount of contrast within the stomach, but there is also significant stool within the right colon and transverse colon with some stool and gas in the left colon and rectum as well.   ASSESSMENT: Possible partial small bowel obstruction associated with an anastomotic suture line or staple line associated with nonobstructing ventral hernia and overlying narcotic-induced bowel dysmotility. In addition, the patient has hypercalcemia that is not at a dangerous level from a right inferior parathyroid adenoma.   PLAN: Admit to the hospital for IV fluid hydration, nasogastric suction, PCA narcotics for pain, and observation.  ____________________________ Consuela Mimes, MD wfm:[id number] D: 03/14/2012 00:13:52 ET     T: 03/14/2012 07:50:47 ET        JOB#: [id number] cc: Consuela Mimes, MD, <Dictator> Consuela Mimes MD ELECTRONICALLY SIGNED 03/23/2012 22:27

## 2014-11-30 NOTE — Consult Note (Signed)
PATIENT NAME:  Emma Munoz, Emma Munoz MR#:  [id number] DATE OF BIRTH:  10-Nov-1965  DATE OF CONSULTATION:  03/18/2012  REFERRING PHYSICIAN:  Dia Crawford, MD CONSULTING PHYSICIAN:  Swanton Sink, MD  REASON FOR CONSULTATION: Management of high blood pressure.  REASON FOR ADMISSION: Possible small bowel obstruction, now resolving.  HISTORY OF PRESENT ILLNESS: Emma Munoz is a very nice 49 year old female who has history of high blood pressure. She underwent small bowel resection and anastomosis after laparoscopic cholecystectomy which apparently was complicated with a small bowel injury. This was about 3 or 4 months ago. Since then she has been admitted on multiple occasions to the hospital due to obstipation which was thought to be due to narcotics due to the patient having severe left upper arm, left upper extremity pain due to neuropathic pain. The patient was admitted on 03/14/2012 with a history of two days of vomiting, abdominal distention, and abdominal pain that got worse during several days with significant cramping and signs of obstruction. The patient was evaluated by Dr. Leanora Cover. A CT scan of the abdomen was done showing dilation of small bowel loops in the upper and mid abdomen and ventral hernia which had some of the mid transverse colon but not obstructing it. The patient was made n.p.o. and admitted for possible partial small bowel obstruction. The comments from Dr. 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 As a new thing, the patient has been diagnosed with right inferior parathyroid adenoma. We were asked to see the patient mostly because she continues to have high blood pressure. Her pain has improved significantly and she is actually now able to tolerate a diet. Her blood pressure has been elevated with blood pressure 168/118, 180/121, 218/147, 180/110, 173/112 and now is 143/88. Her  heart rate is around 64. She has been n.p.o. for what she was not taking her medications other than metoprolol IV every 6 hours p.r.n. systolic blood pressures above 180 and above 95. We are going to continue this medication, but I think at this point we can add on her current medications. Discussing with the patient, she has a history of hypertension that was diagnosed in 1998 after she had a crushing injury of her hand which left her with significant pain and chronic neuropathy. At that moment, she was having issues of hypotension after taking her medications on days where her blood pressure was close to normal or normal. For that reason, she stayed taking blood pressure medications p.r.n. for a long time, but she has been taking daily medications for over 10 years. She has been taking different types of medication and as it is right now she is metoprolol and lisinopril for the past couple months since she has been diagnosed with hyperparathyroidism. Her primary care physician has been managing that and as it is right now she is getting a parathyroid test done. She states that her brother had the same issues with high blood pressure and he had acute kidney injury and hemodialysis and he was also hyperparathyroid due his chronic kidney disease more likely. The patient states that she does not have any history of cardiac disease. She is actually able to walk around without getting short of breath. She denies any orthopnea. There is no significant nocturnal dyspnea or other problems from that sense. Mostly her main issues are the pain.   REVIEW OF SYSTEMS: She denies any significant weight loss. She has chronic pain in  her arm and now on her abdomen. She denies any fever or chills at the moment. Vision seems to be normal. No changes on her prescription eyeglasses. She denies any blurry vision, scotomas, or flutters. No hearing loss. No difficulty swallowing. No masses on her neck or throat, or thrush. Denies any  neck pain, lymph nodes or stridor symptoms. Denies any cardiovascular symptoms like chest pain, shortness of breath or orthopnea, persistent nocturnal dyspnea or palpitations. Denies any respiratory symptoms like shortness of breath, dyspnea, wheezing, hemoptysis, or cough. Denies any GU symptoms like dysuria or hematuria. Positive for abdominal symptoms like pain and constipation, as mentioned in History of Present Illness. Denies any bleeding symptoms, denies any easy bruising. The patient knows that she has hypercalcemia. She denies any polyuria, polydipsia, or polyphagia. She denies any cold or heat intolerance. Positive for neuropathy of the left upper extremity due to crushing injury and significant pain. Denies any other joint problems or disease. The patient denies any current depression or anxiety. Neurologic is positive for tingling on her arm due to neuropathy and occasional headaches likely due to increase high blood pressure, but no migraines.   PAST MEDICAL HISTORY:  1. Hypertension.  2. 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  3. Left upper extremity neuritis and neuropathy.  4. Obstipation.  5. Narcotic dependency.  6. Vitamin D deficiency.   DRUG ALLERGIES: Aspirin, NSAIDs, and Lasix.  MEDICATIONS:  1. Cyclobenzaprine 20 mg at bedtime.  2. FiberChoice. 3. Kadian 80 mg extended release twice daily.  4. Lisinopril 20 mg daily.  5. Metoprolol 100 mg daily.  6. Morphine sulfate immediate release 30 mg three to four capsules every four hours.  7. Nexium 40 mg daily.  8. Promethazine 25 mg p.r.n.  9. Vitamin D3.   FAMILY HISTORY: As mentioned above, brother has history of high blood pressure and chronic kidney disease on dialysis and kidney cancer. The patient denies any history of diabetes or other cancers in the family. No history of coronary artery disease.   SOCIAL HISTORY: The patient is married. She has a husband and a  daughter. She has been unemployed due to her pain. Positive tobacco use, about 1 pack of cigarettes a day. Denies any alcohol use. The patient does not want to quit smoking.   PAST SURGICAL HISTORY:  1. Cholecystectomy.  2. Hysterectomy.  3. Multiple procedures on her left hand, elbow, and wrist for nerve releases.  4. Laparoscopy with small bowel resection after cholecystectomy.  5. Left knee surgery.  6. Left shoulder surgery.  7. Cesarean section x3. 8. Lumbar surgery x2.  PHYSICAL EXAMINATION:   VITAL SIGNS: Blood pressure right now is 143/88, pulse 64, respiratory rate 17, and temperature 98.   GENERAL: The patient is alert and oriented x3, in no acute respiratory distress, hemodynamically stable.   HEENT: Her pupils are equal and reactive. Extraocular movements are intact. Her mucosa is moist. No oropharyngeal exudates.   NECK: Supple without JVD. No thyromegaly. No masses are palpable. Trachea is central. No stridor.   CARDIOVASCULAR: Regular rate and rhythm without any murmurs, rubs, or gallops.   LUNGS: Clear without any wheezing or crepitus.   ABDOMEN: Soft, nontender, and nondistended. No hepatosplenomegaly. No masses. Bowel sounds are positive.   EXTREMITIES: No edema. No cyanosis. No clubbing. Increased sensation of pain on the left upper extremity due to neuropathy. Sensation is decreased.   GU: Deferred.   NEUROLOGIC: Cranial nerves II through XII are intact. Strength seems  to be appropriate in all four extremities, equal. Difficult to evaluate left upper extremity due to the pain, but strength seems to be her normal strength.   LYMPH: Negative for lymphadenopathy on neck or epitrochlear.   PSYCH: She has a little bit of a flat affect, but seems like it is her regular personality.   SKIN: No significant rashes.   LABORATORY, DIAGNOSTIC AND RADIOLOGIC DATA: CT scan as mentioned above in the History of Present Illness.   KUB done on 11/24/2011 showed no evidence  of obstruction. Finding of loop of air that could represent a focal ileus.   Blood sugars have been ranging around 80 to 136. Creatinine is 0.65. Electrolytes have been normal except for 03/17/2012 where potassium was 3.2. Calcium has been as high as 11. Lipase is 2.7. Albumin level is 3.4, which is about normal. Her white count was 16,000 on admission and right now has come down to 8000. Her hemoglobin is around 14.4.  Urinalysis was not showing any signs of infection. She does have 100 mg/dL of protein.   ASSESSMENT AND PLAN:  1. Small bowel obstruction, now resolved. The patient has been n.p.o. but now she is able to eat and take medications.  2. Hypertension. The patient has an interesting history of hypertension that started after her pain was uncontrolled. Now her blood pressure has been up and down. She has been taking metoprolol and lisinopril for the past two months and it has been working okay. Apparently she states that whenever her blood pressure goes too high and she gets blood pressure medications her blood pressure actually drops significantly for what we need to be careful whenever we do that. At this moment, I think she is okay to start taking back her blood pressure medications since she is eating. I am not concerned about creating hypotension with her own blood pressure medications. She is going to have metoprolol p.r.n. as ordered for systolics above 283. We are going to monitor her pulse and blood pressure after that. Overall we are going to continue to followup with Dr. Pat Patrick in case there is any need for adjustment of her blood pressure medications.  3. Primary hyperparathyroidism. The patient has a parathyroid adenoma. She is status post parathyroid scan.  Her calcium is not significantly elevated. It is as high as 11 and she has been asymptomatic for now. No need to use medications for calcium correction at this moment.  4. Her other medical problems including neuritis are under  control. Continue to take her on medications. Continue ambulation.  5. Deep vein thrombosis prophylaxis. The patient is fully ambulatory. As far as GI prophylaxis, the patient is currently taking famotidine.   TIME SPENT: I spent about 35 to 40 minutes with the patient.  ____________________________ Alburtis Sink, MD rsg:slb D: 03/18/2012 15:54:09 ET T: 03/18/2012 17:32:02 ET JOB#: [id number]  cc: Nelson Sink, MD, <Dictator> Maurissa Ambrose America Brown MD ELECTRONICALLY SIGNED 03/29/2012 13:19

## 2014-11-30 NOTE — Discharge Summary (Signed)
PATIENT NAME:  Emma Munoz, Emma Munoz MR#:  [id number] DATE OF BIRTH:  1966/04/15  DATE OF ADMISSION:  03/14/2012 DATE OF DISCHARGE:  03/19/2012  ADMITTING PHYSICIAN: Dr. Molly Maduro DISCHARGE PHYSICIAN:  Dr. Marlyce Huge   DISCHARGE DIAGNOSES: 1. Small bowel obstruction at anastomosis from prior small bowel resection with anastomosis, now resolved.  2. History of laparoscopic cholecystectomy complicated by small bowel injury requiring small bowel resection and anastomosis in April 2013.  3. Left upper extremity neuritis and neuropathic pain status post multiple procedures left hand, wrist and elbow for nerve release.  4. Hypercalcemia secondary to primary hyperthyroidism, negative sestamibi scan. 5. Hypertension. 6. Diabetes mellitus but no current diabetes. 7. Narcotic dependence/intolerance.   PROCEDURE PERFORMED: CT scan which showed small bowel obstruction at place of anastomosis. Sestamibi scan per Dr. Loletta Specter was normal without any findings to suggest parathyroid adenoma.   DISCHARGE MEDICATIONS: 1. Phenergan 25 mg p.o. t.i.d. x5 if needed. 2. Kadian 80 mg for 24 hour oral capsule extended release 1 cap orally b.i.d.  3. Flexeril 20 mg p.o. at bedtime.  4. Lisinopril 1 tablet p.o. daily. 5. Metoprolol succinate 100 mg p.o. daily. 6. Morphine sulfate immediate release 30 mg oral capsule 3 to 4 capsules orally q.4 hours p.r.n. pain. 7. FiberChoice 1 tablet p.o. daily. 8. Nexium 40 mg p.o. daily. 9. Vitamin D 5000 units p.o. daily.  HOSPITAL COURSE: Ms. Wisby was admitted with abdominal pain, nausea and vomiting and a CT scan which showed dilated bowel proximal to her anastomosis. Decision was made not to operate on her at this time because she is still pretty fresh from the surgery for gross contamination from a small bowel injury and thus this would be difficult technically and we wanted to give her adequate time to soften her adhesions. We therefore began her on an IV  Dilaudid PCA and made her n.p.o to attempt to treat this nonoperatively. Throughout her hospital course her nausea improved, her pain with eating improved and this morning we converted her to p.o. pain medications and bowel regimen. She was discharged with good pain control, was voiding and stooling without difficulty.  She still had some discomfort with PO intake but did not want to have surgery thus she was discharged to home at this time to follow up with [place] in approximately 1 to 2 weeks.   DISCHARGE INSTRUCTIONS: Patient is told to call or return to ED if has increased pain, nausea, vomiting, distention, decreased bowel movement or any other problems. She is to resume her medications prior to her discharge.   ____________________________ Glena Norfolk. Iness Pangilinan, MD cal:cms D: 03/19/2012 19:38:28 ET T: 03/20/2012 10:18:06 ET JOB#: [id number]  cc: Harrell Gave A. Preslee Regas, MD, <Dictator> Floyde Parkins MD ELECTRONICALLY SIGNED 03/21/2012 11:26

## 2014-12-05 NOTE — Op Note (Signed)
PATIENT NAME:  Emma Munoz, Emma Munoz MR#:  [id number] DATE OF BIRTH:  05-23-1966  DATE OF PROCEDURE:  11/22/2011  PREOPERATIVE DIAGNOSIS: Small bowel obstruction.   POSTOPERATIVE DIAGNOSIS: Contained perforation of the small bowel.   OPERATIVE PROCEDURES:  1. Exploratory celiotomy.  2. Small bowel resection. 3. Peritoneal lavage.   SURGEON: Hervey Ard, MD   ANESTHESIA: General endotracheal per Dr. Marcello Moores, Marcaine 0.5% plain, 30 mL local infiltration.   ANESTHESIOLOGIST: Gunnar Bulla, MD  CLINICAL NOTE: This 49 year old woman underwent a laparoscopic cholecystectomy three days ago. At that time, there was a suspected violation of the mesentery of the small bowel from the initial port placement at the umbilicus. Examination at that time had not shown evidence of an intestinal injury and no further therapy had been planned. She presented last night with reported abdominal pain and CT scan suggestive of  small bowel obstruction. There was noted to be a small amount of air in the area around the umbilicus, minimal abdominal fluid and scant fluid around the liver at the site of a previous biopsy. She was hydrated intravenously and is brought to the Operating Room today because of persistent abdominal tenderness, persistent leukocytosis, and faint redness around the umbilical site.   OPERATIVE NOTE: With the patient under adequate general endotracheal anesthesia, a Foley catheter was placed by the nurse. TED stockings and pneumatic compression stockings were used for deep vein thrombosis prophylaxis. A nasogastric tube was already in place.   The skin was prepped with ChloraPrep and draped. A midline incision was made centered on the umbilicus. The skin was incised sharply and hemostasis achieved with electrocautery. The fascia was opened, followed by the peritoneum. There was a contained perforation with multiple loops of small bowel producing a small bowel obstruction and containing the leak from  a site on the small bowel adjacent to the mesentery. This was likely the site visualized at the time of her cholecystectomy. The abdomen was explored and no other fluid collections were appreciated. Scant fluid was noted in the pelvis, no free fluid in the right upper quadrant. The small bowel segment approximately 6 inches in length was resected and a side-to-side functional end-to-end anastomosis completed making use of the GIA stapler. The mesenteric defect was closed with a single 3-0 Vicryl figure-of-eight suture. The corners of the anastomosis were reinforced with 3-0 silk sutures. The abdomen was irrigated with 1500 mL of saline. With the sponge, tape and instrument count correct, the fascia was closed with interrupted 0 Maxon sutures. The adipose layer was approximated with interrupted 2-0 Vicryl sutures after irrigation with saline and the skin closed with staples. Marcaine was infiltrated for postoperative analgesia. The wound was covered with Telfa and gauze. The patient was taken to the recovery room in stable condition.  ____________________________ Robert Bellow, MD jwb:cbb D: 11/22/2011 18:32:38 ET T: 11/23/2011 12:57:19 ET JOB#: [id number]  cc: Robert Bellow, MD, <Dictator> Eduard Clos. Gilford Rile, MD Jencarlo Bonadonna Amedeo Kinsman MD ELECTRONICALLY SIGNED 11/23/2011 15:05

## 2014-12-05 NOTE — Discharge Summary (Signed)
PATIENT NAME:  Emma Munoz, Emma Munoz MR#:  [id number] DATE OF BIRTH:  1965/11/24  DATE OF ADMISSION:  01/27/2012 DATE OF DISCHARGE:  01/30/2012  DISCHARGE DIAGNOSES:  1. Long-standing obstipation. 2. Long-term narcotic use. 3. Nausea and vomiting. 4. Inadequate maintenance of oral analgesic. 5. Essential hypertension. 6. Chronic pain.   CLINICAL NOTE: This 49 year old woman with a long history of chronic pain and long history of chronic obstipation suffered a small bowel injury during time of cholecystectomy in April 2013. This was subsequently repaired and a small bowel resection completed on postoperative day three. She had had progressive obstipation at home and reported an inability to maintain any oral liquids or narcotics. She had great fear of developing narcotic withdrawal. She was admitted for IV fluids and further assessment.   The patient was placed on an IV PCA and based on her plain films showing formed stool in the right colon she underwent a lower GI clean out with enemas. Attempts to obtain records from her gastroenterologist in [place] were unsuccessful. She was begun on clear liquids and then reintroduced to her regular oral analgesic regimen. At the time of discharge, she had had no further vomiting, and had had some improvement in her mild abdominal distention.   Home-going instructions were provided. Patient was asked to contact her primary care physician for any medications for her long-standing constipation. Outpatient follow up in my office was to take place in two weeks.         LABORATORY, DIAGNOSTIC AND RADIOLOGICAL DATA: Summary of laboratory studies during this hospitalization were notable for a white blood cell count of 8300, hemoglobin 15.7, platelet count of 257,000, creatinine 0.67 with EGFR greater than 60, electrolytes notable for a modest hyperchloremia at 108, CO2 26, nonfasting blood sugar 101.   ____________________________ Robert Bellow,  MD jwb:cms D: 02/14/2012 19:48:04 ET T: 02/15/2012 14:47:16 ET JOB#: [id number]  cc: Robert Bellow, MD, <Dictator> Anderson Malta A. Gilford Rile, MD Fran Mcree Amedeo Kinsman MD ELECTRONICALLY SIGNED 02/18/2012 11:21

## 2014-12-05 NOTE — Consult Note (Signed)
PATIENT NAME:  JAHNI, PAUL MR#:  [id number] DATE OF BIRTH:  1966-04-21  DATE OF CONSULTATION:  02/21/2012  REFERRING PHYSICIAN:   CONSULTING PHYSICIAN:  Janalee Dane, MD  HISTORY OF PRESENT ILLNESS: The patient is a 49 year old African American female who has been a long-term patient of Dr. Bary Castilla, and her most recent hospitalization was admitted for nausea, vomiting, severe crampy pain and constipation. In fact she has only one bowel movement per week typically. She has recently seen a gastroenterologist in [place] (Dr. Hilarie Fredrickson), and it is felt that her hypercalcemia may be contributing to her poor bowel motility and abdominal pain. Of note, the patient underwent laparoscopic cholecystectomy in April, and there was a small bowel injury at that time. The patient has completely recovered from that surgery but the hypercalcemia and subsequently elevated PTH levels were identified, and a sestamibi scan was ordered in [place] which reportedly shows a right inferior parathyroid adenoma.  ALLERGIES, MEDICATIONS, PAST MEDICAL HISTORY, AND PAST SURGICAL HISTORY: Reviewed and documented in the patient's hospital record.   PHYSICAL EXAMINATION:  GENERAL: The patient is a well-developed, African American female in no acute distress and very easy to discuss these issues with.   NECK: There is no palpable abnormality. The trachea is midline. Thyroid is generous but no thyroid masses are palpable. No scars in the neck.  IMPRESSION: Hypercalcemia with elevated PTH level and (reportedly) sestamibi scan suggestive of right inferior parathyroid adenoma. I had a detailed conversation with the patient, and we discussed observation versus surgical intervention. I discussed the risks including bleeding, infection, damage to the recurrent laryngeal nerve, and adverse scarring as well as potential need for 2nd parathyroid exploration. I have asked the patient to consider all of these issues, and she will  follow up next week as an outpatient. Her labs will likely need to be repeated due to the fact that initially upon admission to the hospital her calcium level was significantly elevated and there is a documented normal calcium level during her hospital admission. I have directed her to         information on the Internet about hyperparathyroidism in general and also about parathyroid adenoma, and I will discuss all of these issues as well as specifically surgical options, risks, and benefits when she returns for her outpatient visit. I will need to see the Sesta-mibi myself before proceding.    ____________________________ J. Nadeen Landau, MD jmc:vtd D: 02/22/2012 23:50:53 ET T: 02/23/2012 09:14:50 ET JOB#: [id number]  cc: Janalee Dane, MD, <Dictator> [facility] Nicholos Johns MD ELECTRONICALLY SIGNED 03/27/2012 7:53

## 2014-12-05 NOTE — H&P (Signed)
Subjective/Chief Complaint n/v, severe abd crampy pain, constipation    History of Present Illness 49 yr old female with long history of abd symptoms-postprandial n/v, abd cramps and constiopatipon. In April this yr she had lapchole, complicated by a small bowel injury from initial puncture, underwent small bowel resection. Ever since she states her abd symptoms of n/v and constipation, crampy abd pain have progressively gotten worse. Pt reports she has been miserable last couple of days and more so this evening when she called. She now goes 10-12 dyas before she initiates a bowel movement with enema or mag citrate. She is on long standing narcotic s for a crush injury to her left hand-being followed by ortho. Pt did see GI -a Dr. Amada Kingfisher  in [place] last week.    Past History Back syrgery, left knee and left elb ow surgery. Lap chole. small bowel resection, hysterectomy.    Past Medical Health Hypertension, Smoking    Primary Physician Dr. Ronette Deter, M.D.   Past Med/Surgical Hx:  migraines:   anemia:   indigestion:   Diabetes Mellitus, Type II (NIDD):   HTN:   Cholecystectomy:   left knee x 2:   hysterectomy:   left shoulder:   left hand/elbow x 2:   C-section x 3:   lumbar x 2:   ALLERGIES:  Aspirin: Anaphylaxis  NSAIDS: Anaphylaxis  Latex: Hives  HOME MEDICATIONS: Medication Instructions Status  Kadian 80 mg/24 hr oral capsule, extended release 1 cap(s) orally 2 times a day Active  MSIR 30 mg oral capsule 3 - 4 tabs orally every 4 hours prn Active  flexeril 10mg  2  orally once a day (at bedtime) Active  metoprolol succinate 100 mg oral tablet, extended release 1 tab(s) orally once a day in am Active  nexium 40mg  1 tab(s) orally once a day in am Active  vitamin D 50,000 iu 1 tab(s) orally 2 times per week Active  lisinopril 20mg  1 tab(s) orally once a day in am Active   Family and Social History:   Social History positive  tobacco, positive tobacco (Greater than  1 year), 1 ppd    Place of Living Home   Review of Systems:   Fever/Chills No    Cough No    Abdominal Pain Yes    Diarrhea No    Constipation Yes    Nausea/Vomiting Yes    SOB/DOE No    Chest Pain No    Dysuria No    Tolerating Diet No    Medications/Allergies Reviewed Medications/Allergies reviewed   Physical Exam:   GEN anxious, uncomfortable. grabs her stomach frequently due to cramps    HEENT pink conjunctivae, dry oral mucosa    NECK supple  No masses    RESP clear BS    CARD regular rate  no murmur  No LE edema    ABD positive tenderness  no liver/spleen enlargement  positive hernia  soft  normal BS  There is a wide fascial defect palpable at umbilicus    LYMPH negative neck    EXTR negative edema    SKIN normal to palpation, skin turgor good    PSYCH A+O to time, place, person, good insight, anxious     Assessment/Admission Diagnosis Intractable n/v, crampy abd pain, constipation.    Plan Admit, IV fluids, symptomatic meds for now. Labds and abd xrays pending   Electronic Signatures: Christene Lye (MD)  (Signed 09-Jul-13 22:16)  Authored: CHIEF COMPLAINT and HISTORY, PAST MEDICAL/SURGIAL HISTORY,  ALLERGIES, HOME MEDICATIONS, FAMILY AND SOCIAL HISTORY, REVIEW OF SYSTEMS, PHYSICAL EXAM, ASSESSMENT AND PLAN   Last Updated: 09-Jul-13 22:16 by Christene Lye (MD)

## 2014-12-05 NOTE — Consult Note (Signed)
PATIENT NAME:  Emma Munoz, Emma Munoz MR#:  [id number] DATE OF BIRTH:  [birth year]  DATE OF CONSULTATION:  11/22/2011  REFERRING PHYSICIAN:  Dr. Hervey Ard and also Dr. Karle Plumber PHYSICIAN:  Clovis Pu. Bayleigh Loflin, MD  REASON FOR CONSULTATION: Elevated blood pressure and tachycardia.   HISTORY OF PRESENT ILLNESS: Ms. Greenspan is a 49 year old African American female with history of recent elective cholecystectomy and liver biopsy done a few days ago on Monday. The patient was discharged home. She stated that she had abdominal pain, eased off somewhat on Tuesday then recurred back on Wednesday, associated with abdominal distention and the pain is located at the central abdominal area and the left side of the abdomen. She did not have bowel movement or pass any gas since the surgery. She was admitted to the hospital with findings of small bowel obstruction.    REVIEW OF SYSTEMS: CONSTITUTIONAL: Denies any fever. No chills. No fatigue. EYES: No blurring of vision. No double vision. ENT: No hearing impairment. No sore throat. No dysphagia. CARDIOVASCULAR: No chest pain. No shortness of breath. No syncope. RESPIRATORY: No shortness of breath. Reports mild cough. No chest pain. No palpitations. No hemoptysis. GASTROINTESTINAL: Abdominal pain as above. One episode of vomiting. No bowel movement for several days. GENITOURINARY: No dysuria. No frequency of urination. MUSCULOSKELETAL: She has chronic pain syndrome. She is maintained on high dose of narcotics. No joint swelling. No muscular pain or swelling. INTEGUMENTARY: No skin rash. No ulcers. NEUROLOGY: No focal weakness. No seizure activity. No headache. PSYCHIATRY: No anxiety. No depression. ENDOCRINE: No polyuria. No polydipsia. No heat or cold intolerance.   PAST MEDICAL HISTORY:  1. History of chronic pain syndrome, maintained on high dose of narcotics.  2. History of systemic hypertension.  3. History of gastroesophageal reflux disease.    4. History of diabetes mellitus, type 2.  5. History of migraine headache. 6. Chronic back pain.   PAST SURGICAL HISTORY:  1. Recent cholecystectomy on Monday.  2. History of hysterectomy.  3. History of back surgery x2.  4. History of left knee surgery x2. 5. History of left hand surgery.  6. History of C-section x3.   SOCIAL HABITS: Chronic smoker, 1 pack per day since age of 80. No history of alcoholism or other drug abuse.   SOCIAL HISTORY: She is on disability based on her chronic pains in her back and her left hand when she had crush injury. She is married, living with her husband and she has three children.   FAMILY HISTORY: Negative for premature coronary artery disease.   ADMISSION MEDICATIONS:  1. Nexium 40 mg a day. 2. Metoprolol succinate 100 mg once a day.  3. Lisinopril 20 mg a day.  4. Kadian 80 mg b.i.d. Patient tells me that this was reduced to 60 mg. 5. Glipizide with metformin 2.5 mg/500.  6. Flexeril 10 mg twice a day.  7. She is also on short-acting morphine and Percocet p.r.n.   ALLERGIES: Aspirin and nonsteroidal anti-inflammatory medications causes anaphylaxis. She is also allergic to latex.   PHYSICAL EXAMINATION:  VITAL SIGNS: Blood pressure 144/101, respiratory rate 22, pulse 130, temperature 99, oxygen saturation 92% on room air.   GENERAL APPEARANCE: This is a young female lying in bed in no acute distress.   HEAD AND NECK EXAMINATION: No pallor. No icterus. No cyanosis.   ENT: Hearing was normal. Nasal mucosa, lips, tongue were normal.   EYES: Normal eyelids and conjunctivae. Pupils about 3 to 4 mm, equal and  reactive to light.   NECK: Supple. Trachea at midline. No masses. No cervical lymphadenopathy.   HEART: Normal S1, S2. There is a grade 1/6 systolic murmur at the apex and left sternal border. No gallop. No carotid bruits.   RESPIRATORY: Normal breathing pattern without use of accessory muscles. No rales. No wheezing.   ABDOMEN:  Distended, soft, tender upon deep palpation, especially at the central abdominal area and the left side of the abdomen.   MUSCULOSKELETAL: No joint swelling. No clubbing.   SKIN: No ulcers. No subcutaneous nodules.   NEUROLOGIC: Cranial nerves II through XII are intact. No focal motor deficit.   PSYCHIATRIC: Patient is alert, oriented x3. Mood and affect were normal.  LABORATORY, DIAGNOSTIC, AND RADIOLOGICAL DATA: She had CAT scan of the abdomen consistent with small bowel obstruction. Serum glucose 174, follow-up was 150. BUN 11, creatinine 0.6, sodium 136, potassium 3.7. Serum protein 8, albumin 3.6, bilirubin 1.6, direct bilirubin 0.5, alkaline phosphatase 105, AST 84, ALT 75. CBC showed white count 16,000, hemoglobin 18, hematocrit 56, platelet count 294.   ASSESSMENT:  1. Elevated blood pressure and tachycardia. This could be reactive to combination of her pain and abdominal distention in addition to withdrawal from metoprolol since the patient is n.p.o. and she is not receiving her metoprolol, however, I would worry her condition being postop with possibility of pulmonary embolism.  2. Status post cholecystectomy and liver biopsy done a few days ago on Monday. 3. Small bowel obstruction, likely ileus.  4. History of systemic hypertension.  5. History of diabetes mellitus. 6. Tobacco abuse.  7. Chronic pain syndrome.   PLAN: For the time being I will resume metoprolol but intravenously using 5 mg every eight hours. Will hold the medicine if her heart rate is less than 60. Continue NG tube intermittent suction to decompress the abdomen. TED stockings for deep vein thrombosis prophylaxis. Ideally she needs to be on Lovenox, however, I am hesitant to order that since she is postop. I will leave this decision for her surgeon to decide suitable time to consider that. I will go ahead and schedule her for CTA of the chest to rule out pulmonary embolism. Will follow up on her condition.    ____________________________ Clovis Pu. Lenore Manner, MD amd:cms D: 11/22/2011 02:40:21 ET T: 11/22/2011 11:08:11 ET JOB#: [id number]  cc: Clovis Pu. Lenore Manner, MD, <Dictator> Mike Craze Irven Coe MD ELECTRONICALLY SIGNED 12/01/2011 0:32

## 2014-12-05 NOTE — Discharge Summary (Signed)
PATIENT NAME:  Emma Munoz, Emma Munoz MR#:  [id number] DATE OF BIRTH:  [birth year]  DATE OF ADMISSION:  11/21/2011 DATE OF DISCHARGE:  11/27/2011  DISCHARGE DIAGNOSES:  1. Small bowel perforation status post laparoscopic cholecystectomy.  2. Postoperative ileus.  3. Intraabdominal abscess.   CLINICAL NOTE: This 49 year old woman had undergone a laparoscopic cholecystectomy on 11/18/2009. At that time there appeared to be an injury to the mesentery of the small bowel where it was tethered to the anterior abdominal wall. There was no evidence of bowel injury and she was discharged home. She reported abdominal pain and CT scan showed evidence of a small air-fluid collection near the umbilicus suggestive of intestinal injury. She was taken to the operating room on April 11th at which time she was found to have a partial small bowel obstruction secondary to the small abscess cavity at the site of perforation. A side-to-side functional end-to-end anastomosis was created with the GIA stapler. The patient's postoperative course was notable for slow resolution of the ileus because of the need for ongoing narcotic use. This eventually did resolve and at the time of discharge she was tolerating a soft diet well and ambulating without difficulty. She had a clear cardiopulmonary exam and was felt to be a candidate for discharge.   SUMMARY OF LABORATORY STUDIES: On admission the patient's white blood cell count was 16,600 with a hemoglobin of 18.7. She was noted to have a modest elevation of the total bilirubin at 1.6 and direct bilirubin at 0.5. Serum transaminases were unremarkable. Her blood sugar was 150. After rehydration, her hemoglobin level came to 18.1 but her white blood cell count was rising and at that time it was elected to proceed to surgical intervention. At the time of discharge, white blood cell count had fallen to 12,800 with a hemoglobin of 15.7. Renal function was well preserved although she did have mild  hypokalemia with a potassium of 3.1.   The pathology specimen showed evidence of small bowel with serosal and transmural acute inflammation and serosal adhesions. Features were compatible with perforation. No evidence of malignancy or dysplasia. Margins were clear of an inflammatory process. Intraabdominal culture showed a light growth of Enterococcus, Klebsiella, and Candida.   At the time of discharge, she was to resume all of her regular medications. Percocet was provided for postoperative pain control and arrangements made for follow-up in my office on April 19th and with her primary care physician on April 25th. This covered the period of time from April 10th through April 16th.   ____________________________ Robert Bellow, MD jwb:drc D: 12/10/2011 12:15:00 ET T: 12/11/2011 08:32:59 ET JOB#: [id number]  cc: Robert Bellow, MD, <Dictator> Eduard Clos. Gilford Rile, MD Shamirah Ivan Amedeo Kinsman MD ELECTRONICALLY SIGNED 12/12/2011 7:21

## 2014-12-05 NOTE — Discharge Summary (Signed)
PATIENT NAME:  Emma Munoz, Emma Munoz MR#:  [id number] DATE OF BIRTH:  1965-12-12  DATE OF ADMISSION:  11/30/2011 DATE OF DISCHARGE:  12/04/2011  ADMISSION DIAGNOSES:  1. Postoperative ileus.  2. Non-insulin-dependent diabetes mellitus.  3. Essential hypertension.  4. Chronic pain.   CLINICAL NOTE: This 49 year old woman had been discharged two days earlier having undergone an exploration and small bowel resection for an injury suffered at the time of a laparoscopic cholecystectomy in 11/21/2011. At the time of presentation there was evidence of mild small bowel dilatation consistent with an ileus. Her laboratory studies were unremarkable. She was felt to be a candidate for observation. She was hydrated intravenously and intravenous narcotics used to minimize withdrawal symptoms. She showed slow and steady progress with the institution of oral and IV Reglan and a suppository. By post admission day 3 she hemodynamically shown marked improvement in her symptoms, primarily because of multiple bowel movements induced by the oral contrast administered for postoperative CT. The CT scan showed evidence of edema at the anastomosis and questionable enlargement of the left ovary. She was placed on a regular diet, low residue, and tolerated this well. She had her oral narcotics restarted and at the time of discharge on 04/23 she was doing well.   Written instructions were provided of when to resume her oral hypoglycemic agents. She was not to make use of her glipizide or metformin until her morning and evening blood sugars were greater than 150 on two consecutive days.   LABORATORY, DIAGNOSTIC AND RADIOLOGICAL DATA: Summary of laboratory studies are as follows: Culture of the wound which showed a small area of drainage showed rare coagulase-negative staph. Serum potassium was normal at 3.9. A CA-125 was elevated at 42.9 (0 - 34).   DISCHARGE INSTRUCTIONS: The patient was going to take responsibility for contacting  her gynecologist in [place] [address] for pelvic ultrasound. She was encouraged to call my office if this was not feasible.   Arrangements have been made for follow up in the office here in 7 to 10 days.   This is the discharge summary #2 covering the period of time from 04/19 through 04/23.  ____________________________ Robert Bellow, MD jwb:cms D: 12/10/2011 12:19:12 ET T: 12/11/2011 09:38:47 ET JOB#: [id number]  cc: Robert Bellow, MD, <Dictator> Eduard Clos. Gilford Rile, MD JEFFREY Amedeo Kinsman MD ELECTRONICALLY SIGNED 12/12/2011 7:21

## 2014-12-05 NOTE — Op Note (Signed)
PATIENT NAME:  Emma Munoz, GAFFEY MR#:  [id number] DATE OF BIRTH:  21-Jan-1966  DATE OF PROCEDURE:  11/19/2011  PREOPERATIVE DIAGNOSES:  1. Cholelithiasis.  2. Abnormal liver function studies.   POSTOPERATIVE DIAGNOSES:  1. Cholelithiasis.  2. Abnormal liver function studies.  3. Intra-abdominal adhesions.   OPERATIVE PROCEDURES:  1. Laparoscopic cholecystectomy. 2. Attempted cholangiograms. 3. Lysis of adhesions.   SURGEON: Robert Bellow, MD  ANESTHESIA: General endotracheal under Dr. Andree Elk, Marcaine 0.5% with 1:200,000 units epinephrine 20 mL local infiltration.   ESTIMATED BLOOD LOSS: Less than 5 mL.   CLINICAL NOTE: This 49 year old woman was noted to have abnormal liver function studies and ultrasound showed evidence of cholelithiasis without evidence of cholecystitis. The common bile duct measured 8.9 mm in diameter. Alkaline phosphatase is mildly elevated at 140, SGOT 166, SGPT 178. She was felt to be a candidate for cholecystectomy and liver biopsy. The latter was confirmed by phone conversation with the patient's attending physician the morning of surgery. This was discussed with the patient in the preop holding area.   OPERATIVE NOTE: With the patient under adequate general endotracheal anesthesia, the abdomen was prepped with ChloraPrep and draped. In Trendelenburg position a Veress needle was placed through a transumbilical incision. With the hanging drop test intra-abdominal location was confirmed. The abdomen was then insufflated at 12 mmHg pressure with CO2. The 10 mm step port was expanded and inspection showed evidence that the port abutted against a loop of bowel adherent to the midline about 6 cm below the umbilicus. No bleeding or evidence of succus entericus evident. An 11 mm Xcel port was placed in the epigastrium and inspection from this location ad again did not show evidence of serosal let alone mucosal violation. It was elected to proceed to cholecystectomy. The  camera was moved back to the umbilical port and two 5 mm step ports were placed under direct vision. The patient had already been placed into the reverse Trendelenburg position and rolled to the left. The gallbladder was a robin's egg blue. It was placed on cephalad traction after decompression with a needle. There was scar tissue at the neck of the gallbladder. This was cleared and the cystic duct identified. The common bile duct was approximately 3 cm away and was easily visualized and a photograph obtained. A Kumar clamp was placed across the neck of the gallbladder. Attempts at cholangiograms were unsuccessful with catheter dislodgment and no evidence of dye passing out of the gallbladder. The area was reinspected and there may have been a stone near the neck of the gallbladder. Of note, bile within the gallbladder was crystal clear, yellow in color. The cystic duct and multiple branches of the cystic artery were divided adjacent to the gallbladder. The gallbladder was then removed from the liver bed making use of hook cautery dissection. There was a small rent in the side of the gallbladder and for that reason an Endo Catch bag was utilized. It was easily delivered through the umbilical port site. The right upper quadrant was irrigated with lactated Ringer's solution and good hemostasis appreciated. A 14-gauge Bard Tru-Cut device was passed through a percutaneous site just below the costal margin on the right side and under direct vision two core biopsies were obtained of the right lobe of the liver. Prior to passing the needle the surface of the liver was cauterized to minimize post procedure bleeding and this was quite successful. A small piece of Surgicel was left in the area at the end of  the procedure.   The camera was moved back to the epigastric site and attention turned towards the loop of bowel adhesed to the anterior abdominal wall. This was sharply dissected free to allow visualization of both  sides of the bowel. There was no evidence of intestinal injury although the mesentery adjacent to the bowel had been bluntly pushed with the initial Step port placement. Photographs were obtained documenting no evidence of intestinal leakage or bleeding. The abdomen was then desufflated under direct vision and ports removed. The skin incisions were closed with 4-0 Vicryl subcuticular suture. The fascia at the umbilicus was approximated with a single 0 Vicryl suture.   The skin was approximated with benzoin, Steri-Strips, Telfa, and Tegaderm. She tolerated the procedure well and was taken to the recovery room in stable condition.       Of note, the patient's intubation did require use of the glide scope by anesthesia. The patient showed no evidence of hypoxia during induction nor any significant blood pressure change. An oral gastric tube was placed by the nurse anesthetist prior to initial port placement.  ____________________________ Robert Bellow, MD jwb:cms D: 11/19/2011 15:13:52 ET T: 11/19/2011 17:34:05 ET JOB#: [id number]  cc: Robert Bellow, MD, <Dictator> Eduard Clos. Gilford Rile, MD  Cuong Moorman Amedeo Kinsman MD ELECTRONICALLY SIGNED 11/19/2011 18:51

## 2015-11-09 ENCOUNTER — Other Ambulatory Visit (HOSPITAL_COMMUNITY): Payer: Self-pay | Admitting: Orthopedic Surgery

## 2015-11-09 DIAGNOSIS — M25562 Pain in left knee: Secondary | ICD-10-CM

## 2015-11-10 ENCOUNTER — Other Ambulatory Visit: Payer: Self-pay | Admitting: Orthopedic Surgery

## 2015-11-10 DIAGNOSIS — M25562 Pain in left knee: Secondary | ICD-10-CM

## 2015-11-18 ENCOUNTER — Ambulatory Visit
Admission: RE | Admit: 2015-11-18 | Discharge: 2015-11-18 | Disposition: A | Discharge status: Home / Self Care | Payer: Medicare Other | Source: Ambulatory Visit | Attending: Orthopedic Surgery | Admitting: Orthopedic Surgery

## 2015-11-18 DIAGNOSIS — M25562 Pain in left knee: Secondary | ICD-10-CM

## 2015-11-18 MED ORDER — IOHEXOL 180 MG/ML  SOLN
30.0000 mL | Freq: Once | INTRAMUSCULAR | Status: AC | PRN
  Administered 2015-11-18: 30 mL via INTRA_ARTICULAR

## 2015-12-30 ENCOUNTER — Encounter: Payer: Self-pay | Admitting: *Deleted

## 2016-01-18 ENCOUNTER — Ambulatory Visit (INDEPENDENT_AMBULATORY_CARE_PROVIDER_SITE_OTHER): Payer: Medicare Other | Admitting: Internal Medicine

## 2016-01-18 ENCOUNTER — Encounter: Payer: Self-pay | Admitting: Internal Medicine

## 2016-01-18 VITALS — BP 140/88 | HR 72 | Ht 67.0 in | Wt 226.0 lb

## 2016-01-18 DIAGNOSIS — Z1211 Encounter for screening for malignant neoplasm of colon: Secondary | ICD-10-CM | POA: Diagnosis not present

## 2016-01-18 DIAGNOSIS — K219 Gastro-esophageal reflux disease without esophagitis: Secondary | ICD-10-CM | POA: Diagnosis not present

## 2016-01-18 MED ORDER — OMEPRAZOLE 40 MG PO CPDR: 40.0000 mg | DELAYED_RELEASE_CAPSULE | Freq: Every day | ORAL | Status: AC

## 2016-01-18 MED ORDER — NA SULFATE-K SULFATE-MG SULF 17.5-3.13-1.6 GM/177ML PO SOLN: ORAL | Status: DC

## 2016-01-18 NOTE — Progress Notes (Signed)
   Subjective:    Patient ID: Emma Munoz, female    DOB: [birth year], 50 y.o.   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 She hasn't been seen in several years. She reports that she has been doing very well and credits Dr. Marcello Moores with improving her life dramatically as it relates to her lower abdominal pain and bowel obstructions. She is undergone 3 segmental resections and over the past 2 years has been feeling much much better. Her reflux disease controlled on omeprazole. She is taking 40 mg daily. She is tried to go without this medication on several occasions for as long as 2 weeks but her reflux, associated with nausea and vomiting returns. She was to stay on this medication. She denies dysphagia or odynophagia. She's no longer requiring laxatives and is drinking coffee every morning. This is helped with her constipation. She's having a bowel movement every day to every other day.  Review of Systems As per history of present illness, otherwise negative  Current Medications, Allergies, Past Medical History, Past Surgical History, Family History and Social History were reviewed in [employer] record.     Objective:   Physical Exam BP 140/88 mmHg  Pulse 72  Ht 5\' 7"  (1.702 m)  Wt 226 lb (102.513 kg)  BMI 35.39 kg/m2 Constitutional: Well-developed and well-nourished. No distress. HEENT: Normocephalic and atraumatic. Oropharynx is clear and moist. No oropharyngeal exudate. Conjunctivae are normal.  No scleral icterus. Neck: Neck supple. Trachea midline. Cardiovascular: Normal rate, regular rhythm and intact distal pulses. No M/R/G Pulmonary/chest: Effort normal and breath sounds normal. No wheezing, rales or rhonchi. Abdominal: Soft, obese, nontender, nondistended. Bowel sounds active throughout. There  are no masses palpable. Well healed incision Extremities: no clubbing, cyanosis, or edema Lymphadenopathy: No cervical adenopathy noted. Neurological: Alert and oriented to person place and time. Skin: Skin is warm and dry. No rashes noted. Psychiatric: Normal mood and affect. Behavior is normal.     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  1. GERD -- stable on PPI. We discussed the risks, benefits and alternatives to PPI therapy. She is tried H2 blocker in the past without benefit. She continues to have breakthrough symptoms on H2 blocker twice a day and off of PPI her reflux and dyspeptic symptoms she reports a severe. For now she wishes to stay on omeprazole. We will continue 40 mg daily. I recommended upper endoscopy to evaluate for chronic reflux injury and Barrett's esophagus. We discussed the risk benefits and alternatives and she is agreeable to proceed.  2. Colorectal cancer screening -- I recommended colonoscopy at this time for average risk screening. We discussed the risk benefits and alternatives and she is agreeable to proceed.  25 minutes spent with the patient today. Greater than 50% was spent in counseling and coordination of care with the patient

## 2016-01-18 NOTE — Patient Instructions (Signed)
You have been scheduled for an endoscopy and colonoscopy. Please follow the written instructions given to you at your visit today. Please pick up your prep supplies at the pharmacy within the next 1-3 days. If you use inhalers (even only as needed), please bring them with you on the day of your procedure. Your physician has requested that you go to www.startemmi.com and enter the access code given to you at your visit today. This web site gives a general overview about your procedure. However, you should still follow specific instructions given to you by our office regarding your preparation for the procedure.  We have sent the following medications to your pharmacy for you to pick up at your convenience: Omeprazole 40 mg daily  If you are age 48 or older, your body mass index should be between 23-30. Your Body mass index is 35.39 kg/(m^2). If this is out of the aforementioned range listed, please consider follow up with your Primary Care Provider.  If you are age 46 or younger, your body mass index should be between 19-25. Your Body mass index is 35.39 kg/(m^2). If this is out of the aformentioned range listed, please consider follow up with your Primary Care Provider.

## 2016-04-04 ENCOUNTER — Encounter: Payer: Medicare Other | Admitting: Internal Medicine

## 2016-08-17 ENCOUNTER — Telehealth: Payer: Self-pay | Admitting: Internal Medicine

## 2016-08-17 NOTE — Telephone Encounter (Signed)
Spoke to pharmacist at [facility]. Patient should have refills until 01-2017. Pharmacist states that they did receive our script for #90 w/3 refills from 01-18-16 but had another rx for 1 month supply with 0 refills so they did not see the one we sent. She will fill rx sent in June.

## 2017-02-26 ENCOUNTER — Other Ambulatory Visit: Payer: Self-pay | Admitting: Internal Medicine

## 2017-05-28 ENCOUNTER — Emergency Department (HOSPITAL_COMMUNITY)
Admission: EM | Admit: 2017-05-28 | Discharge: 2017-05-28 | Disposition: A | Discharge status: Home / Self Care | Payer: Medicare Other | Attending: Emergency Medicine | Admitting: Emergency Medicine

## 2017-05-28 ENCOUNTER — Encounter (HOSPITAL_COMMUNITY): Payer: Self-pay | Admitting: Emergency Medicine

## 2017-05-28 ENCOUNTER — Emergency Department (HOSPITAL_COMMUNITY): Payer: Medicare Other

## 2017-05-28 DIAGNOSIS — Y939 Activity, unspecified: Secondary | ICD-10-CM | POA: Insufficient documentation

## 2017-05-28 DIAGNOSIS — Y929 Unspecified place or not applicable: Secondary | ICD-10-CM | POA: Insufficient documentation

## 2017-05-28 DIAGNOSIS — S42402A Unspecified fracture of lower end of left humerus, initial encounter for closed fracture: Secondary | ICD-10-CM

## 2017-05-28 DIAGNOSIS — Y999 Unspecified external cause status: Secondary | ICD-10-CM | POA: Insufficient documentation

## 2017-05-28 DIAGNOSIS — Z885 Allergy status to narcotic agent status: Secondary | ICD-10-CM | POA: Insufficient documentation

## 2017-05-28 DIAGNOSIS — Z79899 Other long term (current) drug therapy: Secondary | ICD-10-CM | POA: Diagnosis not present

## 2017-05-28 DIAGNOSIS — W010XXA Fall on same level from slipping, tripping and stumbling without subsequent striking against object, initial encounter: Secondary | ICD-10-CM | POA: Insufficient documentation

## 2017-05-28 DIAGNOSIS — S4992XA Unspecified injury of left shoulder and upper arm, initial encounter: Secondary | ICD-10-CM | POA: Diagnosis present

## 2017-05-28 DIAGNOSIS — I10 Essential (primary) hypertension: Secondary | ICD-10-CM | POA: Insufficient documentation

## 2017-05-28 DIAGNOSIS — F17210 Nicotine dependence, cigarettes, uncomplicated: Secondary | ICD-10-CM | POA: Insufficient documentation

## 2017-05-28 LAB — BASIC METABOLIC PANEL
ANION GAP: 9 (ref 5–15)
BUN: 8 mg/dL (ref 6–20)
CALCIUM: 10.4 mg/dL — AB (ref 8.9–10.3)
CO2: 25 mmol/L (ref 22–32)
Chloride: 103 mmol/L (ref 101–111)
Creatinine, Ser: 0.71 mg/dL (ref 0.44–1.00)
GLUCOSE: 242 mg/dL — AB (ref 65–99)
POTASSIUM: 4.3 mmol/L (ref 3.5–5.1)
SODIUM: 137 mmol/L (ref 135–145)

## 2017-05-28 LAB — PROTIME-INR
INR: 0.96
PROTHROMBIN TIME: 12.7 s (ref 11.4–15.2)

## 2017-05-28 LAB — CBC WITH DIFFERENTIAL/PLATELET
BASOS ABS: 0 10*3/uL (ref 0.0–0.1)
Basophils Relative: 0 %
Eosinophils Absolute: 0 10*3/uL (ref 0.0–0.7)
Eosinophils Relative: 0 %
HEMATOCRIT: 46 % (ref 36.0–46.0)
Hemoglobin: 16.1 g/dL — ABNORMAL HIGH (ref 12.0–15.0)
LYMPHS PCT: 18 %
Lymphs Abs: 1.5 10*3/uL (ref 0.7–4.0)
MCH: 28.5 pg (ref 26.0–34.0)
MCHC: 35 g/dL (ref 30.0–36.0)
MCV: 81.6 fL (ref 78.0–100.0)
MONO ABS: 0.4 10*3/uL (ref 0.1–1.0)
Monocytes Relative: 5 %
NEUTROS ABS: 6.7 10*3/uL (ref 1.7–7.7)
NEUTROS PCT: 77 %
Platelets: 261 10*3/uL (ref 150–400)
RBC: 5.64 MIL/uL — AB (ref 3.87–5.11)
RDW: 12.9 % (ref 11.5–15.5)
WBC: 8.7 10*3/uL (ref 4.0–10.5)

## 2017-05-28 LAB — TYPE AND SCREEN
ABO/RH(D): O POS
Antibody Screen: NEGATIVE

## 2017-05-28 LAB — APTT: aPTT: 27 seconds (ref 24–36)

## 2017-05-28 LAB — ABO/RH: ABO/RH(D): O POS

## 2017-05-28 MED ORDER — MORPHINE SULFATE (PF) 4 MG/ML IV SOLN
4.0000 mg | Freq: Once | INTRAVENOUS | Status: AC
  Administered 2017-05-28: 4 mg via INTRAVENOUS
  Filled 2017-05-28: qty 1

## 2017-05-28 MED ORDER — ONDANSETRON 4 MG PO TBDP: ORAL_TABLET | ORAL | 0 refills | Status: DC

## 2017-05-28 MED ORDER — ONDANSETRON HCL 4 MG/2ML IJ SOLN
4.0000 mg | Freq: Once | INTRAMUSCULAR | Status: AC
  Administered 2017-05-28: 4 mg via INTRAVENOUS
  Filled 2017-05-28: qty 2

## 2017-05-28 MED ORDER — OXYCODONE-ACETAMINOPHEN 5-325 MG PO TABS: 1.0000 | ORAL_TABLET | Freq: Four times a day (QID) | ORAL | 0 refills | Status: DC | PRN

## 2017-05-28 NOTE — ED Notes (Signed)
ORTHO AT BEDSIDE.

## 2017-05-28 NOTE — ED Notes (Signed)
Family at bedside. 

## 2017-05-28 NOTE — ED Notes (Signed)
EDP Provider at bedside. 

## 2017-05-28 NOTE — ED Notes (Signed)
EDP [facility] Provider at bedside. PT AWARE OF RESULT OF FRACTURE TO ELBOW. PT DECLINES ICE AT THIS TIME

## 2017-05-28 NOTE — ED Triage Notes (Signed)
Per EMS-states patient tripped and tried to catch herself-left forearm/elbow deformity

## 2017-05-28 NOTE — ED Provider Notes (Signed)
[facility] DEPT Provider Note   CSN: [id number] Arrival date & time: 05/28/17  1046     History   Chief Complaint Chief Complaint  Patient presents with  . Fall    HPI MAANASA ADERHOLD is a 51 y.o. female.  Patient is a 51 year old female who presents with arm pain after fall. She states she still doesn't have power her house and tripped in the darkness and fell over onto her left elbow. She states she did not hit her head. There is no loss of consciousness. She denies any neck or back pain. She has constant throbbing pain in her left arm. It seems to be radiating from the elbow but hurts all of them down the left arm. She denies any other injuries from the fall. She's had prior surgery on her carpal tunnel area and ulnar nerve in that arm. She denies any numbness or weakness in the hand.      Past Medical History:  Diagnosis Date  . Anemia   . Depression   . Gastroparesis   . GERD (gastroesophageal reflux disease)   . Heart murmur   . Herniated cervical disc   . Hyperglycemia   . Hyperlipidemia   . Hyperparathyroidism ([place])   . Hypertension   . IBS (irritable bowel syndrome)   . Insomnia   . Metabolic syndrome X   . Migraines   . Obesity   . Primary hyperparathyroidism ([place])   . Reflex sympathetic dystrophy   . SBO (small bowel obstruction) ([place])   . 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 Tobacco abuse     Patient Active Problem List   Diagnosis Date Noted  . Hypercalcemia 10/13/2012  . Nausea and vomiting 10/10/2012  . Infected hematoma following procedure 05/15/2012  . Constipation   . IBS (irritable bowel syndrome)   . Hypoparathyroidism ([place]) 03/27/2012  . Fatigue 12/21/2011  . Abdominal pain 12/06/2011  . Open abdominal wall wound 12/06/2011  . Preop cardiovascular exam 11/16/2011  . Hypertension   . Primary hyperparathyroidism ([place]) 11/01/2011  . Nausea 10/19/2011  . MIGRAINES, HX  OF 07/22/2009  . PARESTHESIA 07/14/2008  . ALLERGIC RHINITIS 05/19/2008  . FATIGUE 06/18/2007  . HYPERLIPIDEMIA 06/17/2007  . METABOLIC SYNDROME X 38/05/1750  . OBESITY 06/17/2007  . ANEMIA-NOS 06/17/2007  . TOBACCO ABUSE 06/17/2007  . DEPRESSION 06/17/2007  . 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 HYPERTENSION 06/17/2007  . GERD 06/17/2007  . GASTROPARESIS 06/17/2007  . HERNIATED CERVICAL DISC 06/17/2007  . INSOMNIA 06/17/2007    Past Surgical History:  Procedure Laterality Date  . BACK SURGERY    . BOWEL RESECTION  04/25/2012   Procedure: SMALL BOWEL RESECTION;  Surgeon: Leighton Ruff, MD;  Location: [facility];  Service: General;  Laterality: N/A;  small bowell resection, incisional hernia repair  . CESAREAN SECTION  85, 87, 95  . CHOLECYSTECTOMY  11/2011   Dr. Bary Castilla [facility]) - bowel perforation and repair  . COLLATERAL LIGAMENT REPAIR, KNEE  1987; 1990's   left  . HERNIA REPAIR  04/2012; 05/13/2012   incisional  . Hystrectomy  2005  . IMPLANTATION VAGAL NERVE STIMULATOR  07/2011   "lower back" (05/13/2012)  . INCISIONAL HERNIA REPAIR  04/25/2012   Procedure: HERNIA REPAIR INCISIONAL;  Surgeon: Leighton Ruff, MD;  Location: [facility];  Service: General;  Laterality: N/A;  . LAPAROTOMY N/A 01/07/2013   Procedure: EXPLORATORY LAPAROTOMY,  LYSIS OF ADHESIONS , SMALL BOWEL RESECTION;  Surgeon: Leighton Ruff, MD;  Location: [facility];  Service: General;  Laterality: N/A;  . [place]; 2000  . MYOMECTOMY  [id number]   "multiple; mostly w/scopes" (05/13/2012)  . SHOULDER ARTHROSCOPY W/ ROTATOR CUFF REPAIR  ~ 2005   left  . TUBAL LIGATION  1995  . ULNAR NERVE REPAIR  1998   left "got my hand crushed"    OB History    No data available       Home Medications    Prior to Admission medications   Medication Sig Start Date End Date Taking? Authorizing Provider  cetirizine (ZYRTEC) 10 MG tablet Take 10 mg by mouth daily as needed for allergies (itching).   Yes  [provider]  cyclobenzaprine (FLEXERIL) 10 MG tablet Take 20 mg by mouth at bedtime as needed for muscle spasms.    Yes [provider]  diphenhydrAMINE (BENADRYL) 25 mg capsule Take 25 mg by mouth every 6 (six) hours as needed for itching.   Yes [provider]  fluticasone (FLONASE) 50 MCG/ACT nasal spray Place 1 spray into both nostrils daily. 05/16/17  Yes [provider]  hydrOXYzine (ATARAX/VISTARIL) 10 MG tablet Take 10 mg by mouth 3 (three) times daily as needed for itching. 05/16/17  Yes [provider]  metoprolol succinate (TOPROL-XL) 50 MG 24 hr tablet Take 50 mg by mouth daily. 05/16/17  Yes [provider]  omeprazole (PRILOSEC) 40 MG capsule Take 1 capsule (40 mg total) by mouth daily. 01/18/16  Yes Pyrtle, Lajuan Lines, MD  Polyethyl Glycol-Propyl Glycol (SYSTANE) 0.4-0.3 % SOLN Apply 1 drop to eye daily as needed (dry eyes).   Yes [provider]  ranitidine (ZANTAC) 150 MG tablet Take 150 mg by mouth 2 (two) times daily. 05/16/17  Yes [provider]  Vitamin D, Ergocalciferol, (DRISDOL) 50000 UNITS CAPS Take 50,000 Units by mouth every other day.    Yes [provider]  ondansetron (ZOFRAN ODT) 4 MG disintegrating tablet 4mg  ODT q4 hours prn nausea/vomit 05/28/17   Malvin Johns, MD  oxyCODONE-acetaminophen (PERCOCET) 5-325 MG tablet Take 1-2 tablets by mouth every 6 (six) hours as needed. 05/28/17   Malvin Johns, MD    Family History Family History  Problem Relation Age of Onset  . Early death Father 94       GUNSHOT  . Kidney disease Brother        s/p transplant  . Heart disease Maternal Grandmother        Heart defect  . Prostate cancer Maternal Grandfather   . Cancer Maternal Grandfather        prostate    Social History Social History  Substance Use Topics  . Smoking status: Current Every Day Smoker    Packs/day: 1.00    Years: 24.00    Types: Cigarettes  . Smokeless tobacco: Never  Used     Comment: 12/22/2012 offered smoking cessation materials; pt declines  . Alcohol use No     Comment: Rarely     Allergies   Aspirin; Dilaudid [hydromorphone hcl]; Ibuprofen; Latex; Nsaids; Gadolinium derivatives; and Statins   Review of Systems Review of Systems  Constitutional: Negative for fever.  Gastrointestinal: Negative for nausea and vomiting.  Musculoskeletal: Positive for arthralgias and joint swelling. Negative for back pain and neck pain.  Skin: Negative for wound.  Neurological: Negative for weakness, numbness and headaches.     Physical Exam Updated Vital Signs BP (!) 158/97   Pulse (!) 101  Temp 98.4 F (36.9 C) (Oral)   Resp 18   Ht 5\' 7"  (1.702 m)   Wt 101.2 kg (223 lb)   SpO2 95%   BMI 34.93 kg/m   Physical Exam  Constitutional: She is oriented to person, place, and time. She appears well-developed and well-nourished.  HENT:  Head: Normocephalic and atraumatic.  Neck: Normal range of motion. Neck supple.  No pain along the cervical thoracic or lumbosacral spine  Cardiovascular: Normal rate.   Pulmonary/Chest: Effort normal.  Musculoskeletal: She exhibits edema and tenderness.  Positive mild swelling and tenderness diffusely around the left elbow. She also has pain on palpation of the left shoulder and left forearm and wrist. There is no pain to the hand. She has normal sensation and motor function in the hand. Radial pulses are intact.No wounds are noted.  Neurological: She is alert and oriented to person, place, and time.  Skin: Skin is warm and dry.  Psychiatric: She has a normal mood and affect.     ED Treatments / Results  Labs (all labs ordered are listed, but only abnormal results are displayed) Labs Reviewed  BASIC METABOLIC PANEL - Abnormal; Notable for the following:       Result Value   Glucose, Bld 242 (*)    Calcium 10.4 (*)    All other components within normal limits  CBC WITH DIFFERENTIAL/PLATELET - Abnormal; Notable  for the following:    RBC 5.64 (*)    Hemoglobin 16.1 (*)    All other components within normal limits  APTT  PROTIME-INR  TYPE AND SCREEN    EKG  EKG Interpretation None       Radiology Dg Elbow Complete Left  Result Date: 05/28/2017 CLINICAL DATA:  Recent fall with elbow pain EXAM: LEFT ELBOW - COMPLETE 3+ VIEW COMPARISON:  None. FINDINGS: There is a comminuted fracture of the distal humerus through the region of the epicondyles and also extending into the elbow joint at the articulation of the radius and humerus. Some anterior displacement of the distal fracture fragments is noted. Joint effusion is seen. No dislocation of the elbow joint is seen. IMPRESSION: Comminuted distal humeral fracture which extends into the elbow joint. Anterior displacement of the distal fracture fragments is noted. Electronically Signed   By: Inez Catalina M.D.   On: 05/28/2017 11:57   Dg Forearm Left  Result Date: 05/28/2017 CLINICAL DATA:  Recent fall with forearm pain, initial encounter EXAM: LEFT FOREARM - 2 VIEW COMPARISON:  None. FINDINGS: The radius and ulna appear within normal limits. There is a comminuted fracture of the distal humerus identified which is incompletely evaluated on this exam. There appears to be anterior displacement of the distal fracture fragments with respect to the more proximal humerus. This will be better evaluated on elbow imaging. IMPRESSION: Comminuted distal humeral fracture. No abnormality of the radius and ulna is seen on these images. Electronically Signed   By: Inez Catalina M.D.   On: 05/28/2017 11:55   Dg Shoulder Left  Result Date: 05/28/2017 CLINICAL DATA:  Recent fall with known distal humeral fracture EXAM: LEFT SHOULDER - 2+ VIEW COMPARISON:  None. FINDINGS: No acute fracture or dislocation is noted. The underlying rib cage is unremarkable. IMPRESSION: No acute abnormality noted. Electronically Signed   By: Inez Catalina M.D.   On: 05/28/2017 11:58     Procedures Procedures (including critical care time)  Medications Ordered in ED Medications  morphine 4 MG/ML injection 4 mg (4 mg Intravenous Given  05/28/17 1155)  ondansetron (ZOFRAN) injection 4 mg (4 mg Intravenous Given 05/28/17 1155)  morphine 4 MG/ML injection 4 mg (4 mg Intravenous Given 05/28/17 1252)  morphine 4 MG/ML injection 4 mg (4 mg Intravenous Given 05/28/17 1541)     Initial Impression / Assessment and Plan / ED Course  I have reviewed the triage vital signs and the nursing notes.  Pertinent labs & imaging results that were available during my care of the patient were reviewed by me and considered in my medical decision making (see chart for details).     Patient is a 51 year old female who presents with elbow pain after a fall. She has evidence of a complicated elbow fracture through the epicondyles of the distal humerus. I initially paged orthopedics to advised to contact hand surgery. I spoke with the PA who works with Dr. Amedeo Plenty who advises that they feel this should be handled by the ortho trauma orthopedist.  I spoke with Dr. Doreatha Martin who will be able to handle the fracture, that he requested patient be placed in a posterior splint sling and follow-up in his office on Tuesday. He also requested a CT be performed. CT was done and patient was placed in a posterior splint. She was given prescriptions for Percocet and Zofran. She was advised to contact Dr. Doreatha Martin office. Return precautions were given.  Final Clinical Impressions(s) / ED Diagnoses   Final diagnoses:  Elbow fracture, left, closed, initial encounter    New Prescriptions New Prescriptions   ONDANSETRON (ZOFRAN ODT) 4 MG DISINTEGRATING TABLET    4mg  ODT q4 hours prn nausea/vomit   OXYCODONE-ACETAMINOPHEN (PERCOCET) 5-325 MG TABLET    Take 1-2 tablets by mouth every 6 (six) hours as needed.     Malvin Johns, MD 05/28/17 [phone]

## 2017-05-28 NOTE — ED Notes (Signed)
Patient transported to CT 

## 2017-05-28 NOTE — ED Notes (Signed)
SPLINT TO LLE REMOVED BY EDP [facility]. PT TOLERATED. PULSES PRESENT

## 2018-01-20 ENCOUNTER — Encounter: Payer: Self-pay | Admitting: Internal Medicine

## 2018-01-20 ENCOUNTER — Ambulatory Visit (INDEPENDENT_AMBULATORY_CARE_PROVIDER_SITE_OTHER): Payer: Medicare Other | Admitting: Internal Medicine

## 2018-01-20 ENCOUNTER — Other Ambulatory Visit (INDEPENDENT_AMBULATORY_CARE_PROVIDER_SITE_OTHER): Payer: Medicare Other

## 2018-01-20 VITALS — BP 130/90 | HR 84 | Ht 66.25 in | Wt 226.4 lb

## 2018-01-20 DIAGNOSIS — R945 Abnormal results of liver function studies: Secondary | ICD-10-CM | POA: Diagnosis not present

## 2018-01-20 DIAGNOSIS — E213 Hyperparathyroidism, unspecified: Secondary | ICD-10-CM

## 2018-01-20 DIAGNOSIS — Z1211 Encounter for screening for malignant neoplasm of colon: Secondary | ICD-10-CM

## 2018-01-20 DIAGNOSIS — R7989 Other specified abnormal findings of blood chemistry: Secondary | ICD-10-CM

## 2018-01-20 LAB — COMPREHENSIVE METABOLIC PANEL
ALBUMIN: 4.4 g/dL (ref 3.5–5.2)
ALK PHOS: 179 U/L — AB (ref 39–117)
ALT: 130 U/L — ABNORMAL HIGH (ref 0–35)
AST: 73 U/L — ABNORMAL HIGH (ref 0–37)
BUN: 11 mg/dL (ref 6–23)
CALCIUM: 11.6 mg/dL — AB (ref 8.4–10.5)
CO2: 27 mEq/L (ref 19–32)
Chloride: 99 mEq/L (ref 96–112)
Creatinine, Ser: 0.75 mg/dL (ref 0.40–1.20)
GFR: 105.05 mL/min (ref >60.00)
Glucose, Bld: 324 mg/dL — ABNORMAL HIGH (ref 70–99)
POTASSIUM: 4.4 meq/L (ref 3.5–5.1)
SODIUM: 134 meq/L — AB (ref 135–145)
Total Bilirubin: 0.7 mg/dL (ref 0.2–1.2)
Total Protein: 7.8 g/dL (ref 6.0–8.3)

## 2018-01-20 LAB — FERRITIN: Ferritin: 228.1 ng/mL (ref 10.0–291.0)

## 2018-01-20 LAB — CBC WITH DIFFERENTIAL/PLATELET
BASOS PCT: 0.3 % (ref 0.0–3.0)
Basophils Absolute: 0 10*3/uL (ref 0.0–0.1)
EOS PCT: 2.6 % (ref 0.0–5.0)
Eosinophils Absolute: 0.2 10*3/uL (ref 0.0–0.7)
HEMATOCRIT: 44 % (ref 36.0–46.0)
HEMOGLOBIN: 15.1 g/dL — AB (ref 12.0–15.0)
LYMPHS PCT: 50.1 % — AB (ref 12.0–46.0)
Lymphs Abs: 2.9 10*3/uL (ref 0.7–4.0)
MCHC: 34.2 g/dL (ref 30.0–36.0)
MCV: 82.5 fl (ref 78.0–100.0)
MONO ABS: 0.4 10*3/uL (ref 0.1–1.0)
Monocytes Relative: 7.1 % (ref 3.0–12.0)
Neutro Abs: 2.3 10*3/uL (ref 1.4–7.7)
Neutrophils Relative %: 39.9 % — ABNORMAL LOW (ref 43.0–77.0)
Platelets: 260 10*3/uL (ref 150.0–400.0)
RBC: 5.34 Mil/uL — AB (ref 3.87–5.11)
RDW: 13.4 % (ref 11.5–15.5)
WBC: 5.8 10*3/uL (ref 4.0–10.5)

## 2018-01-20 LAB — PROTIME-INR
INR: 1.1 ratio — ABNORMAL HIGH (ref 0.8–1.0)
Prothrombin Time: 12.4 s (ref 9.6–13.1)

## 2018-01-20 LAB — IBC PANEL
Iron: 109 ug/dL (ref 42–145)
SATURATION RATIOS: 26.7 % (ref 20.0–50.0)
Transferrin: 292 mg/dL (ref 212.0–360.0)

## 2018-01-20 MED ORDER — SUPREP BOWEL PREP KIT 17.5-3.13-1.6 GM/177ML PO SOLN: 1.0000 | ORAL | 0 refills | Status: DC

## 2018-01-20 NOTE — Patient Instructions (Signed)
Your provider has requested that you go to the basement level for lab work before leaving today. Press "B" on the elevator. The lab is located at the first door on the left as you exit the elevator.  You have been scheduled for an abdominal ultrasound at [facility] (1st floor of hospital) on 01/24/18 at 9:30 am. Please arrive 15 minutes prior to your appointment for registration. Make certain not to have anything to eat or drink 6 hours prior to your appointment. Should you need to reschedule your appointment, please contact radiology at [phone]. This test typically takes about 30 minutes to perform.  You have been scheduled for a colonoscopy. Please follow written instructions given to you at your visit today.  Please pick up your prep supplies at the pharmacy within the next 1-3 days. If you use inhalers (even only as needed), please bring them with you on the day of your procedure. Your physician has requested that you go to www.startemmi.com and enter the access code given to you at your visit today. This web site gives a general overview about your procedure. However, you should still follow specific instructions given to you by our office regarding your preparation for the procedure.  You have been scheduled for an appointment with Dr Harlow Asa at [facility]. Your appointment is on ___________ at _____________. Please arrive at __________ for registration. Make certain to bring a list of current medications, including any over the counter medications or vitamins. Also bring your co-pay if you have one as well as your insurance cards. [facility] is located at [address]. Should you need to reschedule your appointment, please contact them at [phone].  If you are age 34 or older, your body mass index should be between 23-30. Your Body mass index is 36.26 kg/m. If this is out of the aforementioned range listed, please consider follow up  with your Primary Care Provider.  If you are age 53 or younger, your body mass index should be between 19-25. Your Body mass index is 36.26 kg/m. If this is out of the aformentioned range listed, please consider follow up with your Primary Care Provider.

## 2018-01-20 NOTE — Addendum Note (Signed)
Addended by: Larina Bras on: 01/20/2018 01:56 PM   Modules accepted: Orders

## 2018-01-20 NOTE — Progress Notes (Signed)
Patient ID: TINLEY ROUGHT, female   DOB: [birth year], 52 y.o.   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 Veronia Beets for elevated liver enzymes.  She is here today with her husband.  She was previously seen and followed for reflux and her history of small bowel obstructions.  At the time of that visit, 2 years ago, colonoscopy was recommended but the patient canceled it.  She states that her liver enzymes were elevated when checked by primary care and she was referred here.  She denies any specific liver symptoms but states that anytime that her labs are "out of whack" with her calcium then this affects her blood sugars.  She has been seen by ear nose and throat doctor and is considering parathyroidectomy.  She is interested in a second opinion.  She is taking opioid pain medications now due to flare of her chronic regional pain syndrome after an elbow fracture.  No abdominal pain.  Weight has been overall stable though fluctuates 5 to 10 pounds.  No jaundice.  No lower extremity edema.  No nausea or vomiting.  Constipation if she does not take MiraLAX with her coffee every day.  No blood in her stool or melena.  She takes Prilosec 40 mg daily which well controls her reflux disease.  She occasionally uses ranitidine.  She very very rarely drinks alcohol maybe 1 or 2 drinks twice a year.  No known family history of liver disease.  No family history of colon cancer.  Past Medical History:  Diagnosis Date  . Anemia   . CRPS (complex regional pain syndrome type II)   . Depression   . Gastroparesis   . GERD  (gastroesophageal reflux disease)   . Heart murmur   . Herniated cervical disc   . Hyperglycemia   . Hyperlipidemia   . Hyperparathyroidism ([place])   . Hypertension   . IBS (irritable bowel syndrome)   . Insomnia   . Metabolic syndrome X   . Migraines   . Obesity   . Primary hyperparathyroidism ([place])   . Reflex sympathetic dystrophy   . SBO (small bowel obstruction) ([place])   . 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 Tobacco abuse     Past Surgical History:  Procedure Laterality Date  . BACK SURGERY    . BOWEL RESECTION  04/25/2012   Procedure: SMALL BOWEL RESECTION;  Surgeon: Leighton Ruff, MD;  Location: [facility];  Service: General;  Laterality: N/A;  small bowell resection, incisional hernia repair  . CESAREAN SECTION  85, 87, 95  . CHOLECYSTECTOMY  11/2011   Dr. Bary Castilla [facility] regional) - bowel perforation and repair  . COLLATERAL LIGAMENT REPAIR, KNEE  1987; 1990's   left  . HERNIA REPAIR  04/2012; 05/13/2012   incisional  . Hystrectomy  2005  . IMPLANTATION VAGAL NERVE STIMULATOR  07/2011   "lower back" (05/13/2012)  . INCISIONAL HERNIA REPAIR  04/25/2012   Procedure: HERNIA REPAIR INCISIONAL;  Surgeon: Leighton Ruff, MD;  Location: [facility];  Service: General;  Laterality: N/A;  . LAPAROTOMY N/A 01/07/2013   Procedure: EXPLORATORY LAPAROTOMY,  LYSIS OF ADHESIONS , SMALL BOWEL RESECTION;  Surgeon: Leighton Ruff, MD;  Location: [facility];  Service: General;  Laterality: N/A;  . [place]; 2000  . MYOMECTOMY  [id number]   "multiple; mostly w/scopes" (05/13/2012)  . SHOULDER ARTHROSCOPY W/ ROTATOR CUFF REPAIR  ~ 2005   left  . TUBAL LIGATION  1995  . ULNAR NERVE REPAIR  1998   left "got my hand crushed"    Outpatient Medications Prior to Visit  Medication Sig Dispense Refill  . alendronate (FOSAMAX) 70 MG tablet Take 1 tablet by mouth once a week.  0  . amLODipine-olmesartan (AZOR) 10-40 MG tablet Take 1 tablet by mouth daily.    .  cyclobenzaprine (FLEXERIL) 10 MG tablet Take 20 mg by mouth at bedtime as needed for muscle spasms.     . hydrOXYzine (ATARAX/VISTARIL) 10 MG tablet Take 10 mg by mouth 3 (three) times daily as needed for itching.  0  . metFORMIN (GLUCOPHAGE-XR) 500 MG 24 hr tablet Take 1 tablet by mouth daily.    . metoprolol succinate (TOPROL-XL) 50 MG 24 hr tablet Take 50 mg by mouth daily.  0  . morphine (MS CONTIN) 30 MG 12 hr tablet Take 1 tablet by mouth 2 (two) times daily.    [place] Kitchen omeprazole (PRILOSEC) 40 MG capsule Take 1 capsule (40 mg total) by mouth daily. 90 capsule 3  . oxyCODONE-acetaminophen (PERCOCET) 5-325 MG tablet Take 1-2 tablets by mouth every 6 (six) hours as needed. 30 tablet 0  . OYSCO 500 500 MG TABS Take 1 tablet by mouth daily.  2  . ranitidine (ZANTAC) 150 MG tablet Take 150 mg by mouth 2 (two) times daily.  0  . Tapentadol HCl (NUCYNTA) 100 MG TABS Take 1 tablet by mouth every 12 (twelve) hours as needed.    . Vitamin D, Ergocalciferol, (DRISDOL) 50000 UNITS CAPS Take 50,000 Units by mouth every other day.     . cetirizine (ZYRTEC) 10 MG tablet Take 10 mg by mouth daily as needed for allergies (itching).    . diphenhydrAMINE (BENADRYL) 25 mg capsule Take 25 mg by mouth every 6 (six) hours as needed for itching.    . fluticasone (FLONASE) 50 MCG/ACT nasal spray Place 1 spray into both nostrils daily.  0  . metoprolol succinate (TOPROL-XL) 50 MG 24 hr tablet Take 1 tablet by mouth daily.    . ondansetron (ZOFRAN ODT) 4 MG disintegrating tablet '4mg'$  ODT q4 hours prn nausea/vomit 10 tablet 0  . Polyethyl Glycol-Propyl Glycol (SYSTANE) 0.4-0.3 % SOLN Apply 1 drop to eye daily as needed (dry eyes).     No facility-administered medications prior to visit.     Allergies  Allergen Reactions  . Aspirin Anaphylaxis    REACTION: Tongue, throat, extremities swell  . Dilaudid [Hydromorphone Hcl] Nausea And Vomiting  . Ibuprofen Anaphylaxis    REACTION: Tongue, throat, extremities swell  .  Latex Hives  . Nsaids Anaphylaxis    REACTION: Tongue, throat, extremities swell  . Gadolinium Derivatives Nausea And Vomiting and Other (See Comments)    Pt states nausea and vomiting to GAD in MRI pt states she does ok with iv/cm in cat scan  . Statins Other (See Comments)    fatigue    Family History  Problem Relation Age of Onset  . Early death Father 75       GUNSHOT  . Kidney disease Brother        s/p transplant  . Heart disease Maternal Grandmother  Heart defect  . Prostate cancer Maternal Grandfather   . Cancer Maternal Grandfather        prostate    Social History   Tobacco Use  . Smoking status: Current Every Day Smoker    Packs/day: 1.00    Years: 24.00    Pack years: 24.00    Types: Cigarettes  . Smokeless tobacco: Never Used  . Tobacco comment: 12/22/2012 offered smoking cessation materials; pt declines  Substance Use Topics  . Alcohol use: No    Comment: Rarely  . Drug use: No    ROS: As per history of present illness, otherwise negative  BP 130/90 (BP Location: Right Arm, Patient Position: Sitting, Cuff Size: Normal)   Pulse 84   Ht 5' 6.25" (1.683 m) Comment: height measured without shoes  Wt 226 lb 6 oz (102.7 kg)   BMI 36.26 kg/m  Constitutional: Well-developed and well-nourished. No distress. HEENT: Normocephalic and atraumatic. Conjunctivae are normal.  No scleral icterus. Neck: Neck supple. Trachea midline. Cardiovascular: Normal rate, regular rhythm and intact distal pulses.  Pulmonary/chest: Effort normal and breath sounds normal. No wheezing, rales or rhonchi. Abdominal: Soft, nontender, nondistended. Bowel sounds active throughout. Extremities: no clubbing, cyanosis, or edema Neurological: Alert and oriented to person place and time. Skin: Skin is warm and dry.  Psychiatric: Normal mood and affect. Behavior is normal.  RELEVANT LABS AND IMAGING: Labs reviewed by care everywhere: Viral hepatitis panel negative  Labs from  11/11/2017: AST 102, ALT 91, alk phos 222, total bilirubin 0.2, calcium 11.0, albumin 4.6, creatinine 0.82, glucose 348   CBC    Component Value Date/Time   WBC 5.8 01/20/2018 1053   RBC 5.34 (H) 01/20/2018 1053   HGB 15.1 (H) 01/20/2018 1053   HGB 15.9 03/19/2012 0610   HCT 44.0 01/20/2018 1053   HCT 46.7 03/19/2012 0610   PLT 260.0 01/20/2018 1053   PLT 260 03/19/2012 0610   MCV 82.5 01/20/2018 1053   MCV 84 03/19/2012 0610   MCH 28.5 05/28/2017 1512   MCHC 34.2 01/20/2018 1053   RDW 13.4 01/20/2018 1053   RDW 14.1 03/19/2012 0610   LYMPHSABS 2.9 01/20/2018 1053   LYMPHSABS 4.6 (H) 03/19/2012 0610   MONOABS 0.4 01/20/2018 1053   MONOABS 0.8 03/19/2012 0610   EOSABS 0.2 01/20/2018 1053   EOSABS 0.4 03/19/2012 0610   BASOSABS 0.0 01/20/2018 1053   BASOSABS 0.0 03/19/2012 0610    CMP     Component Value Date/Time   NA 137 05/28/2017 1512   NA 139 03/19/2012 0610   K 4.3 05/28/2017 1512   K 4.3 03/19/2012 0610   CL 103 05/28/2017 1512   CL 108 (H) 03/19/2012 0610   CO2 25 05/28/2017 1512   CO2 25 03/19/2012 0610   GLUCOSE 242 (H) 05/28/2017 1512   GLUCOSE 102 (H) 03/19/2012 0610   BUN 8 05/28/2017 1512   BUN 4 (L) 03/19/2012 0610   CREATININE 0.71 05/28/2017 1512   CREATININE 0.62 04/16/2012 1419   CALCIUM 10.4 (H) 05/28/2017 1512   CALCIUM 10.6 (H) 03/19/2012 0610   CALCIUM 11.0 (H) 12/21/2011 1432   PROT 7.8 01/06/2013 1543   PROT 6.8 03/17/2012 1002   ALBUMIN 3.7 01/06/2013 1543   ALBUMIN 3.4 03/17/2012 1002   AST 85 (H) 01/06/2013 1543   AST 37 03/17/2012 1002   ALT 61 (H) 01/06/2013 1543   ALT 51 03/17/2012 1002   ALKPHOS 165 (H) 01/06/2013 1543   ALKPHOS 109 03/17/2012 1002   BILITOT  0.3 01/06/2013 1543   BILITOT 0.6 03/17/2012 1002   GFRNONAA >60 05/28/2017 1512   GFRNONAA >60 03/19/2012 0610   GFRAA >60 05/28/2017 1512   GFRAA >60 03/19/2012 0610    ASSESSMENT/PLAN: 52 year old female with a history of small bowel obstructions secondary to  adhesive disease status post prior segmental bowel resections and lysis of adhesions, history of constipation, GERD, complex regional pain syndrome, primary hyperparathyroidism with hypercalcemia, diabetes, hypertension, hyperlipidemia who is seen in consultation at the request of Joni Reining, PA-C and Dr. Veronia Beets for elevated liver enzymes.   1.  Elevated liver enzymes --she has had periodic elevation in liver enzymes over the last several years but more predominantly in the last 8 to 9 months when checked by primary care at [facility].  AST and ALT have been elevated as has alkaline phosphatase.  Total bilirubin has remained normal. --My suspicion is for fatty liver in the setting of dyslipidemia, hypertension and diabetes.  We discussed fatty liver today.  I recommended that we do additional lab testing to exclude other causes of elevated liver enzymes including genetic and autoimmune disease. --Check CBC, CMP, iron studies, INR, TTG, alpha-1 antitrypsin, mitochondrial antibodies, ANA, IgG, anti-smooth muscle antibody and ceruloplasmin. --Abdominal ultrasound to evaluate liver parenchyma  2.  Colon cancer screening --patient freely admits she has been reluctant to have colonoscopy.  I strongly recommended this for her today based on age.  We discussed the risk, benefits and alternatives and she is agreeable to proceed.  We will schedule this test for her.  3.  Hyperparathyroidism --elevated calcium which very well may affect overall metabolic function.  If glucoses fluctuate due to this condition then fatty liver may as well.  She is followed by endocrinology Dr. [place] Desanctis and would like to consider a second surgical opinion before proceeding with parathyroidectomy.  I will refer her to Dr. Harlow Asa for that second opinion.  Follow-up with me in September  NL:[id number], [address], [place], [place] [id number]   Dr. Melford Aase

## 2018-01-22 LAB — TISSUE TRANSGLUTAMINASE, IGA: (TTG) AB, IGA: 1 U/mL

## 2018-01-22 LAB — IGG: IgG (Immunoglobin G), Serum: 1370 mg/dL (ref 694–1618)

## 2018-01-22 LAB — CERULOPLASMIN: Ceruloplasmin: 37 mg/dL (ref 18–53)

## 2018-01-22 LAB — ANA: ANA: NEGATIVE

## 2018-01-22 LAB — ALPHA-1-ANTITRYPSIN: A-1 Antitrypsin, Ser: 168 mg/dL (ref 83–199)

## 2018-01-22 LAB — ANTI-SMOOTH MUSCLE ANTIBODY, IGG

## 2018-01-22 LAB — MITOCHONDRIAL ANTIBODIES: Mitochondrial M2 Ab, IgG: <=20 U

## 2018-01-24 ENCOUNTER — Ambulatory Visit (HOSPITAL_COMMUNITY)
Admission: RE | Admit: 2018-01-24 | Discharge: 2018-01-24 | Disposition: A | Discharge status: Home / Self Care | Payer: Medicare Other | Source: Ambulatory Visit | Attending: Internal Medicine | Admitting: Internal Medicine

## 2018-01-24 DIAGNOSIS — R945 Abnormal results of liver function studies: Secondary | ICD-10-CM | POA: Diagnosis not present

## 2018-01-24 DIAGNOSIS — R7989 Other specified abnormal findings of blood chemistry: Secondary | ICD-10-CM

## 2018-01-24 DIAGNOSIS — Z9049 Acquired absence of other specified parts of digestive tract: Secondary | ICD-10-CM | POA: Diagnosis not present

## 2018-01-27 NOTE — Progress Notes (Signed)
Patient has been scheduled for an appointment with Dr Harlow Asa on 02/14/18 at 1:30 pm and has also been placed on cancellation list per Sarah at [facility]. Judson Roch states she has also spoken to patient about this.

## 2018-01-27 NOTE — Progress Notes (Signed)
Left voicemail for Emma Munoz at [place] [place] requesting update for appointment for hyperparathyroidism with Dr Harlow Asa.

## 2018-02-17 ENCOUNTER — Other Ambulatory Visit: Payer: Self-pay | Admitting: Surgery

## 2018-02-17 DIAGNOSIS — D351 Benign neoplasm of parathyroid gland: Secondary | ICD-10-CM

## 2018-02-28 ENCOUNTER — Ambulatory Visit
Admission: RE | Admit: 2018-02-28 | Discharge: 2018-02-28 | Disposition: A | Discharge status: Home / Self Care | Payer: Medicare Other | Source: Ambulatory Visit | Attending: Surgery | Admitting: Surgery

## 2018-02-28 DIAGNOSIS — D351 Benign neoplasm of parathyroid gland: Secondary | ICD-10-CM

## 2018-02-28 MED ORDER — IOPAMIDOL (ISOVUE-300) INJECTION 61%
75.0000 mL | Freq: Once | INTRAVENOUS | Status: AC | PRN
  Administered 2018-02-28: 75 mL via INTRAVENOUS

## 2018-03-21 ENCOUNTER — Ambulatory Visit: Payer: Self-pay | Admitting: Surgery

## 2018-04-06 ENCOUNTER — Encounter (HOSPITAL_COMMUNITY): Payer: Self-pay | Admitting: Surgery

## 2018-04-06 NOTE — H&P (Signed)
General Surgery [facility]  [name] DOB: 07/05/1966 Married / Language: English / Race: Black or African American Female   History of Present Illness   The patient is a 52 year old female who presents with primary hyperparathyroidism.  CHIEF COMPLAINT: primary hyperparathyroidism  Patient returns for follow-up of primary hyperparathyroidism. At my request the patient underwent additional diagnostic studies including a 24-hour urine collection for calcium which was elevated at 315. Outside records were reviewed for the nuclear medicine parathyroid scan which was negative. CT scan of the neck with parathyroid protocol was performed on February 28, 2018. This was also negative for evidence of parathyroid adenoma. Patient does have biochemical evidence of primary hyperparathyroidism. She is symptomatic. I think she requires neck exploration and parathyroidectomy. She and her husband come to the office today to discuss this procedure.   Allergies Ansaid *ANALGESICS - ANTI-INFLAMMATORY*  Allergies Reconciled   Medication History Azor (10-40MG  Tablet, Oral) Active. Fosamax (70MG  Tablet, Oral) Active. MetFORMIN HCl (500MG  Tablet, Oral) Active. Flexeril (10MG  Tablet, Oral) Active. Oysco 500 (500MG  Tablet, Oral) Active. Ranitidine 150 Max Strength (150MG  Tablet, Oral) Active. Tapentadol HCl (100MG  Tablet, Oral) Active. Omeprazole (40MG  Capsule DR, Oral) Active. Vitamin D3 (50000UNIT Capsule, Oral) Active. Medications Reconciled  Vitals  Weight: 221 lb Height: 66in Body Surface Area: 2.09 m Body Mass Index: 35.67 kg/m  Temp.: 98.110F(Oral)  Pulse: 88 (Regular)  BP: 140/92 (Sitting, Left Arm, Standard)   Physical Exam  See vital signs recorded above  GENERAL APPEARANCE Development: normal Nutritional status: normal Gross deformities: none  SKIN Rash, lesions, ulcers: none Induration, erythema: none Nodules: none  palpable  EYES Conjunctiva and lids: normal Pupils: equal and reactive Iris: normal bilaterally  EARS, NOSE, MOUTH, THROAT External ears: no lesion or deformity External nose: no lesion or deformity Hearing: grossly normal Lips: no lesion or deformity Dentition: normal for age Oral mucosa: moist  NECK Symmetric: yes Trachea: midline Thyroid: no palpable nodules in the thyroid bed  CHEST Respiratory effort: normal Retraction or accessory muscle use: no Breath sounds: normal bilaterally Rales, rhonchi, wheeze: none  CARDIOVASCULAR Auscultation: regular rhythm, normal rate Murmurs: none Pulses: carotid and radial pulse 2+ palpable Lower extremity edema: none Lower extremity varicosities: none  MUSCULOSKELETAL Station and gait: normal Digits and nails: no clubbing or cyanosis Muscle strength: grossly normal all extremities Range of motion: grossly normal all extremities Deformity: Incision left anterior knee  LYMPHATIC Cervical: none palpable Supraclavicular: none palpable  PSYCHIATRIC Oriented to person, place, and time: yes Mood and affect: normal for situation Judgment and insight: appropriate for situation    Assessment & Plan  PRIMARY HYPERPARATHYROIDISM (E21.0)  Patient presents today to discuss results of testing for primary hyperparathyroidism. She is accompanied by her husband.  Patient has elevated intact PTH level and elevated serum calcium levels. 24-hour urine collection for calcium is elevated. Imaging studies including ultrasound, CT scan, and nuclear medicine parathyroid scan failed to reveal the location of a parathyroid adenoma.  I have recommended proceeding with neck exploration and parathyroidectomy. We discussed the operation. We discussed potential complications including injury to recurrent laryngeal nerves. We discussed the possibility of multiple gland disease. 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 We discussed the hospital stay to be anticipated. We discussed the location of the surgical incision and its postoperative care. We discussed her recuperation at home and return to activities. She understands and wishes to proceed with surgery in the near future.  The risks and benefits of the procedure have been discussed at length with the patient. The patient understands the proposed procedure, potential alternative treatments, and the course of recovery to be expected. All of the patient's questions have been answered at this time. The patient wishes to proceed with surgery.   Emma Munoz, [facility] Office: [phone]

## 2018-04-07 ENCOUNTER — Encounter: Payer: Medicare Other | Admitting: Internal Medicine

## 2018-04-07 NOTE — Progress Notes (Signed)
ekg 08-29-16 epic , 06-04-17 epic care evrywhere

## 2018-04-07 NOTE — Patient Instructions (Addendum)
BELMA DYCHES  04/07/2018   Your procedure is scheduled on: 04-10-18   Report to [facility] Main  Entrance    Report to admitting at 8:30AM    Call this number if you have problems the morning of surgery [phone]      Remember: Do not eat food or drink liquids :After Midnight.     Take these medicines the morning of surgery with A SIP OF WATER: zyrtec, clonidine, metoprolol, omeprazole     ONLY TAKE HALF DOSE OF TRESIBA INSULIN THE MORNING OF SURGERY !                                You may not have any metal on your body including hair pins and              piercings  Do not wear jewelry, make-up, lotions, powders or perfumes, deodorant             Do not wear nail polish.  Do not shave  48 hours prior to surgery.           Do not bring valuables to the hospital. [place].  Contacts, dentures or bridgework may not be worn into surgery.  Leave suitcase in the car. After surgery it may be brought to your room.                Please read over the following fact sheets you were given: _____________________________________________________________________             [facility] - Preparing for Surgery Before surgery, you can play an important role.  Because skin is not sterile, your skin needs to be as free of germs as possible.  You can reduce the number of germs on your skin by washing with CHG (chlorahexidine gluconate) soap before surgery.  CHG is an antiseptic cleaner which kills germs and bonds with the skin to continue killing germs even after washing. Please DO NOT use if you have an allergy to CHG or antibacterial soaps.  If your skin becomes reddened/irritated stop using the CHG and inform your nurse when you arrive at Short Stay. Do not shave (including legs and underarms) for at least 48 hours prior to the first CHG shower.  You may shave your face/neck. Please follow these instructions  carefully:  1.  Shower with CHG Soap the night before surgery and the  morning of Surgery.  2.  If you choose to wash your hair, wash your hair first as usual with your  normal  shampoo.  3.  After you shampoo, rinse your hair and body thoroughly to remove the  shampoo.                           4.  Use CHG as you would any other liquid soap.  You can apply chg directly  to the skin and wash                       Gently with a scrungie or clean washcloth.  5.  Apply the CHG Soap to your body ONLY FROM THE NECK DOWN.   Do not use on  face/ open                           Wound or open sores. Avoid contact with eyes, ears mouth and genitals (private parts).                       Wash face,  Genitals (private parts) with your normal soap.             6.  Wash thoroughly, paying special attention to the area where your surgery  will be performed.  7.  Thoroughly rinse your body with warm water from the neck down.  8.  DO NOT shower/wash with your normal soap after using and rinsing off  the CHG Soap.                9.  Pat yourself dry with a clean towel.            10.  Wear clean pajamas.            11.  Place clean sheets on your bed the night of your first shower and do not  sleep with pets. Day of Surgery : Do not apply any lotions/deodorants the morning of surgery.  Please wear clean clothes to the hospital/surgery center.  FAILURE TO FOLLOW THESE INSTRUCTIONS MAY RESULT IN THE CANCELLATION OF YOUR SURGERY PATIENT SIGNATURE_________________________________  NURSE SIGNATURE__________________________________  ________________________________________________________________________

## 2018-04-08 ENCOUNTER — Other Ambulatory Visit: Payer: Self-pay

## 2018-04-08 ENCOUNTER — Encounter (HOSPITAL_COMMUNITY)
Admission: RE | Admit: 2018-04-08 | Discharge: 2018-04-08 | Disposition: A | Discharge status: Home / Self Care | Payer: Medicare Other | Source: Ambulatory Visit | Attending: Surgery | Admitting: Surgery

## 2018-04-08 ENCOUNTER — Encounter (HOSPITAL_COMMUNITY): Payer: Self-pay

## 2018-04-08 DIAGNOSIS — Z87891 Personal history of nicotine dependence: Secondary | ICD-10-CM | POA: Diagnosis not present

## 2018-04-08 DIAGNOSIS — Z01812 Encounter for preprocedural laboratory examination: Secondary | ICD-10-CM | POA: Insufficient documentation

## 2018-04-08 DIAGNOSIS — E210 Primary hyperparathyroidism: Secondary | ICD-10-CM

## 2018-04-08 DIAGNOSIS — G9050 Complex regional pain syndrome I, unspecified: Secondary | ICD-10-CM | POA: Diagnosis not present

## 2018-04-08 DIAGNOSIS — Z79899 Other long term (current) drug therapy: Secondary | ICD-10-CM | POA: Diagnosis not present

## 2018-04-08 DIAGNOSIS — F329 Major depressive disorder, single episode, unspecified: Secondary | ICD-10-CM | POA: Diagnosis not present

## 2018-04-08 DIAGNOSIS — I10 Essential (primary) hypertension: Secondary | ICD-10-CM | POA: Diagnosis not present

## 2018-04-08 DIAGNOSIS — Z6836 Body mass index (BMI) 36.0-36.9, adult: Secondary | ICD-10-CM | POA: Diagnosis not present

## 2018-04-08 LAB — CBC
HEMATOCRIT: 44.6 % (ref 36.0–46.0)
HEMOGLOBIN: 15.1 g/dL — AB (ref 12.0–15.0)
MCH: 28.1 pg (ref 26.0–34.0)
MCHC: 33.9 g/dL (ref 30.0–36.0)
MCV: 83.1 fL (ref 78.0–100.0)
PLATELETS: 250 10*3/uL (ref 150–400)
RBC: 5.37 MIL/uL — AB (ref 3.87–5.11)
RDW: 13.7 % (ref 11.5–15.5)
WBC: 5.2 10*3/uL (ref 4.0–10.5)

## 2018-04-08 LAB — COMPREHENSIVE METABOLIC PANEL
ALK PHOS: 136 U/L — AB (ref 38–126)
ALT: 138 U/L — AB (ref 0–44)
AST: 115 U/L — AB (ref 15–41)
Albumin: 4.3 g/dL (ref 3.5–5.0)
Anion gap: 4 — ABNORMAL LOW (ref 5–15)
BUN: 7 mg/dL (ref 6–20)
CALCIUM: 11.1 mg/dL — AB (ref 8.9–10.3)
CHLORIDE: 106 mmol/L (ref 98–111)
CO2: 29 mmol/L (ref 22–32)
Creatinine, Ser: 0.65 mg/dL (ref 0.44–1.00)
GFR calc Af Amer: >60 mL/min (ref >60)
Glucose, Bld: 191 mg/dL — ABNORMAL HIGH (ref 70–99)
Potassium: 4.7 mmol/L (ref 3.5–5.1)
Sodium: 139 mmol/L (ref 135–145)
Total Bilirubin: 1 mg/dL (ref 0.3–1.2)
Total Protein: 7.8 g/dL (ref 6.5–8.1)

## 2018-04-08 LAB — HEMOGLOBIN A1C
HEMOGLOBIN A1C: 10.1 % — AB (ref 4.8–5.6)
Mean Plasma Glucose: 243.17 mg/dL

## 2018-04-08 LAB — GLUCOSE, CAPILLARY: Glucose-Capillary: 192 mg/dL — ABNORMAL HIGH (ref 70–99)

## 2018-04-08 NOTE — Progress Notes (Signed)
cmp routed via epic to dr todd gerkin

## 2018-04-08 NOTE — Progress Notes (Signed)
CHART LEFT WITH TAMIKA RN TO F/U

## 2018-04-09 NOTE — Pre-Procedure Instructions (Signed)
HgbA1C 10.1 results faxed to Dr. Harlow Asa via epic.

## 2018-04-09 NOTE — Progress Notes (Signed)
Called patient and reacher her husband, Barbaraann Rondo. Informed him that patient's surgery had time change for 04/10/18. To arrive 0800 for 1000 surgery. NPO after midnight. Medications in AM with sip of water. Shower with CHG tonight and in AM. He verbalizes understanding and states he will inform her.

## 2018-04-10 ENCOUNTER — Other Ambulatory Visit: Payer: Self-pay

## 2018-04-10 ENCOUNTER — Encounter (HOSPITAL_COMMUNITY): Payer: Self-pay | Admitting: *Deleted

## 2018-04-10 ENCOUNTER — Ambulatory Visit (HOSPITAL_COMMUNITY): Payer: Medicare Other | Admitting: Anesthesiology

## 2018-04-10 ENCOUNTER — Observation Stay (HOSPITAL_COMMUNITY)
Admission: RE | Admit: 2018-04-10 | Discharge: 2018-04-11 | Disposition: A | Discharge status: Home / Self Care | Payer: Medicare Other | Source: Ambulatory Visit | Attending: Surgery | Admitting: Surgery

## 2018-04-10 ENCOUNTER — Encounter (HOSPITAL_COMMUNITY)
Admission: RE | Disposition: A | Discharge status: Home / Self Care | Payer: Self-pay | Source: Ambulatory Visit | Attending: Surgery

## 2018-04-10 DIAGNOSIS — Z87891 Personal history of nicotine dependence: Secondary | ICD-10-CM | POA: Insufficient documentation

## 2018-04-10 DIAGNOSIS — E210 Primary hyperparathyroidism: Principal | ICD-10-CM | POA: Diagnosis present

## 2018-04-10 DIAGNOSIS — F329 Major depressive disorder, single episode, unspecified: Secondary | ICD-10-CM | POA: Insufficient documentation

## 2018-04-10 DIAGNOSIS — Z79899 Other long term (current) drug therapy: Secondary | ICD-10-CM | POA: Insufficient documentation

## 2018-04-10 DIAGNOSIS — I10 Essential (primary) hypertension: Secondary | ICD-10-CM | POA: Diagnosis not present

## 2018-04-10 DIAGNOSIS — G9050 Complex regional pain syndrome I, unspecified: Secondary | ICD-10-CM | POA: Insufficient documentation

## 2018-04-10 DIAGNOSIS — Z6836 Body mass index (BMI) 36.0-36.9, adult: Secondary | ICD-10-CM | POA: Insufficient documentation

## 2018-04-10 HISTORY — PX: PARATHYROIDECTOMY: SHX19

## 2018-04-10 LAB — GLUCOSE, CAPILLARY: Glucose-Capillary: 207 mg/dL — ABNORMAL HIGH (ref 70–99)

## 2018-04-10 SURGERY — PARATHYROIDECTOMY
Anesthesia: General | Site: Neck

## 2018-04-10 MED ORDER — CHLORHEXIDINE GLUCONATE CLOTH 2 % EX PADS: 6.0000 | MEDICATED_PAD | Freq: Once | CUTANEOUS | Status: DC

## 2018-04-10 MED ORDER — AMLODIPINE BESYLATE 10 MG PO TABS
10.0000 mg | ORAL_TABLET | Freq: Every day | ORAL | Status: DC
  Administered 2018-04-11: 10 mg via ORAL
  Filled 2018-04-10: qty 1

## 2018-04-10 MED ORDER — BUPIVACAINE HCL 0.25 % IJ SOLN
INTRAMUSCULAR | Status: DC | PRN
  Administered 2018-04-10: 10 mL

## 2018-04-10 MED ORDER — AMLODIPINE-OLMESARTAN 10-40 MG PO TABS: 1.0000 | ORAL_TABLET | Freq: Every day | ORAL | Status: DC

## 2018-04-10 MED ORDER — OXYCODONE HCL 5 MG/5ML PO SOLN
5.0000 mg | Freq: Once | ORAL | Status: DC | PRN
  Filled 2018-04-10: qty 5

## 2018-04-10 MED ORDER — MORPHINE SULFATE ER 30 MG PO TBCR
30.0000 mg | EXTENDED_RELEASE_TABLET | Freq: Two times a day (BID) | ORAL | Status: DC
  Administered 2018-04-10 – 2018-04-11 (×2): 30 mg via ORAL
  Filled 2018-04-10 (×3): qty 1

## 2018-04-10 MED ORDER — PROPOFOL 10 MG/ML IV BOLUS
INTRAVENOUS | Status: DC | PRN
  Administered 2018-04-10: 10 mg via INTRAVENOUS

## 2018-04-10 MED ORDER — LACTATED RINGERS IV SOLN
INTRAVENOUS | Status: DC
  Administered 2018-04-10 (×2): via INTRAVENOUS

## 2018-04-10 MED ORDER — IRBESARTAN 300 MG PO TABS
300.0000 mg | ORAL_TABLET | Freq: Every day | ORAL | Status: DC
  Administered 2018-04-11: 300 mg via ORAL
  Filled 2018-04-10: qty 1

## 2018-04-10 MED ORDER — PROMETHAZINE HCL 25 MG/ML IJ SOLN
12.5000 mg | Freq: Four times a day (QID) | INTRAMUSCULAR | Status: DC | PRN
  Administered 2018-04-10 – 2018-04-11 (×2): 12.5 mg via INTRAVENOUS
  Filled 2018-04-10 (×2): qty 1

## 2018-04-10 MED ORDER — FENTANYL CITRATE (PF) 100 MCG/2ML IJ SOLN
INTRAMUSCULAR | Status: AC
  Filled 2018-04-10: qty 2

## 2018-04-10 MED ORDER — LIDOCAINE 2% (20 MG/ML) 5 ML SYRINGE
INTRAMUSCULAR | Status: DC | PRN
  Administered 2018-04-10: 100 mg via INTRAVENOUS

## 2018-04-10 MED ORDER — LIDOCAINE 2% (20 MG/ML) 5 ML SYRINGE
INTRAMUSCULAR | Status: AC
  Filled 2018-04-10: qty 5

## 2018-04-10 MED ORDER — MORPHINE SULFATE (PF) 2 MG/ML IV SOLN
1.0000 mg | INTRAVENOUS | Status: DC | PRN
  Administered 2018-04-10 (×2): 2 mg via INTRAVENOUS
  Filled 2018-04-10 (×2): qty 1

## 2018-04-10 MED ORDER — AMLODIPINE BESYLATE 10 MG PO TABS
10.0000 mg | ORAL_TABLET | Freq: Once | ORAL | Status: AC
  Administered 2018-04-10: 10 mg via ORAL
  Filled 2018-04-10: qty 1

## 2018-04-10 MED ORDER — HYDRALAZINE HCL 20 MG/ML IJ SOLN
10.0000 mg | Freq: Once | INTRAMUSCULAR | Status: AC
  Administered 2018-04-10: 10 mg via INTRAVENOUS

## 2018-04-10 MED ORDER — METOPROLOL SUCCINATE ER 50 MG PO TB24
50.0000 mg | ORAL_TABLET | Freq: Every day | ORAL | Status: DC
  Administered 2018-04-11: 50 mg via ORAL
  Filled 2018-04-10: qty 1

## 2018-04-10 MED ORDER — HYDRALAZINE HCL 20 MG/ML IJ SOLN
INTRAMUSCULAR | Status: AC
  Filled 2018-04-10: qty 1

## 2018-04-10 MED ORDER — MIDAZOLAM HCL 2 MG/2ML IJ SOLN
INTRAMUSCULAR | Status: AC
  Filled 2018-04-10: qty 2

## 2018-04-10 MED ORDER — ROCURONIUM BROMIDE 10 MG/ML (PF) SYRINGE
PREFILLED_SYRINGE | INTRAVENOUS | Status: DC | PRN
  Administered 2018-04-10: 50 mg via INTRAVENOUS
  Administered 2018-04-10 (×2): 10 mg via INTRAVENOUS

## 2018-04-10 MED ORDER — ONDANSETRON HCL 4 MG/2ML IJ SOLN
INTRAMUSCULAR | Status: DC | PRN
  Administered 2018-04-10: 4 mg via INTRAVENOUS

## 2018-04-10 MED ORDER — KCL IN DEXTROSE-NACL 20-5-0.45 MEQ/L-%-% IV SOLN
INTRAVENOUS | Status: DC
  Administered 2018-04-10: 14:00:00 via INTRAVENOUS
  Filled 2018-04-10: qty 1000

## 2018-04-10 MED ORDER — SUGAMMADEX SODIUM 200 MG/2ML IV SOLN
INTRAVENOUS | Status: DC | PRN
  Administered 2018-04-10: 200 mg via INTRAVENOUS

## 2018-04-10 MED ORDER — ROCURONIUM BROMIDE 100 MG/10ML IV SOLN
INTRAVENOUS | Status: AC
  Filled 2018-04-10: qty 1

## 2018-04-10 MED ORDER — BUPIVACAINE HCL (PF) 0.25 % IJ SOLN
INTRAMUSCULAR | Status: AC
  Filled 2018-04-10: qty 30

## 2018-04-10 MED ORDER — PANTOPRAZOLE SODIUM 40 MG PO TBEC
40.0000 mg | DELAYED_RELEASE_TABLET | Freq: Every day | ORAL | Status: DC
  Administered 2018-04-11: 40 mg via ORAL
  Filled 2018-04-10: qty 1

## 2018-04-10 MED ORDER — SUGAMMADEX SODIUM 200 MG/2ML IV SOLN
INTRAVENOUS | Status: AC
  Filled 2018-04-10: qty 2

## 2018-04-10 MED ORDER — FENTANYL CITRATE (PF) 100 MCG/2ML IJ SOLN
INTRAMUSCULAR | Status: DC | PRN
  Administered 2018-04-10 (×5): 50 ug via INTRAVENOUS

## 2018-04-10 MED ORDER — MIDAZOLAM HCL 5 MG/5ML IJ SOLN
INTRAMUSCULAR | Status: DC | PRN
  Administered 2018-04-10: 2 mg via INTRAVENOUS

## 2018-04-10 MED ORDER — ONDANSETRON 4 MG PO TBDP: 4.0000 mg | ORAL_TABLET | Freq: Four times a day (QID) | ORAL | Status: DC | PRN

## 2018-04-10 MED ORDER — DEXAMETHASONE SODIUM PHOSPHATE 10 MG/ML IJ SOLN
INTRAMUSCULAR | Status: DC | PRN
  Administered 2018-04-10: 10 mg via INTRAVENOUS

## 2018-04-10 MED ORDER — CEFAZOLIN SODIUM-DEXTROSE 2-4 GM/100ML-% IV SOLN
2.0000 g | INTRAVENOUS | Status: AC
  Administered 2018-04-10: 2 g via INTRAVENOUS
  Filled 2018-04-10: qty 100

## 2018-04-10 MED ORDER — OXYCODONE HCL 5 MG PO TABS: 5.0000 mg | ORAL_TABLET | Freq: Once | ORAL | Status: DC | PRN

## 2018-04-10 MED ORDER — ONDANSETRON HCL 4 MG/2ML IJ SOLN
4.0000 mg | Freq: Four times a day (QID) | INTRAMUSCULAR | Status: DC | PRN
  Administered 2018-04-10 (×2): 4 mg via INTRAVENOUS
  Filled 2018-04-10 (×2): qty 2

## 2018-04-10 MED ORDER — FENTANYL CITRATE (PF) 100 MCG/2ML IJ SOLN
25.0000 ug | INTRAMUSCULAR | Status: DC | PRN
  Administered 2018-04-10 (×3): 50 ug via INTRAVENOUS

## 2018-04-10 MED ORDER — CLONIDINE HCL 0.1 MG PO TABS
0.1000 mg | ORAL_TABLET | Freq: Two times a day (BID) | ORAL | Status: DC
  Administered 2018-04-11 (×2): 0.1 mg via ORAL
  Filled 2018-04-10 (×3): qty 1

## 2018-04-10 MED ORDER — ACETAMINOPHEN 325 MG PO TABS
650.0000 mg | ORAL_TABLET | Freq: Four times a day (QID) | ORAL | Status: DC | PRN
  Administered 2018-04-10 – 2018-04-11 (×2): 650 mg via ORAL
  Filled 2018-04-10 (×2): qty 2

## 2018-04-10 MED ORDER — TRAMADOL HCL 50 MG PO TABS
50.0000 mg | ORAL_TABLET | Freq: Four times a day (QID) | ORAL | Status: DC | PRN
  Administered 2018-04-10: 50 mg via ORAL
  Filled 2018-04-10 (×2): qty 1

## 2018-04-10 MED ORDER — 0.9 % SODIUM CHLORIDE (POUR BTL) OPTIME
TOPICAL | Status: DC | PRN
  Administered 2018-04-10: 1000 mL

## 2018-04-10 MED ORDER — ACETAMINOPHEN 650 MG RE SUPP: 650.0000 mg | Freq: Four times a day (QID) | RECTAL | Status: DC | PRN

## 2018-04-10 MED ORDER — PROPOFOL 10 MG/ML IV BOLUS
INTRAVENOUS | Status: AC
  Filled 2018-04-10: qty 20

## 2018-04-10 MED ORDER — CYCLOBENZAPRINE HCL 10 MG PO TABS
20.0000 mg | ORAL_TABLET | Freq: Every day | ORAL | Status: DC
  Administered 2018-04-11: 20 mg via ORAL
  Filled 2018-04-10 (×2): qty 2

## 2018-04-10 SURGICAL SUPPLY — 38 items
ATTRACTOMAT 16X20 MAGNETIC DRP (DRAPES) ×3 IMPLANT
BLADE SURG 15 STRL LF DISP TIS (BLADE) ×1 IMPLANT
BLADE SURG 15 STRL SS (BLADE) ×2
CHLORAPREP W/TINT 26ML (MISCELLANEOUS) ×3 IMPLANT
CLIP VESOCCLUDE MED 6/CT (CLIP) ×6 IMPLANT
CLIP VESOCCLUDE SM WIDE 6/CT (CLIP) ×6 IMPLANT
CLOSURE WOUND 1/2 X4 (GAUZE/BANDAGES/DRESSINGS) ×1
COVER SURGICAL LIGHT HANDLE (MISCELLANEOUS) ×3 IMPLANT
DRAPE LAPAROTOMY T 98X78 PEDS (DRAPES) ×3 IMPLANT
ELECT PENCIL ROCKER SW 15FT (MISCELLANEOUS) ×3 IMPLANT
ELECT REM PT RETURN 15FT ADLT (MISCELLANEOUS) ×3 IMPLANT
GAUZE 4X4 16PLY RFD (DISPOSABLE) ×6 IMPLANT
GAUZE SPONGE 4X4 12PLY STRL (GAUZE/BANDAGES/DRESSINGS) ×3 IMPLANT
GLOVE BIOGEL PI IND STRL 7.0 (GLOVE) ×4 IMPLANT
GLOVE BIOGEL PI IND STRL 7.5 (GLOVE) ×1 IMPLANT
GLOVE BIOGEL PI INDICATOR 7.0 (GLOVE) ×8
GLOVE BIOGEL PI INDICATOR 7.5 (GLOVE) ×2
GLOVE SURG SS PI 8.0 STRL IVOR (GLOVE) ×3 IMPLANT
GOWN STRL REUS W/TWL LRG LVL3 (GOWN DISPOSABLE) ×3 IMPLANT
GOWN STRL REUS W/TWL XL LVL3 (GOWN DISPOSABLE) ×12 IMPLANT
HEMOSTAT SURGICEL 2X4 FIBR (HEMOSTASIS) ×3 IMPLANT
ILLUMINATOR WAVEGUIDE N/F (MISCELLANEOUS) IMPLANT
KIT BASIN OR (CUSTOM PROCEDURE TRAY) ×3 IMPLANT
LIGHT WAVEGUIDE WIDE FLAT (MISCELLANEOUS) IMPLANT
NEEDLE HYPO 25X1 1.5 SAFETY (NEEDLE) ×3 IMPLANT
PACK BASIC VI WITH GOWN DISP (CUSTOM PROCEDURE TRAY) ×3 IMPLANT
POWDER SURGICEL 3.0 GRAM (HEMOSTASIS) IMPLANT
STRIP CLOSURE SKIN 1/2X4 (GAUZE/BANDAGES/DRESSINGS) ×2 IMPLANT
SUT MNCRL AB 4-0 PS2 18 (SUTURE) ×3 IMPLANT
SUT SILK 2 0 (SUTURE) ×2
SUT SILK 2-0 18XBRD TIE 12 (SUTURE) ×1 IMPLANT
SUT VIC AB 3-0 SH 18 (SUTURE) ×9 IMPLANT
SYR BULB IRRIGATION 50ML (SYRINGE) ×3 IMPLANT
SYR CONTROL 10ML LL (SYRINGE) ×3 IMPLANT
TAPE CLOTH SURG 4X10 WHT LF (GAUZE/BANDAGES/DRESSINGS) ×3 IMPLANT
TOWEL OR 17X26 10 PK STRL BLUE (TOWEL DISPOSABLE) ×3 IMPLANT
TOWEL OR NON WOVEN STRL DISP B (DISPOSABLE) ×3 IMPLANT
YANKAUER SUCT BULB TIP 10FT TU (MISCELLANEOUS) ×3 IMPLANT

## 2018-04-10 NOTE — Plan of Care (Signed)
Pt remains stable. RN medicating for needs. No changes to care plans needed. Will continue to monitor.

## 2018-04-10 NOTE — Op Note (Signed)
Operative Note  Pre-operative Diagnosis:  Primary hyperparathyroidism  Post-operative Diagnosis:  same  Surgeon:  Armandina Gemma, Emma Munoz  Assistant:  none   Procedure:  Neck exploration, left superior parathyroidectomy  Anesthesia:  general  Estimated Blood Loss:  minimal  Drains: none         Specimen: to pathology  Indications:  Patient returns for follow-up of primary hyperparathyroidism. At my request the patient underwent additional diagnostic studies including a 24-hour urine collection for calcium which was elevated at 315. Outside records were reviewed for the nuclear medicine parathyroid scan which was negative. CT scan of the neck with parathyroid protocol was performed on February 28, 2018. This was also negative for evidence of parathyroid adenoma. Patient does have biochemical evidence of primary hyperparathyroidism. She is symptomatic. I think she requires neck exploration and parathyroidectomy.   Procedure Details:  The patient was seen in the pre-op holding area. The risks, benefits, complications, treatment options, and expected outcomes were previously discussed with the patient. The patient agreed with the proposed plan and has signed the informed consent form.  The patient was brought to the operating room by the surgical team, identified as Emma Munoz and the procedure verified. A "time out" was completed and the above information confirmed.  Following administration of general anesthesia, the patient was positioned and then prepped and draped in the usual aseptic fashion.  After ascertaining that an adequate level of anesthesia been achieved, a Kocher incision was made with a #15 blade.  Dissection was carried through the subcutaneous tissues and platysma.  Hemostasis was obtained with the electrocautery and with 3-0 Vicryl suture ligatures.  Skin flaps were developed cephalad and caudad from the thyroid notch to the sternal notch.  A Mahorner self-retaining retractor  was placed for exposure.  Strap muscles were incised in the midline.  Dissection was begun on the left side.  Strap muscles were reflected laterally.  Left thyroid lobe was mildly enlarged but without dominant or discrete nodules.  Exploration on the left side revealed a normal appearing parathyroid gland in the proximal portion of the left thyro-thymic tract.  Further dissection on the left side provided for identification of a left superior parathyroid gland which appeared mildly enlarged and slightly nodular.  Dry pack was placed in the left neck.  Next we reflected strap muscles to the right.  Right thyroid lobe was also mildly enlarged but without dominant nodules.  Exploration on the right side allowed for identification of what appeared to be a normal right superior parathyroid gland.  This became slightly bruised during the course of the dissection.  Further dissection on the right failed to identify a right inferior parathyroid gland.  Right thyroid thymic tract was completely dissected out and excised.  No obvious parathyroid tissue was present.  The entire tract was submitted to pathology for permanent review.  Further exploration was carried out on the right back to the precervical fascia.  Carotid sheath was inspected.  Retroesophageal space was inspected.  No evidence of enlarged parathyroid tissue was identified.  Next we turned our attention back to the left side.  Decision was made to proceed with left superior parathyroidectomy as this was the most likely candidate for parathyroid adenoma.  This gland was resected and submitted to pathology.  Frozen section confirmed parathyroid tissue possibly consistent with adenoma.  The left inferior gland was biopsied using a medium Ligaclip.  Frozen section did confirm parathyroid tissue.  Next we went back to the right side.  A candidate for a left inferior gland was identified and was biopsied with a medium Ligaclip.  Frozen section showed lymphoid  tissue consistent with a small lymph node.  The right superior parathyroid gland was convincing anatomically for a small normal right superior gland.  Biopsy was performed with a medium Ligaclip but on frozen section parathyroid tissue could not be identified positively.  Pathology would like to defer to permanent sectioning.  After a thorough inspection of the neck bilaterally, the retroesophageal space bilaterally, and the carotid sheath bilaterally, a decision is made to conclude the operation and follow clinically with laboratory studies.  Neck is irrigated with warm saline and good hemostasis is noted bilaterally.  Fibrillar was placed throughout the operative field.  Strap muscles are reapproximated in the midline with interrupted 3-0 Vicryl sutures.  Platysma was closed with interrupted 3-0 Vicryl sutures.  Skin is anesthetized with local anesthetic.  Skin is closed with a running 4-0 Monocryl subcuticular suture.  Wound is washed and dried and Steri-Strips are applied.  Sterile dressings are applied.  Patient is awakened from anesthesia and brought to the recovery room.  The patient tolerated the procedure well.   Armandina Gemma, Emma Munoz [facility] Office: [phone]

## 2018-04-10 NOTE — Anesthesia Preprocedure Evaluation (Signed)
Anesthesia Evaluation  Patient identified by MRN, date of birth, ID band Patient awake    Reviewed: Allergy & Precautions, H&P , NPO status , Patient's Chart, lab work & pertinent test results, reviewed documented beta blocker date and time   History of Anesthesia Complications Negative for: history of anesthetic complications  Airway Mallampati: II  TM Distance: >3 FB Neck ROM: Full    Dental  (+) Dental Advisory Given, Teeth Intact   Pulmonary Current Smoker, former smoker,    Pulmonary exam normal breath sounds clear to auscultation       Cardiovascular hypertension, Pt. on home beta blockers and Pt. on medications Normal cardiovascular exam+ Valvular Problems/Murmurs  Rhythm:Regular Rate:Normal     Neuro/Psych  Headaches, PSYCHIATRIC DISORDERS Depression  Neuromuscular disease    GI/Hepatic Neg liver ROS, GERD  Medicated and Controlled,N/v with bowel obstruction   Endo/Other  Morbid obesity  Renal/GU      Musculoskeletal   Abdominal (+) + obese,   Peds  Hematology  (+) anemia ,   Anesthesia Other Findings RSD/ complex pain syndrome   Left ventricle: The cavity size was normal. Wall thickness was increased in a pattern of mild LVH. Systolic function was normal. The estimated ejection fraction was in the range of 55% to 65%. Wall motion was normal; there were no regional wall motion abnormalities. Left ventricular diastolic function parameters were normal. - Aortic valve: Mild regurgitation. - Mitral valve: Mild regurgitation. - Pulmonary arteries: Systolic pressure was within the normal range.  Reproductive/Obstetrics                             Anesthesia Physical Anesthesia Plan  ASA: III  Anesthesia Plan: General   Post-op Pain Management:    Induction: Intravenous  PONV Risk Score and Plan: 3 and Ondansetron and Dexamethasone  Airway Management Planned:  Oral ETT  Additional Equipment: None  Intra-op Plan:   Post-operative Plan: Extubation in OR  Informed Consent: I have reviewed the patients History and Physical, chart, labs and discussed the procedure including the risks, benefits and alternatives for the proposed anesthesia with the patient or authorized representative who has indicated his/her understanding and acceptance.   Dental advisory given  Plan Discussed with: CRNA and Surgeon  Anesthesia Plan Comments:         Anesthesia Quick Evaluation

## 2018-04-10 NOTE — Transfer of Care (Signed)
Immediate Anesthesia Transfer of Care Note  Patient: Emma Munoz  Procedure(s) Performed: PARATHYROIDECTOMY WITH NECK EXPLORATION (N/A Neck)  Patient Location: PACU  Anesthesia Type:General  Level of Consciousness: sedated  Airway & Oxygen Therapy: Patient Spontanous Breathing and Patient connected to face mask oxygen  Post-op Assessment: Report given to RN and Post -op Vital signs reviewed and stable  Post vital signs: Reviewed and stable  Last Vitals:  Vitals Value Taken Time  BP    Temp    Pulse    Resp    SpO2      Last Pain:  Vitals:   04/10/18 0815  TempSrc: Oral         Complications: No apparent anesthesia complications

## 2018-04-10 NOTE — Anesthesia Procedure Notes (Addendum)
Procedure Name: Intubation Date/Time: 04/10/2018 9:59 AM Performed by: Lind Covert, CRNA Pre-anesthesia Checklist: Patient identified, Emergency Drugs available, Suction available, Patient being monitored and Timeout performed Patient Re-evaluated:Patient Re-evaluated prior to induction Oxygen Delivery Method: Circle system utilized Preoxygenation: Pre-oxygenation with 100% oxygen Induction Type: IV induction Ventilation: Mask ventilation without difficulty Laryngoscope Size: Mac, 4 and Glidescope Grade View: Grade III Tube type: Oral Number of attempts: 4 (esophageal intubation 1st attempt grade 3 view with mac 4 blade, Dr. Ermalene Postin attempted with Sabra Heck 2, Sabra Heck 3, Moved to glidescope Mac 4) Placement Confirmation: ETT inserted through vocal cords under direct vision,  positive ETCO2 and breath sounds checked- equal and bilateral Secured at: 22 cm Tube secured with: Tape Dental Injury: Teeth and Oropharynx as per pre-operative assessment  Difficulty Due To: Difficulty was unanticipated, Difficult Airway- due to large tongue, Difficult Airway- due to reduced neck mobility and Difficult Airway- due to anterior larynx Future Recommendations: Recommend- induction with short-acting agent, and alternative techniques readily available Comments: Patient easy to mask ventilate. Patient with anterior larynx, large tongue, and reduced neck mobility.

## 2018-04-10 NOTE — Interval H&P Note (Signed)
History and Physical Interval Note:  04/10/2018 9:13 AM  [name]  has presented today for surgery, with the diagnosis of HYPERPARATHYROIDISM.  The various methods of treatment have been discussed with the patient and family. After consideration of risks, benefits and other options for treatment, the patient has consented to    Procedure(s): PARATHYROIDECTOMY WITH NECK EXPLORATION (N/A) as a surgical intervention .    The patient's history has been reviewed, patient examined, no change in status, stable for surgery.  I have reviewed the patient's chart and labs.  Questions were answered to the patient's satisfaction.    Emma Munoz, [facility] Office: [place]

## 2018-04-10 NOTE — Progress Notes (Signed)
Patient refuses to ambulate in the hall at present time, but states she will "after I talk a small nap". Education done regarding prevention of pneumonia and blood clots after surgery; patient understands risk and is willing to ambulate in hall after nap. She has ambulated in room to bathroom and to chair already.  Donne Hazel, RN

## 2018-04-11 ENCOUNTER — Encounter (HOSPITAL_COMMUNITY): Payer: Self-pay | Admitting: Surgery

## 2018-04-11 DIAGNOSIS — E210 Primary hyperparathyroidism: Secondary | ICD-10-CM | POA: Diagnosis not present

## 2018-04-11 LAB — BASIC METABOLIC PANEL
Anion gap: 12 (ref 5–15)
BUN: 11 mg/dL (ref 6–20)
CHLORIDE: 99 mmol/L (ref 98–111)
CO2: 25 mmol/L (ref 22–32)
CREATININE: 0.69 mg/dL (ref 0.44–1.00)
Calcium: 10.5 mg/dL — ABNORMAL HIGH (ref 8.9–10.3)
GFR calc Af Amer: >60 mL/min (ref >60)
Glucose, Bld: 281 mg/dL — ABNORMAL HIGH (ref 70–99)
Potassium: 4.4 mmol/L (ref 3.5–5.1)
SODIUM: 136 mmol/L (ref 135–145)

## 2018-04-11 NOTE — Progress Notes (Signed)
Discharge paperwork discussed with patient and daughter at the bedside.  They demonstrated understanding and pt was escorted to main lobby by wheelchair in stable condition.

## 2018-04-11 NOTE — Discharge Summary (Signed)
Physician Discharge Summary [facility]  Patient ID: Emma Munoz MRN: [id number] DOB/AGE: 1966/05/04 52 y.o.  Admit date: 04/10/2018 Discharge date: 04/11/2018  Admission Diagnoses:  Primary hyperparathyroidism  Discharge Diagnoses:  Principal Problem:   Primary hyperparathyroidism [facility])   Discharged Condition: good  Hospital Course: Patient was admitted for observation following parathyroid surgery.  Post op course was uncomplicated.  Pain was well controlled.  Tolerated diet.  Post op calcium level on morning following surgery was 10.5 mg/dl.  Patient was prepared for discharge home on POD#1.  Consults: None  Treatments: surgery: neck exploration and parathyroidectomy  Discharge Exam: Blood pressure (!) 172/104, pulse 71, temperature 98.9 F (37.2 C), temperature source Oral, resp. rate 18, height '5\' 6"'$  (1.676 m), weight 102.1 kg, SpO2 98 %. HEENT - clear Neck - wound dry and intact; mild STS; voice slight hoarseness Chest - clear bilaterally Cor - RRR  Disposition: Home  Discharge Instructions    Diet - low sodium heart healthy   Complete by:  As directed    Discharge instructions   Complete by:  As directed    [facility]  THYROID & PARATHYROID SURGERY:  POST-OP INSTRUCTIONS  Always review your discharge instruction sheet from the facility where your surgery was performed.  A prescription for pain medication may be given to you upon discharge.  Take your pain medication as prescribed.  If narcotic pain medicine is not needed, then you may take acetaminophen (Tylenol) or ibuprofen (Advil) as needed.  Take your usually prescribed medications unless otherwise directed.  If you need a refill on your pain medication, please contact our office during regular business hours.  Prescriptions cannot be processed by our office after 5 pm or on weekends.  Start with a light diet upon arrival home, such as soup and crackers or  toast.  Be sure to drink plenty of fluids daily.  Resume your normal diet the day after surgery.  Most patients will experience some swelling and bruising on the chest and neck area.  Ice packs will help.  Swelling and bruising can take several days to resolve.   It is common to experience some constipation after surgery.  Increasing fluid intake and taking a stool softener (Colace) will usually help or prevent this problem.  A mild laxative (Milk of Magnesia or Miralax) should be taken according to package directions if there has been no bowel movement after 48 hours.  You have steri-strips and a gauze dressing over your incision.  You may remove the gauze bandage on the second day after surgery, and you may shower at that time.  Leave your steri-strips (small skin tapes) in place directly over the incision.  These strips should remain on the skin for 5-7 days and then be removed.  You may get them wet in the shower and pat them dry.  You may resume regular (light) daily activities beginning the next day (such as daily self-care, walking, climbing stairs) gradually increasing activities as tolerated.  You may have sexual intercourse when it is comfortable.  Refrain from any heavy lifting or straining until approved by your doctor.  You may drive when you no longer are taking prescription pain medication, you can comfortably wear a seatbelt, and you can safely maneuver your car and apply brakes.  You should see your doctor in the office for a follow-up appointment approximately three weeks after your surgery.  Make sure that you call for this appointment within a  day or two after you arrive home to insure a convenient appointment time.  WHEN TO CALL YOUR DOCTOR: -- Fever greater than 101.5 -- Inability to urinate -- Nausea and/or vomiting - persistent -- Extreme swelling or bruising -- Continued bleeding from incision -- Increased pain, redness, or drainage from the incision -- Difficulty  swallowing or breathing -- Muscle cramping or spasms -- Numbness or tingling in hands or around lips  The clinic staff is available to answer your questions during regular business hours.  Please don't hesitate to call and ask to speak to one of the nurses if you have concerns.  Armandina Gemma, MD [facility] Office: [phone]   Increase activity slowly   Complete by:  As directed    Remove dressing in 24 hours   Complete by:  As directed      Allergies as of 04/11/2018      Reactions   Aspirin Anaphylaxis, Other (See Comments)   REACTION: Tongue, throat, extremities swell   Dilaudid [hydromorphone Hcl] Nausea And Vomiting   Ibuprofen Anaphylaxis, Other (See Comments)   REACTION: Tongue, throat, extremities swell   Latex Hives   Nsaids Anaphylaxis, Other (See Comments)   REACTION: Tongue, throat, extremities swell   Gadolinium Derivatives Nausea And Vomiting, Other (See Comments)   Pt states nausea and vomiting to GAD in MRI pt states she does ok with iv/cm in cat scan   Statins Other (See Comments)   fatigue   Metformin And Related Nausea And Vomiting      Medication List    TAKE these medications   alendronate 70 MG tablet Commonly known as:  FOSAMAX Take 70 mg by mouth every Sunday.   amLODipine-olmesartan 10-40 MG tablet Commonly known as:  AZOR Take 1 tablet by mouth daily.   cetirizine 10 MG tablet Commonly known as:  ZYRTEC Take 10 mg by mouth daily.   cloNIDine 0.1 MG tablet Commonly known as:  CATAPRES Take 0.1 mg by mouth 2 (two) times daily.   cyclobenzaprine 10 MG tablet Commonly known as:  FLEXERIL Take 20 mg by mouth at bedtime.   hydrOXYzine 10 MG tablet Commonly known as:  ATARAX/VISTARIL Take 10 mg by mouth 3 (three) times daily as needed (hives/itching).   metoprolol succinate 50 MG 24 hr tablet Commonly known as:  TOPROL-XL Take 50-150 mg by mouth daily. Patient varies dose based on blood pressure readings   morphine  30 MG 12 hr tablet Commonly known as:  MS CONTIN Take 30 mg by mouth 2 (two) times daily.   morphine 30 MG tablet Commonly known as:  MSIR Take 30 mg by mouth 3 (three) times daily as needed (for breakthrough pain.).   omeprazole 40 MG capsule Commonly known as:  PRILOSEC Take 1 capsule (40 mg total) by mouth daily.   OYSCO 500 500 MG Tabs Generic drug:  Oyster Shell Calcium Take 500 mg by mouth daily.   ranitidine 150 MG capsule Commonly known as:  ZANTAC Take 150 mg by mouth 2 (two) times daily as needed (hives).   SUPREP BOWEL PREP KIT 17.5-3.13-1.6 GM/177ML Soln Generic drug:  Na Sulfate-K Sulfate-Mg Sulf Take 1 kit by mouth as directed. For colonoscopy prep   TRESIBA FLEXTOUCH 200 UNIT/ML Sopn Generic drug:  Insulin Degludec Inject 50 Units into the skin daily.   Vitamin D (Ergocalciferol) 50000 units Caps capsule Commonly known as:  DRISDOL Take 50,000 Units by mouth every other day.        Sherren Mocha  Leeanne Mannan, MD, [facility] Office: [phone]   Signed: Earnstine Regal 04/11/2018, 8:18 AM

## 2018-04-11 NOTE — Plan of Care (Signed)
Pt alert and oriented, doing well this am.  Pain controlled with PO morphine, pt OOB to chair and discharged home per MD order.

## 2018-04-12 NOTE — Anesthesia Postprocedure Evaluation (Signed)
Anesthesia Post Note  Patient: Emma Munoz  Procedure(s) Performed: PARATHYROIDECTOMY WITH NECK EXPLORATION (N/A Neck)     Patient location during evaluation: PACU Anesthesia Type: General Level of consciousness: awake and alert Pain management: pain level controlled Vital Signs Assessment: post-procedure vital signs reviewed and stable Respiratory status: spontaneous breathing, nonlabored ventilation, respiratory function stable and patient connected to nasal cannula oxygen Cardiovascular status: blood pressure returned to baseline and stable Postop Assessment: no apparent nausea or vomiting Anesthetic complications: no    Last Vitals:  Vitals:   04/11/18 0640 04/11/18 1003  BP: (!) 172/104 (!) 147/79  Pulse: 71 69  Resp: 18 18  Temp: 37.2 C 37.3 C  SpO2: 98% 100%    Last Pain:  Vitals:   04/11/18 1020  TempSrc:   PainSc: Asleep                 Inigo Lantigua

## 2019-04-22 ENCOUNTER — Encounter: Payer: Self-pay | Admitting: Neurology

## 2019-04-22 ENCOUNTER — Ambulatory Visit (INDEPENDENT_AMBULATORY_CARE_PROVIDER_SITE_OTHER): Payer: Medicare Other | Admitting: Neurology

## 2019-04-22 ENCOUNTER — Other Ambulatory Visit: Payer: Self-pay

## 2019-04-22 VITALS — BP 150/113 | HR 93 | Temp 96.6°F | Ht 66.0 in | Wt 231.0 lb

## 2019-04-22 DIAGNOSIS — R258 Other abnormal involuntary movements: Secondary | ICD-10-CM | POA: Diagnosis not present

## 2019-04-22 DIAGNOSIS — G1221 Amyotrophic lateral sclerosis: Secondary | ICD-10-CM

## 2019-04-22 DIAGNOSIS — G1229 Other motor neuron disease: Secondary | ICD-10-CM

## 2019-04-22 DIAGNOSIS — M4802 Spinal stenosis, cervical region: Secondary | ICD-10-CM

## 2019-04-22 DIAGNOSIS — R270 Ataxia, unspecified: Secondary | ICD-10-CM

## 2019-04-22 DIAGNOSIS — G992 Myelopathy in diseases classified elsewhere: Secondary | ICD-10-CM

## 2019-04-22 DIAGNOSIS — R292 Abnormal reflex: Secondary | ICD-10-CM

## 2019-04-22 DIAGNOSIS — G3281 Cerebellar ataxia in diseases classified elsewhere: Secondary | ICD-10-CM

## 2019-04-22 DIAGNOSIS — W19XXXA Unspecified fall, initial encounter: Secondary | ICD-10-CM

## 2019-04-22 NOTE — Patient Instructions (Signed)
MRI brain and cervical spine  Spinal Stenosis  Spinal stenosis occurs when the open space (spinal canal) between the bones of your spine (vertebrae) narrows, putting pressure on the spinal cord or nerves. What are the causes? This condition is caused by areas of bone pushing into the central canals of your vertebrae. This condition may be present at birth (congenital), or it may be caused by:  Arthritic deterioration of your vertebrae (spinal degeneration). This usually starts around age 61.  Injury or trauma to the spine.  Tumors in the spine.  Calcium deposits in the spine. What are the signs or symptoms? Symptoms of this condition include:  Pain in the neck or back that is generally worse with activities, particularly when standing and walking.  Numbness, tingling, hot or cold sensations, weakness, or weariness in your legs.  Pain going up and down the leg (sciatica).  Frequent episodes of falling.  A foot-slapping gait that leads to muscle weakness. In more serious cases, you may develop:  Problemspassing stool or passing urine.  Difficulty having sex.  Loss of feeling in part or all of your leg. Symptoms may come on slowly and get worse over time. How is this diagnosed? This condition is diagnosed based on your medical history and a physical exam. Tests will also be done, such as:  MRI.  CT scan.  X-ray. How is this treated? Treatment for this condition often focuses on managing your pain and any other symptoms. Treatment may include:  Practicing good posture to lessen pressure on your nerves.  Exercising to strengthen muscles, build endurance, improve balance, and maintain good joint movement (range of motion).  Losing weight, if needed.  Taking medicines to reduce swelling, inflammation, or pain.  Assistive devices, such as a corset or brace. In some cases, surgery may be needed. The most common procedure is decompression laminectomy. This is done to  remove excess bone that puts pressure on your nerve roots. Follow these instructions at home: Managing pain, stiffness, and swelling  Do all exercises and stretches as told by your health care provider.  Practice good posture. If you were given a brace or a corset, wear it as told by your health care provider.  Do not do any activities that cause pain. Ask your health care provider what activities are safe for you.  Do not lift anything that is heavier than 10 lb (4.5 kg) or the limit that your health care provider tells you.  Maintain a healthy weight. Talk with your health care provider if you need help losing weight.  If directed, apply heat to the affected area as often as told by your health care provider. Use the heat source that your health care provider recommends, such as a moist heat pack or a heating pad. ? Place a towel between your skin and the heat source. ? Leave the heat on for 20-30 minutes. ? Remove the heat if your skin turns bright red. This is especially important if you are not able to feel pain, heat, or cold. You may have a greater risk of getting burned. General instructions  Take over-the-counter and prescription medicines only as told by your health care provider.  Do not use any products that contain nicotine or tobacco, such as cigarettes and e-cigarettes. If you need help quitting, ask your health care provider.  Eat a healthy diet. This includes plenty of fruits and vegetables, whole grains, and low-fat (lean) protein.  Keep all follow-up visits as told by your health  care provider. This is important. Contact a health care provider if:  Your symptoms do not get better or they get worse.  You have a fever. Get help right away if:  You have new or worse pain in your neck or upper back.  You have severe pain that cannot be controlled with medicines.  You are dizzy.  You have vision problems, blurred vision, or double vision.  You have a severe  headache that is worse when you stand.  You have nausea or you vomit.  You develop new or worse numbness or tingling in your back or legs.  You have pain, redness, swelling, or warmth in your arm or leg. Summary  Spinal stenosis occurs when the open space (spinal canal) between the bones of your spine (vertebrae) narrows. This narrowing puts pressure on the spinal cord or nerves.  Spinal stenosis can cause numbness, weakness, or pain in the neck, back, and legs.  This condition may be caused by a birth defect, arthritic deterioration of your vertebrae, injury, tumors, or calcium deposits.  This condition is usually diagnosed with MRIs, CT scans, and X-rays. This information is not intended to replace advice given to you by your health care provider. Make sure you discuss any questions you have with your health care provider. Document Released: 10/20/2003 Document Revised: 07/12/2017 Document Reviewed: 07/04/2016 Elsevier Patient Education  2020 [employer].

## 2019-04-22 NOTE — Progress Notes (Addendum)
[id number]    Provider:  Dr Jaynee Eagles Requesting Provider: Suella Broad, MD Primary Care Provider:  Chesley Noon, MD  CC:  Imbalance  HPI:  Emma Munoz is a 53 y.o. female here as requested by Suella Broad MD for imbalance.  Past medical history tobacco abuse, primary hyper parathyroidism, obesity, migraines, metabolic syndrome, insomnia, hypertension, hyperlipidemia, hyperglycemia, herniated cervical disc, depression, complex regional pain syndrome type II, depression. She is here with her husband. Started 2 years ago, felt imbalanced. One day she lost her balance and fell and broke her elbow, she was looking out of a window and when she turned around she moved too fast and she felt like she was falling in slow motion. She didn't trip, unclear why. She walks into doorways and walls frequently and moreso than before. She loses her balance and goes "hand in hand" with how she is feeling when the CPRS pain is bad she feels off balanced. She has not fallen recently. She leaned forward last evening and rocked back and forth and was imbalanced, little things like that, husband provides information. Other than current symptoms for CRPS, she denies any focal weakness. No FHx parkinson's disease. She sometimes has some hand tremor in the left when the pain is significant. She is active and walks 2.22miles a day but no balance exercises. No vision changes, no vertigo, no headaches, associated with the imbalance, no numbness and tingling in the feet.   Reviewed notes, labs and imaging from outside physicians, which showed:  I reviewed notes from Dr. Nelva Bush.  She is on clonidine, psych lobe benzopyrene, metformin, metoprolol, morphine immediate release tablet and extended release tablet.  She was seen by Dr. Dossie Der for complex regional pain syndrome.  She is on Flexeril and morphine IR and ER.  She has had ketamine infusion.  They talked about a spinal cord stimulator.  Dr. Herma Mering  manages her medications.  She has chronic pains syndrome.  The pain is in the left upper extremity.  She bought a treadmill and walks 2.5 miles a day.  Personally reviewed images with patient and husband and agree with the following: MRI 2007  1.  Moderate sized disk protrusion at C5-6 with extrusion of disk material up behind C5 and down behind C6.  There is significant mass effect on the anterior thecal sac and this is likely responsible for the patient's radicular symptoms.  2.  Remaining intervertebral disks are unremarkable. 3.  No acute bony findings. 4.  Normal MR appearance of the cervical spinal cord.  Review of Systems: Patient complains of symptoms per HPI as well as the following symptoms: falls, arm pain. Pertinent negatives and positives per HPI. All others negative.   Social History   Socioeconomic History  . Marital status: Married    Spouse name: Not on file  . Number of children: 3  . Years of education: Not on file  . Highest education level: Not on file  Occupational History  . Occupation: Retired  Scientific laboratory technician  . Financial resource strain: Not on file  . Food insecurity    Worry: Not on file    Inability: Not on file  . Transportation needs    Medical: Not on file    Non-medical: Not on file  Tobacco Use  . Smoking status: Former Smoker    Packs/day: 1.00    Years: 24.00    Pack years: 24.00    Types: Cigarettes    Quit date: 2015  Years since quitting: 5.7  . Smokeless tobacco: Never Used  . Tobacco comment: 12/22/2012 offered smoking cessation materials; pt declines  Substance and Sexual Activity  . Alcohol use: No    Comment: Rarely  . Drug use: No  . Sexual activity: Yes    Birth control/protection: Surgical  Lifestyle  . Physical activity    Days per week: Not on file    Minutes per session: Not on file  . Stress: Not on file  Relationships  . Social Herbalist on phone: Not on file    Gets together: Not on file    Attends  religious service: Not on file    Active member of club or organization: Not on file    Attends meetings of clubs or organizations: Not on file    Relationship status: Not on file  . Intimate partner violence    Fear of current or ex partner: Not on file    Emotionally abused: Not on file    Physically abused: Not on file    Forced sexual activity: Not on file  Other Topics Concern  . Not on file  Social History Narrative   Lives with husband and 17YO daughter. 1 dog. Disabled after left arm injury.    Family History  Problem Relation Age of Onset  . Early death Father 59       GUNSHOT  . Kidney disease Brother        s/p transplant  . Heart disease Maternal Grandmother        Heart defect  . Prostate cancer Maternal Grandfather   . Cancer Maternal Grandfather        prostate    Past Medical History:  Diagnosis Date  . Anemia   . CRPS (complex regional pain syndrome type II)   . Depression   . Gastroparesis   . GERD (gastroesophageal reflux disease)   . Heart murmur   . Herniated cervical disc   . Hyperglycemia   . Hyperlipidemia   . Hyperparathyroidism ([place])   . Hypertension   . IBS (irritable bowel syndrome)   . Insomnia   . Metabolic syndrome X   . Migraines   . Obesity   . Primary hyperparathyroidism ([place])   . Reflex sympathetic dystrophy   . SBO (small bowel obstruction) ([place])   . 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 Tobacco abuse     Patient Active Problem List   Diagnosis Date Noted  . Stenosis of cervical spine with myelopathy ([place]) 04/22/2019  . Hypercalcemia 10/13/2012  . Nausea and vomiting 10/10/2012  . Infected hematoma following procedure 05/15/2012  . Constipation   . IBS (irritable bowel syndrome)   . Hypoparathyroidism ([place]) 03/27/2012  . Fatigue 12/21/2011  . Abdominal pain 12/06/2011  . Open abdominal wall wound 12/06/2011  . Preop cardiovascular exam 11/16/2011  . Hypertension   . Primary  hyperparathyroidism ([place]) 11/01/2011  . Nausea 10/19/2011  . MIGRAINES, HX OF 07/22/2009  . PARESTHESIA 07/14/2008  . ALLERGIC RHINITIS 05/19/2008  . FATIGUE 06/18/2007  . HYPERLIPIDEMIA 06/17/2007  . METABOLIC SYNDROME X [id number]  . OBESITY 06/17/2007  . ANEMIA-NOS 06/17/2007  . TOBACCO ABUSE 06/17/2007  . DEPRESSION 06/17/2007  . 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 HYPERTENSION 06/17/2007  . GERD 06/17/2007  . GASTROPARESIS 06/17/2007  . HERNIATED CERVICAL DISC 06/17/2007  . INSOMNIA 06/17/2007    Past Surgical History:  Procedure Laterality Date  . BACK  SURGERY    . BOWEL RESECTION  04/25/2012   Procedure: SMALL BOWEL RESECTION;  Surgeon: Leighton Ruff, MD;  Location: [place];  Service: General;  Laterality: N/A;  small bowell resection, incisional hernia repair  . CESAREAN SECTION  85, 87, 95  . CHOLECYSTECTOMY  11/2011   Dr. Bary Castilla [facility]) - bowel perforation and repair  . COLLATERAL LIGAMENT REPAIR, KNEE  1987; 1990's   left  . HERNIA REPAIR  04/2012; 05/13/2012   incisional  . Hystrectomy  2005  . IMPLANTATION VAGAL NERVE STIMULATOR  07/2011   "lower back" (05/13/2012)  . INCISIONAL HERNIA REPAIR  04/25/2012   Procedure: HERNIA REPAIR INCISIONAL;  Surgeon: Leighton Ruff, MD;  Location: [place];  Service: General;  Laterality: N/A;  . LAPAROTOMY N/A 01/07/2013   Procedure: EXPLORATORY LAPAROTOMY,  LYSIS OF ADHESIONS , SMALL BOWEL RESECTION;  Surgeon: Leighton Ruff, MD;  Location: [place];  Service: General;  Laterality: N/A;  . [place]; 2000  . MYOMECTOMY  [id number]   "multiple; mostly w/scopes" (05/13/2012)  . PARATHYROIDECTOMY N/A 04/10/2018   Procedure: PARATHYROIDECTOMY WITH NECK EXPLORATION;  Surgeon: Armandina Gemma, MD;  Location: [facility];  Service: General;  Laterality: N/A;  . SHOULDER ARTHROSCOPY W/ ROTATOR CUFF REPAIR  ~ 2005   left  . TUBAL LIGATION  1995  . ULNAR NERVE REPAIR  1998   left "got my hand crushed"    Current  Outpatient Medications  Medication Sig Dispense Refill  . alendronate (FOSAMAX) 70 MG tablet Take 70 mg by mouth every Sunday.   0  . amLODipine-olmesartan (AZOR) 10-40 MG tablet Take 1 tablet by mouth daily.    . cetirizine (ZYRTEC) 10 MG tablet Take 10 mg by mouth daily.    . cyclobenzaprine (FLEXERIL) 10 MG tablet Take 20 mg by mouth at bedtime.     Marland Kitchen morphine (MS CONTIN) 30 MG 12 hr tablet Take 30 mg by mouth 2 (two) times daily.     Marland Kitchen morphine (MSIR) 30 MG tablet Take 30 mg by mouth 3 (three) times daily as needed (for breakthrough pain.).    Marland Kitchen omeprazole (PRILOSEC) 40 MG capsule Take 1 capsule (40 mg total) by mouth daily. 90 capsule 3  . OYSCO 500 500 MG TABS Take 500 mg by mouth daily.   2  . TRESIBA FLEXTOUCH 200 UNIT/ML SOPN Inject 50 Units into the skin daily.  5  . olmesartan (BENICAR) 40 MG tablet TAKE ONE TABLET (40 MG DOSE) BY MOUTH DAILY. **DISCONTINUE AZOR AND METOPROLOL**     No current facility-administered medications for this visit.     Allergies as of 04/22/2019 - Review Complete 04/22/2019  Allergen Reaction Noted  . Aspirin Anaphylaxis and Other (See Comments)   . Dilaudid [hydromorphone hcl] Nausea And Vomiting 04/06/2012  . Ibuprofen Anaphylaxis and Other (See Comments)   . Latex Hives 03/27/2012  . Nsaids Anaphylaxis and Other (See Comments)   . Gadolinium derivatives Nausea And Vomiting and Other (See Comments) 03/26/2012  . Statins Other (See Comments) 01/18/2016  . Metformin and related Nausea And Vomiting 04/04/2018    Vitals: BP (!) 150/113 (BP Location: Right Arm, Patient Position: Sitting) Comment: pt in pain & stress at this time d/t loss of family member  Pulse 93   Temp (!) 96.6 F (35.9 C) Comment: husband 97.8, both taken by front staff  Ht 5\' 6"  (1.676 m)   Wt 231 lb (104.8 kg)   BMI 37.28 kg/m  Last Weight:  Wt  Readings from Last 1 Encounters:  04/22/19 231 lb (104.8 kg)   Last Height:   Ht Readings from Last 1 Encounters:  04/22/19  5\' 6"  (1.676 m)     Physical exam: Exam: Gen: NAD, conversant, well nourised, obese, well groomed                     CV: RRR, no MRG. No Carotid Bruits. No peripheral edema, warm, nontender Eyes: Conjunctivae clear without exudates or hemorrhage  Neuro: Detailed Neurologic Exam  Speech:    Speech is normal; fluent and spontaneous with normal comprehension.  Cognition:    The patient is oriented to person, place, and time;     recent and remote memory intact;     language fluent;     normal attention, concentration,     fund of knowledge Cranial Nerves:    The pupils are equal, round, and reactive to light. The fundi are normal and spontaneous venous pulsations are present. Visual fields are full to finger confrontation. Extraocular movements are intact. Trigeminal sensation is intact and the muscles of mastication are normal. The face is symmetric. The palate elevates in the midline. Hearing intact. Voice is normal. Shoulder shrug is normal. The tongue has normal motion without fasciculations.   Coordination:    Normal finger to nose and heel to shin.    Gait:    Imbalance with tandem  Motor Observation:    No asymmetry, no atrophy, and no involuntary movements noted. Tone:    Normal muscle tone.    Posture:    Posture is slightly stooped    Strength:    Strength is V/V in the upper and lower limbs. (Could not assess left arm due to CRPS)     Sensation: intact to LT     Reflex Exam:  DTR's: ((Could not assess left arm due to CRPS)    Deep tendon reflexes in the upper and lower extremities are 3+ bilaterally.   Toes:    The toes are downgoing bilaterally.   Clonus:    Clonus is bilat AJs.    Assessment/Plan:  53 year old with upper motor neuron signs. MRI c-spine in 2007 showed spinal stenosis likely progressed but need to also rule out pathology in the brain as well. (MRI 2007: Moderate sized disk protrusion at C5-6 with extrusion of disk material up behind C5  and down behind C6.  There is significant mass effect on the anterior thecal sac).  Addendum: Needs CT Myelogram. Patient with myelopathic clinical exam, MRI showed cervical stenosis from 2007(see images), cannot have repeat MRi due to spinal Stimulator. Needs CT Myelogram and CT head w/wo  MRI brain and cervical spine - brisk reflexes, clonus, falls, ataxia - upper motor neuron lesion need MRI brain and cervical spine likely cervical stenosis with myelopathy but r/o brain lesions or strokes as well  Referral back to Melina Schools, MD  Orders Placed This Encounter  Procedures  . MR BRAIN W WO CONTRAST  . MR CERVICAL SPINE WO CONTRAST  . CT CERVICAL SPINE W CONTRAST  . DG MYELOGRAPHY LUMBAR INJ CERVICAL  . CT HEAD W & WO CONTRAST  . Basic Metabolic Panel  . Ambulatory referral to Orthopedic Surgery    Cc: Chesley Noon, MD,  Cordella Register, [facility] [address] [place], [place] [address]  Phone [phone] Fax [phone]

## 2019-04-23 ENCOUNTER — Encounter (HOSPITAL_COMMUNITY): Payer: Self-pay | Admitting: Radiology

## 2019-04-23 ENCOUNTER — Telehealth: Payer: Self-pay | Admitting: Neurology

## 2019-04-23 NOTE — Telephone Encounter (Signed)
Medicare/medicaid no auth. I faxed spine stimulator info to [facility] if it is MRI compatible they will reach out to the patient to schedule.

## 2019-04-23 NOTE — Telephone Encounter (Signed)
Unfortunately we cannot get an MRI. I am going to have to refer her back to [facility] and Dr. Nelva Bush to see what they would like for imaging. I am informing dr Nelva Bush and dr brooks thanks

## 2019-04-23 NOTE — Progress Notes (Unsigned)
Per [facility] Rep, patients Spinal Stimulator is not MRI safe

## 2019-04-23 NOTE — Telephone Encounter (Signed)
I got a message from [facility] stating Patients stimulator is not MRI safe per [facility] Rep.-----

## 2019-04-25 NOTE — Addendum Note (Signed)
Addended by: Sarina Ill B on: 04/25/2019 03:48 PM   Modules accepted: Orders

## 2019-04-27 ENCOUNTER — Telehealth: Payer: Self-pay | Admitting: Nurse Practitioner

## 2019-04-27 NOTE — Telephone Encounter (Signed)
I sent the CT order to GI they will reach out to the patient to schedule.

## 2019-04-27 NOTE — Telephone Encounter (Signed)
Phone call to patient to verify medication list and allergies for myelogram procedure. Pt aware she will not need to hold any medications for this procedure. Pre and post procedure instructions reviewed with pt. 

## 2019-04-27 NOTE — Telephone Encounter (Signed)
Noted  

## 2019-05-04 ENCOUNTER — Ambulatory Visit
Admission: RE | Admit: 2019-05-04 | Discharge: 2019-05-04 | Disposition: A | Discharge status: Home / Self Care | Payer: Medicare Other | Source: Ambulatory Visit | Attending: Neurology | Admitting: Neurology

## 2019-05-04 ENCOUNTER — Other Ambulatory Visit: Payer: Self-pay

## 2019-05-04 VITALS — BP 126/83 | HR 78

## 2019-05-04 DIAGNOSIS — M4802 Spinal stenosis, cervical region: Secondary | ICD-10-CM

## 2019-05-04 DIAGNOSIS — W19XXXA Unspecified fall, initial encounter: Secondary | ICD-10-CM

## 2019-05-04 DIAGNOSIS — R270 Ataxia, unspecified: Secondary | ICD-10-CM

## 2019-05-04 DIAGNOSIS — R292 Abnormal reflex: Secondary | ICD-10-CM

## 2019-05-04 DIAGNOSIS — M5020 Other cervical disc displacement, unspecified cervical region: Secondary | ICD-10-CM

## 2019-05-04 DIAGNOSIS — G1229 Other motor neuron disease: Secondary | ICD-10-CM

## 2019-05-04 DIAGNOSIS — G992 Myelopathy in diseases classified elsewhere: Secondary | ICD-10-CM

## 2019-05-04 DIAGNOSIS — R258 Other abnormal involuntary movements: Secondary | ICD-10-CM

## 2019-05-04 MED ORDER — MEPERIDINE HCL 100 MG/ML IJ SOLN
75.0000 mg | Freq: Once | INTRAMUSCULAR | Status: AC
  Administered 2019-05-04: 75 mg via INTRAMUSCULAR

## 2019-05-04 MED ORDER — IOPAMIDOL (ISOVUE-M 300) INJECTION 61%
10.0000 mL | Freq: Once | INTRAMUSCULAR | Status: AC | PRN
  Administered 2019-05-04: 10 mL via INTRATHECAL

## 2019-05-04 MED ORDER — DIAZEPAM 5 MG PO TABS: 10.0000 mg | ORAL_TABLET | Freq: Once | ORAL | Status: DC

## 2019-05-04 MED ORDER — DIAZEPAM 5 MG PO TABS
10.0000 mg | ORAL_TABLET | Freq: Once | ORAL | Status: AC
  Administered 2019-05-04: 10 mg via ORAL

## 2019-05-04 MED ORDER — ONDANSETRON HCL 4 MG/2ML IJ SOLN
4.0000 mg | Freq: Once | INTRAMUSCULAR | Status: AC
  Administered 2019-05-04: 4 mg via INTRAMUSCULAR

## 2019-05-04 NOTE — Discharge Instructions (Signed)

## 2019-05-06 ENCOUNTER — Telehealth: Payer: Self-pay | Admitting: Neurology

## 2019-05-06 NOTE — Telephone Encounter (Signed)
Emma Munoz who is a Psychologist, sport and exercise at [employer] reviewed the images(CT Myelogram) of her cervical spine and agrees he wants to see her and examine her. She may need surgery. He would like [place] to repeat imaging of the brain just to make sure her brain looks fine. Since we can't get an MRI, it would be a CT scan w/wo contrast. If she is ok with it, let me know and I cab get it ordered and have it completed before she sees the surgeon and discusses surgery for her neck. They should have called or be calling her, if she hasn;t heard from them let me know that as well.  thanks

## 2019-05-07 ENCOUNTER — Telehealth: Payer: Self-pay | Admitting: Neurology

## 2019-05-07 ENCOUNTER — Other Ambulatory Visit: Payer: Self-pay | Admitting: Neurology

## 2019-05-07 NOTE — Telephone Encounter (Signed)
Spoke with Dr. Jaynee Eagles. Cervical stenosis, referred to Dr. Rolena Infante.

## 2019-05-07 NOTE — Telephone Encounter (Signed)
Spoke with pt. Discussed Dr. Cathren Laine message. She is amenable to receiving a CT head w/wo contrast before seeing the surgeon. She verbalized understanding but she asked why there was a dx of lesion mentioned in the AVS she got from [facility]. I advised that was likely one of the things the doctor was looking for as the only diagnosis that has been discussed by MD after the scan is cervical stenosis. Pt verbalized understanding. I encouraged her to send a mychart message if she has any further questions. She stated Dr. Charmayne Sheer office had not called her yet.

## 2019-05-07 NOTE — Addendum Note (Signed)
Addended by: Sarina Ill B on: 05/07/2019 12:51 PM   Modules accepted: Orders

## 2019-05-07 NOTE — Telephone Encounter (Signed)
Medicare/medicaid order sent to GI. No auth they will reach out to the patient to schedule.  °

## 2019-05-07 NOTE — Addendum Note (Signed)
Addended by: Sarina Ill B on: 05/07/2019 12:49 PM   Modules accepted: Orders

## 2019-05-14 ENCOUNTER — Ambulatory Visit
Admission: RE | Admit: 2019-05-14 | Discharge: 2019-05-14 | Disposition: A | Discharge status: Home / Self Care | Payer: Medicare Other | Source: Ambulatory Visit | Attending: Neurology | Admitting: Neurology

## 2019-05-14 DIAGNOSIS — R258 Other abnormal involuntary movements: Secondary | ICD-10-CM

## 2019-05-14 DIAGNOSIS — R292 Abnormal reflex: Secondary | ICD-10-CM

## 2019-05-14 DIAGNOSIS — G1229 Other motor neuron disease: Secondary | ICD-10-CM

## 2019-05-14 DIAGNOSIS — G3281 Cerebellar ataxia in diseases classified elsewhere: Secondary | ICD-10-CM

## 2019-05-14 DIAGNOSIS — R270 Ataxia, unspecified: Secondary | ICD-10-CM

## 2019-05-14 DIAGNOSIS — W19XXXA Unspecified fall, initial encounter: Secondary | ICD-10-CM

## 2019-05-14 MED ORDER — IOPAMIDOL (ISOVUE-300) INJECTION 61%
75.0000 mL | Freq: Once | INTRAVENOUS | Status: AC | PRN
  Administered 2019-05-14: 75 mL via INTRAVENOUS

## 2019-05-20 ENCOUNTER — Ambulatory Visit: Payer: Medicare Other | Admitting: Neurology

## 2019-06-01 NOTE — Progress Notes (Deleted)
[id number]    Provider:  Dr Jaynee Eagles Requesting Provider: Suella Broad, MD Primary Care Provider:  Chesley Noon, MD  CC:  Imbalance  HPI:  Emma Munoz is a 53 y.o. female here as requested by Suella Broad MD for imbalance.  Past medical history tobacco abuse, primary hyper parathyroidism, obesity, migraines, metabolic syndrome, insomnia, hypertension, hyperlipidemia, hyperglycemia, herniated cervical disc, depression, complex regional pain syndrome type II, depression. She is here with her husband. Started 2 years ago, felt imbalanced. One day she lost her balance and fell and broke her elbow, she was looking out of a window and when she turned around she moved too fast and she felt like she was falling in slow motion. She didn't trip, unclear why. She walks into doorways and walls frequently and moreso than before. She loses her balance and goes "hand in hand" with how she is feeling when the CPRS pain is bad she feels off balanced. She has not fallen recently. She leaned forward last evening and rocked back and forth and was imbalanced, little things like that, husband provides information. Other than current symptoms for CRPS, she denies any focal weakness. No FHx parkinson's disease. She sometimes has some hand tremor in the left when the pain is significant. She is active and walks 2.75miles a day but no balance exercises. No vision changes, no vertigo, no headaches, associated with the imbalance, no numbness and tingling in the feet.   Reviewed notes, labs and imaging from outside physicians, which showed:  I reviewed notes from Dr. Nelva Bush.  She is on clonidine, psych lobe benzopyrene, metformin, metoprolol, morphine immediate release tablet and extended release tablet.  She was seen by Dr. Dossie Der for complex regional pain syndrome.  She is on Flexeril and morphine IR and ER.  She has had ketamine infusion.  They talked about a spinal cord stimulator.  Dr. Herma Mering  manages her medications.  She has chronic pains syndrome.  The pain is in the left upper extremity.  She bought a treadmill and walks 2.5 miles a day.  Personally reviewed images with patient and husband and agree with the following: MRI 2007  1.  Moderate sized disk protrusion at C5-6 with extrusion of disk material up behind C5 and down behind C6.  There is significant mass effect on the anterior thecal sac and this is likely responsible for the patient's radicular symptoms.  2.  Remaining intervertebral disks are unremarkable. 3.  No acute bony findings. 4.  Normal MR appearance of the cervical spinal cord.  Review of Systems: Patient complains of symptoms per HPI as well as the following symptoms: falls, arm pain. Pertinent negatives and positives per HPI. All others negative.   Social History   Socioeconomic History  . Marital status: Married    Spouse name: Not on file  . Number of children: 3  . Years of education: Not on file  . Highest education level: Not on file  Occupational History  . Occupation: Retired  Scientific laboratory technician  . Financial resource strain: Not on file  . Food insecurity    Worry: Not on file    Inability: Not on file  . Transportation needs    Medical: Not on file    Non-medical: Not on file  Tobacco Use  . Smoking status: Former Smoker    Packs/day: 1.00    Years: 24.00    Pack years: 24.00    Types: Cigarettes    Quit date: 2015  Years since quitting: 5.8  . Smokeless tobacco: Never Used  . Tobacco comment: 12/22/2012 offered smoking cessation materials; pt declines  Substance and Sexual Activity  . Alcohol use: No    Comment: Rarely  . Drug use: No  . Sexual activity: Yes    Birth control/protection: Surgical  Lifestyle  . Physical activity    Days per week: Not on file    Minutes per session: Not on file  . Stress: Not on file  Relationships  . Social Herbalist on phone: Not on file    Gets together: Not on file    Attends  religious service: Not on file    Active member of club or organization: Not on file    Attends meetings of clubs or organizations: Not on file    Relationship status: Not on file  . Intimate partner violence    Fear of current or ex partner: Not on file    Emotionally abused: Not on file    Physically abused: Not on file    Forced sexual activity: Not on file  Other Topics Concern  . Not on file  Social History Narrative   Lives with husband and 17YO daughter. 1 dog. Disabled after left arm injury.    Family History  Problem Relation Age of Onset  . Early death Father 34       GUNSHOT  . Kidney disease Brother        s/p transplant  . Heart disease Maternal Grandmother        Heart defect  . Prostate cancer Maternal Grandfather   . Cancer Maternal Grandfather        prostate    Past Medical History:  Diagnosis Date  . Anemia   . CRPS (complex regional pain syndrome type II)   . Depression   . Gastroparesis   . GERD (gastroesophageal reflux disease)   . Heart murmur   . Herniated cervical disc   . Hyperglycemia   . Hyperlipidemia   . Hyperparathyroidism ([place])   . Hypertension   . IBS (irritable bowel syndrome)   . Insomnia   . Metabolic syndrome X   . Migraines   . Obesity   . Primary hyperparathyroidism ([place])   . Reflex sympathetic dystrophy   . SBO (small bowel obstruction) ([place])   . 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 Tobacco abuse     Patient Active Problem List   Diagnosis Date Noted  . Stenosis of cervical spine with myelopathy ([place]) 04/22/2019  . Hypercalcemia 10/13/2012  . Nausea and vomiting 10/10/2012  . Infected hematoma following procedure 05/15/2012  . Constipation   . IBS (irritable bowel syndrome)   . Hypoparathyroidism ([place]) 03/27/2012  . Fatigue 12/21/2011  . Abdominal pain 12/06/2011  . Open abdominal wall wound 12/06/2011  . Preop cardiovascular exam 11/16/2011  . Hypertension   . Primary  hyperparathyroidism ([place]) 11/01/2011  . Nausea 10/19/2011  . MIGRAINES, HX OF 07/22/2009  . PARESTHESIA 07/14/2008  . ALLERGIC RHINITIS 05/19/2008  . FATIGUE 06/18/2007  . HYPERLIPIDEMIA 06/17/2007  . METABOLIC SYNDROME X [id number]  . OBESITY 06/17/2007  . ANEMIA-NOS 06/17/2007  . TOBACCO ABUSE 06/17/2007  . DEPRESSION 06/17/2007  . 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 HYPERTENSION 06/17/2007  . GERD 06/17/2007  . GASTROPARESIS 06/17/2007  . HERNIATED CERVICAL DISC 06/17/2007  . INSOMNIA 06/17/2007    Past Surgical History:  Procedure Laterality Date  . BACK  SURGERY    . BOWEL RESECTION  04/25/2012   Procedure: SMALL BOWEL RESECTION;  Surgeon: Leighton Ruff, MD;  Location: [facility];  Service: General;  Laterality: N/A;  small bowell resection, incisional hernia repair  . CESAREAN SECTION  85, 87, 95  . CHOLECYSTECTOMY  11/2011   Dr. [facility]ary [facility]) - bowel perforation and repair  . COLLATERAL LIGAMENT REPAIR, KNEE  1987; 1990's   left  . HERNIA REPAIR  04/2012; 05/13/2012   incisional  . Hystrectomy  2005  . IMPLANTATION VAGAL NERVE STIMULATOR  07/2011   "lower back" (05/13/2012)  . INCISIONAL HERNIA REPAIR  04/25/2012   Procedure: HERNIA REPAIR INCISIONAL;  Surgeon: Leighton Ruff, MD;  Location: [facility];  Service: General;  Laterality: N/A;  . LAPAROTOMY N/A 01/07/2013   Procedure: EXPLORATORY LAPAROTOMY,  LYSIS OF ADHESIONS , SMALL BOWEL RESECTION;  Surgeon: Leighton Ruff, MD;  Location: [facility];  Service: General;  Laterality: N/A;  . [place]; 2000  . MYOMECTOMY  [id number]   "multiple; mostly w/scopes" (05/13/2012)  . PARATHYROIDECTOMY N/A 04/10/2018   Procedure: PARATHYROIDECTOMY WITH NECK EXPLORATION;  Surgeon: Armandina Gemma, MD;  Location: [facility];  Service: General;  Laterality: N/A;  . SHOULDER ARTHROSCOPY W/ ROTATOR CUFF REPAIR  ~ 2005   left  . TUBAL LIGATION  1995  . ULNAR NERVE REPAIR  1998   left "got my hand crushed"    Current  Outpatient Medications  Medication Sig Dispense Refill  . alendronate (FOSAMAX) 70 MG tablet Take 70 mg by mouth every Sunday.   0  . amLODipine-olmesartan (AZOR) 10-40 MG tablet Take 1 tablet by mouth daily.    . cetirizine (ZYRTEC) 10 MG tablet Take 10 mg by mouth daily.    . cyclobenzaprine (FLEXERIL) 10 MG tablet Take 20 mg by mouth at bedtime.     Marland Kitchen morphine (MS CONTIN) 30 MG 12 hr tablet Take 30 mg by mouth 2 (two) times daily.     Marland Kitchen morphine (MSIR) 30 MG tablet Take 30 mg by mouth 3 (three) times daily as needed (for breakthrough pain.).    Marland Kitchen olmesartan (BENICAR) 40 MG tablet TAKE ONE TABLET (40 MG DOSE) BY MOUTH DAILY. **DISCONTINUE AZOR AND METOPROLOL**    . omeprazole (PRILOSEC) 40 MG capsule Take 1 capsule (40 mg total) by mouth daily. 90 capsule 3  . OYSCO 500 500 MG TABS Take 500 mg by mouth daily.   2  . TRESIBA FLEXTOUCH 200 UNIT/ML SOPN Inject 50 Units into the skin daily.  5   No current facility-administered medications for this visit.     Allergies as of 06/02/2019 - Review Complete 05/04/2019  Allergen Reaction Noted  . Aspirin Anaphylaxis and Other (See Comments)   . Dilaudid [hydromorphone hcl] Nausea And Vomiting 04/06/2012  . Ibuprofen Anaphylaxis and Other (See Comments)   . Nsaids Anaphylaxis and Other (See Comments)   . Gadolinium derivatives Nausea And Vomiting and Other (See Comments) 03/26/2012  . Latex Hives 03/27/2012  . Metformin and related Nausea And Vomiting 04/04/2018  . Statins Other (See Comments) 01/18/2016    Vitals: There were no vitals taken for this visit. Last Weight:  Wt Readings from Last 1 Encounters:  04/22/19 231 lb (104.8 kg)   Last Height:   Ht Readings from Last 1 Encounters:  04/22/19 5\' 6"  (1.676 m)     Physical exam: Exam: Gen: NAD, conversant, well nourised, obese, well groomed  CV: RRR, no MRG. No Carotid Bruits. No peripheral edema, warm, nontender Eyes: Conjunctivae clear without exudates or  hemorrhage  Neuro: Detailed Neurologic Exam  Speech:    Speech is normal; fluent and spontaneous with normal comprehension.  Cognition:    The patient is oriented to person, place, and time;     recent and remote memory intact;     language fluent;     normal attention, concentration,     fund of knowledge Cranial Nerves:    The pupils are equal, round, and reactive to light. The fundi are normal and spontaneous venous pulsations are present. Visual fields are full to finger confrontation. Extraocular movements are intact. Trigeminal sensation is intact and the muscles of mastication are normal. The face is symmetric. The palate elevates in the midline. Hearing intact. Voice is normal. Shoulder shrug is normal. The tongue has normal motion without fasciculations.   Coordination:    Normal finger to nose and heel to shin.    Gait:    Imbalance with tandem  Motor Observation:    No asymmetry, no atrophy, and no involuntary movements noted. Tone:    Normal muscle tone.    Posture:    Posture is slightly stooped    Strength:    Strength is V/V in the upper and lower limbs. (Could not assess left arm due to CRPS)     Sensation: intact to LT     Reflex Exam:  DTR's: ((Could not assess left arm due to CRPS)    Deep tendon reflexes in the upper and lower extremities are 3+ bilaterally.   Toes:    The toes are downgoing bilaterally.   Clonus:    Clonus is bilat AJs.    Assessment/Plan:  53 year old with upper motor neuron signs. MRI c-spine in 2007 showed spinal stenosis likely progressed but need to also rule out pathology in the brain as well. (MRI 2007: Moderate sized disk protrusion at C5-6 with extrusion of disk material up behind C5 and down behind C6.  There is significant mass effect on the anterior thecal sac).  Addendum: Needs CT Myelogram. Patient with myelopathic clinical exam, MRI showed cervical stenosis from 2007(see images), cannot have repeat MRi due to spinal  Stimulator. Needs CT Myelogram and CT head w/wo  MRI brain and cervical spine - brisk reflexes, clonus, falls, ataxia - upper motor neuron lesion need MRI brain and cervical spine likely cervical stenosis with myelopathy but r/o brain lesions or strokes as well  Referral back to Melina Schools, MD  No orders of the defined types were placed in this encounter.   Cc: Chesley Noon, MD,  [facility]ord[facility] Register, [facility] [address] [place], [place] [address]  Phone [phone] Fax [phone]

## 2019-06-02 ENCOUNTER — Ambulatory Visit: Payer: Medicare Other | Admitting: Neurology

## 2019-06-02 ENCOUNTER — Telehealth: Payer: Self-pay | Admitting: *Deleted

## 2019-06-02 NOTE — Telephone Encounter (Signed)
Pt no showed f/u appt today.

## 2019-06-04 ENCOUNTER — Encounter: Payer: Self-pay | Admitting: Neurology

## 2019-06-10 ENCOUNTER — Telehealth: Payer: Self-pay | Admitting: Neurology

## 2019-06-10 NOTE — Telephone Encounter (Signed)
Emma Munoz can you see if Emma Munoz has a genetic test please? If not [facility] or [facility]? thanks

## 2019-06-10 NOTE — Telephone Encounter (Signed)
I have reached out to the [facility] rep to obtain an order form for Neurofibromatosis type 1 panel.

## 2019-06-10 NOTE — Telephone Encounter (Signed)
Pt called stating that she will be seeing her PCP tomorrow and she would like to know if she will be needing any lab work done for the appt here so that she can get it all done at one time tomorrow. Please advise.

## 2019-06-17 NOTE — Telephone Encounter (Signed)
Order form in process.

## 2019-06-18 ENCOUNTER — Encounter: Payer: Self-pay | Admitting: *Deleted

## 2019-06-18 NOTE — Telephone Encounter (Signed)
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 Received a receipt of confirmation.  Sent pt a Pharmacist, community message with update.

## 2019-07-16 ENCOUNTER — Encounter: Payer: Self-pay | Admitting: *Deleted

## 2019-07-16 NOTE — Telephone Encounter (Signed)
We received faxes from [facility] stating ICD 10 codes were needed and also that they were not able to produce a quality result with the sample. I spoke with Magda Paganini @ [facility] and provided ICD 10 code from office note. She stated the saliva sample didn't have enough. They want to know if Dr. Jaynee Eagles wants a phlebotomy sample or if they should send another saliva kit to pt.   I spoke with Dr. Jaynee Eagles. She would like a phlebotomy sample to be completed. I called [facility] back and let Johanna know. She will have the team call the patient to setup a blood draw. They will mail the kit to the pt's home. Confirmed the contact number they have on file is correct according to our records. I messaged the patient in mychart to give her a head's up that the kit is coming and why.

## 2019-07-21 ENCOUNTER — Ambulatory Visit (INDEPENDENT_AMBULATORY_CARE_PROVIDER_SITE_OTHER): Payer: Medicare Other | Admitting: Neurology

## 2019-07-21 ENCOUNTER — Other Ambulatory Visit: Payer: Self-pay

## 2019-07-21 ENCOUNTER — Encounter: Payer: Self-pay | Admitting: Neurology

## 2019-07-21 VITALS — BP 189/117 | HR 80 | Temp 96.9°F | Ht 66.0 in | Wt 236.0 lb

## 2019-07-21 DIAGNOSIS — M4802 Spinal stenosis, cervical region: Secondary | ICD-10-CM

## 2019-07-21 DIAGNOSIS — Q8500 Neurofibromatosis, unspecified: Secondary | ICD-10-CM | POA: Diagnosis not present

## 2019-07-21 NOTE — Progress Notes (Signed)
[id number]    Provider:  Dr Jaynee Eagles Requesting Provider: Suella Broad, MD Primary Care Provider:  Chesley Noon, MD  CC:  Imbalance  Interval history 07/21/2019: She is here for imbalance. I do think her cervical stenosis has something to do with it, but she was evaluated by Dr. Rolena Infante and surgery is not needed, I recommend having this followed y Dr. Nelva Bush. We are awaiting NF1 testing and if positive we will refer her to the NF1 clinic, she has extensive family history in her mother's side of the family. 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 If she tests positive we will refer to neurofibromatosis clinic at [facility] for  NF1.   HPI:  Emma Munoz is a 53 y.o. female here as requested by Suella Broad MD for imbalance.  Past medical history tobacco abuse, primary hyper parathyroidism, obesity, migraines, metabolic syndrome, insomnia, hypertension, hyperlipidemia, hyperglycemia, herniated cervical disc, depression, complex regional pain syndrome type II, depression. She is here with her husband. Started 2 years ago, felt imbalanced. One day she lost her balance and fell and broke her elbow, she was looking out of a window and when she turned around she moved too fast and she felt like she was falling in slow motion. She didn't trip, unclear why. She walks into doorways and walls frequently and moreso than before. She loses her balance and goes "hand in hand" with how she is feeling when the CPRS pain is bad she feels off balanced. She has not fallen recently. She leaned forward last evening and rocked back and forth and was imbalanced, little things like that, husband provides information. Other than current symptoms for CRPS, she denies any focal weakness. No FHx parkinson's disease. She sometimes has some hand tremor in the left when the pain is significant. She is active and walks 2.30miles a day but no balance exercises. No vision changes, no vertigo, no headaches,  associated with the imbalance, no numbness and tingling in the feet.   Reviewed notes, labs and imaging from outside physicians, which showed:  I reviewed notes from Dr. Nelva Bush.  She is on clonidine, psych lobe benzopyrene, metformin, metoprolol, morphine immediate release tablet and extended release tablet.  She was seen by Dr. Dossie Der for complex regional pain syndrome.  She is on Flexeril and morphine IR and ER.  She has had ketamine infusion.  They talked about a spinal cord stimulator.  Dr. Herma Mering manages her medications.  She has chronic pains syndrome.  The pain is in the left upper extremity.  She bought a treadmill and walks 2.5 miles a day.  Personally reviewed images with patient and husband and agree with the following: MRI 2007  1.  Moderate sized disk protrusion at C5-6 with extrusion of disk material up behind C5 and down behind C6.  There is significant mass effect on the anterior thecal sac and this is likely responsible for the patient's radicular symptoms.  2.  Remaining intervertebral disks are unremarkable. 3.  No acute bony findings. 4.  Normal MR appearance of the cervical spinal cord.  Review of Systems: Patient complains of symptoms per HPI as well as the following symptoms: falls, arm pain. Pertinent negatives and positives per HPI. All others negative.   Social History   Socioeconomic History   Marital status: Married    Spouse name: Not on file   Number of children: 3   Years of education: Not on file   Highest education level:  Not on file  Occupational History   Occupation: Retired  Scientist, product/process development strain: Not on file   Food insecurity    Worry: Not on file    Inability: Not on Lexicographer needs    Medical: Not on file    Non-medical: Not on file  Tobacco Use   Smoking status: Former Smoker    Packs/day: 1.00    Years: 24.00    Pack years: 24.00    Types: Cigarettes    Quit date: 2015    Years since quitting: 5.9     Smokeless tobacco: Never Used   Tobacco comment: 12/22/2012 offered smoking cessation materials; pt declines  Substance and Sexual Activity   Alcohol use: No    Comment: Rarely   Drug use: No   Sexual activity: Yes    Birth control/protection: Surgical  Lifestyle   Physical activity    Days per week: Not on file    Minutes per session: Not on file   Stress: Not on file  Relationships   Social connections    Talks on phone: Not on file    Gets together: Not on file    Attends religious service: Not on file    Active member of club or organization: Not on file    Attends meetings of clubs or organizations: Not on file    Relationship status: Not on file   Intimate partner violence    Fear of current or ex partner: Not on file    Emotionally abused: Not on file    Physically abused: Not on file    Forced sexual activity: Not on file  Other Topics Concern   Not on file  Social History Narrative   Lives with husband and 17YO daughter. 1 dog. Disabled after left arm injury.   Right handed    Family History  Problem Relation Age of Onset   Early death Father 15       GUNSHOT   Kidney disease Brother        s/p transplant   Other Mother        NF 1 positive   Heart disease Maternal Grandmother        Heart defect   Prostate cancer Maternal Grandfather    Cancer Maternal Grandfather        prostate   Other Daughter        NF 1 positive   Other Granddaughter        NF 1 positive    Past Medical History:  Diagnosis Date   Anemia    CRPS (complex regional pain syndrome type II)    Depression    Gastroparesis    GERD (gastroesophageal reflux disease)    Heart murmur    Herniated cervical disc    Hyperglycemia    Hyperlipidemia    Hyperparathyroidism (HCC)    Hypertension    IBS (irritable bowel syndrome)    Insomnia    Metabolic syndrome X    Migraines    Obesity    Primary hyperparathyroidism (HCC)    Reflex sympathetic  dystrophy    SBO (small bowel obstruction) ([place])    Stenosis colon (HCC)    s/p bowel resection for small bowel stenosis   Tobacco abuse     Patient Active Problem List   Diagnosis Date Noted   Cervical spinal stenosis 07/21/2019   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/2019   Stenosis of cervical spine with myelopathy ([place]) 04/22/2019  Hypercalcemia 10/13/2012   Nausea and vomiting 10/10/2012   Infected hematoma following procedure 05/15/2012   Constipation    IBS (irritable bowel syndrome)    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   Fatigue 12/21/2011   Abdominal pain 12/06/2011   Open abdominal wall wound 12/06/2011   Preop cardiovascular exam 11/16/2011   Hypertension    Primary hyperparathyroidism ([place]) 11/01/2011   Nausea 10/19/2011   MIGRAINES, HX OF 07/22/2009   PARESTHESIA 07/14/2008   ALLERGIC RHINITIS 05/19/2008   FATIGUE 06/18/2007   HYPERLIPIDEMIA 123XX123   METABOLIC SYNDROME X 123XX123   OBESITY 06/17/2007   ANEMIA-NOS 06/17/2007   TOBACCO ABUSE 06/17/2007   DEPRESSION 06/17/2007   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 Bary Castilla ([facility]) - bowel perforation and repair   COLLATERAL LIGAMENT REPAIR, KNEE  1987; 1990's   left   HERNIA REPAIR  04/2012; 05/13/2012   incisional   Hystrectomy  2005   IMPLANTATION VAGAL NERVE STIMULATOR  07/2011   "lower back" (05/13/2012)   [place]  04/25/2012   Procedure: HERNIA REPAIR INCISIONAL;   Surgeon: Leighton Ruff, MD;  Location: [facility];  Service: General;  Laterality: N/A;   LAPAROTOMY N/A 01/07/2013   Procedure: EXPLORATORY LAPAROTOMY,  LYSIS OF ADHESIONS , SMALL BOWEL RESECTION;  Surgeon: Leighton Ruff, MD;  Location: [facility];  Service: General;  Laterality: N/A;   [place]; 2000   MYOMECTOMY  1982-2005   "multiple; mostly w/scopes" (05/13/2012)   PARATHYROIDECTOMY N/A 04/10/2018   Procedure: PARATHYROIDECTOMY WITH NECK EXPLORATION;  Surgeon: Armandina Gemma, MD;  Location: [facility];  Service: General;  Laterality: N/A;   SHOULDER ARTHROSCOPY W/ ROTATOR CUFF REPAIR  ~ 2005   left   [place]   left "got my hand crushed"    Current Outpatient Medications  Medication Sig Dispense Refill   alendronate (FOSAMAX) 70 MG tablet Take 70 mg by mouth every Sunday.   0   amLODipine-olmesartan (AZOR) 10-40 MG tablet Take 1 tablet by mouth daily.     cetirizine (ZYRTEC) 10 MG tablet Take 10 mg by mouth daily.     cyclobenzaprine (FLEXERIL) 10 MG tablet Take 20 mg by mouth at bedtime.      morphine (MS CONTIN) 30 MG 12 hr tablet Take 30 mg by mouth 2 (two) times daily.      morphine (MSIR) 30 MG tablet Take 30 mg by mouth 3 (three) times daily as needed (for breakthrough pain.).     olmesartan (BENICAR) 40 MG tablet TAKE ONE TABLET (40 MG DOSE) BY MOUTH DAILY. **DISCONTINUE AZOR AND METOPROLOL**     omeprazole (PRILOSEC) 40 MG capsule Take 1 capsule (40 mg total) by mouth daily. 90 capsule 3   OYSCO 500 500 MG TABS Take 500 mg by mouth daily.   2   TRESIBA FLEXTOUCH 200 UNIT/ML SOPN Inject 50 Units into the skin daily.  5   No current facility-administered medications for this visit.  Allergies as of 07/21/2019 - Review Complete 07/21/2019  Allergen Reaction Noted   Aspirin Anaphylaxis and Other (See Comments)    Dilaudid [hydromorphone hcl] Nausea And Vomiting 04/06/2012   Ibuprofen Anaphylaxis and Other (See  Comments)    Nsaids Anaphylaxis and Other (See Comments)    Gadolinium derivatives Nausea And Vomiting and Other (See Comments) 03/26/2012   Latex Hives 03/27/2012   Metformin and related Nausea And Vomiting 04/04/2018   Statins Other (See Comments) 01/18/2016    Vitals: BP (!) 189/117 (BP Location: Right Arm, Patient Position: Sitting)    Pulse 80    Temp (!) 96.9 F (36.1 C) Comment: taken at front door   Ht 5\' 6"  (1.676 m)    Wt 236 lb (107 kg)    BMI 38.09 kg/m  Last Weight:  Wt Readings from Last 1 Encounters:  07/21/19 236 lb (107 kg)   Last Height:   Ht Readings from Last 1 Encounters:  07/21/19 5\' 6"  (1.676 m)     Physical exam: Exam: Gen: NAD, conversant, well nourised, obese, well groomed                     CV: RRR, no MRG. No Carotid Bruits. No peripheral edema, warm, nontender Eyes: Conjunctivae clear without exudates or hemorrhage  Neuro: Detailed Neurologic Exam  Speech:    Speech is normal; fluent and spontaneous with normal comprehension.  Cognition:    The patient is oriented to person, place, and time;     recent and remote memory intact;     language fluent;     normal attention, concentration,     fund of knowledge Cranial Nerves:    The pupils are equal, round, and reactive to light. The fundi are normal and spontaneous venous pulsations are present. Visual fields are full to finger confrontation. Extraocular movements are intact. Trigeminal sensation is intact and the muscles of mastication are normal. The face is symmetric. The palate elevates in the midline. Hearing intact. Voice is normal. Shoulder shrug is normal. The tongue has normal motion without fasciculations.   Coordination:    Normal finger to nose and heel to shin.    Gait:    Imbalance with tandem  Motor Observation:    No asymmetry, no atrophy, and no involuntary movements noted. Tone:    Normal muscle tone.    Posture:    Posture is slightly stooped    Strength:     Strength is V/V in the upper and lower limbs. (Could not assess left arm due to CRPS)     Sensation: intact to LT     Reflex Exam:  DTR's: ((Could not assess left arm due to CRPS)    Deep tendon reflexes in the upper and lower extremities are 3+ bilaterally.   Toes:    The toes are downgoing bilaterally.   Clonus:    Clonus is bilat AJs.    Assessment/Plan:  53 year old with upper motor neuron signs. MRI c-spine in 2007 showed spinal stenosis likely progressed but need to also rule out pathology in the brain as well. (MRI 2007: Moderate sized disk protrusion at C5-6 with extrusion of disk material up behind C5 and down behind C6.  There is significant mass effect on the anterior thecal sac).  She is here for imbalance. I do think her cervical stenosis has something to do with it, but she was evaluated by Dr. Rolena Infante and surgery is not needed, I recommend having this followed  y Dr. Nelva Bush. We are awaiting NF1 testing and if positive we will refer her to the NF1 clinic, she has extensive family history in her mother's side of the family. 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 If she tests positive we will refer to neurofibromatosis clinic at [facility] for  NF1.   Addendum: Needs CT Myelogram. Patient with myelopathic clinical exam, MRI showed cervical stenosis from 2007(see images), cannot have repeat MRi due to spinal Stimulator. Needs CT Myelogram and CT head w/wo  MRI brain and cervical spine - brisk reflexes, clonus, falls, ataxia - upper motor neuron lesion need MRI brain and cervical spine likely cervical stenosis with myelopathy but r/o brain lesions or strokes as well  Referral back to Melina Schools, MD  Orders Placed This Encounter  Procedures   Ambulatory referral to Neurology    Cc: Chesley Noon, MD,  Cordella Register, MD  [facility] [address] [place], [place] [address]  Phone [phone] Fax [phone]  A  total of 25  minutes was spent face-to-face with this patient. Over half this time was spent on counseling patient on the  1. NF (neurofibromatosis) ([place])   2. Cervical spinal stenosis    diagnosis and different diagnostic and therapeutic options, counseling and coordination of care, risks ans benefits of management, compliance, or risk factor reduction and education.

## 2019-07-21 NOTE — Patient Instructions (Signed)
Spinal Stenosis  Spinal stenosis occurs when the open space (spinal canal) between the bones of your spine (vertebrae) narrows, putting pressure on the spinal cord or nerves. What are the causes? This condition is caused by areas of bone pushing into the central canals of your vertebrae. This condition may be present at birth (congenital), or it may be caused by:  Arthritic deterioration of your vertebrae (spinal degeneration). This usually starts around age 50.  Injury or trauma to the spine.  Tumors in the spine.  Calcium deposits in the spine. What are the signs or symptoms? Symptoms of this condition include:  Pain in the neck or back that is generally worse with activities, particularly when standing and walking.  Numbness, tingling, hot or cold sensations, weakness, or weariness in your legs.  Pain going up and down the leg (sciatica).  Frequent episodes of falling.  A foot-slapping gait that leads to muscle weakness. In more serious cases, you may develop:  Problemspassing stool or passing urine.  Difficulty having sex.  Loss of feeling in part or all of your leg. Symptoms may come on slowly and get worse over time. How is this diagnosed? This condition is diagnosed based on your medical history and a physical exam. Tests will also be done, such as:  MRI.  CT scan.  X-ray. How is this treated? Treatment for this condition often focuses on managing your pain and any other symptoms. Treatment may include:  Practicing good posture to lessen pressure on your nerves.  Exercising to strengthen muscles, build endurance, improve balance, and maintain good joint movement (range of motion).  Losing weight, if needed.  Taking medicines to reduce swelling, inflammation, or pain.  Assistive devices, such as a corset or brace. In some cases, surgery may be needed. The most common procedure is decompression laminectomy. This is done to remove excess bone that puts  pressure on your nerve roots. Follow these instructions at home: Managing pain, stiffness, and swelling  Do all exercises and stretches as told by your health care provider.  Practice good posture. If you were given a brace or a corset, wear it as told by your health care provider.  Do not do any activities that cause pain. Ask your health care provider what activities are safe for you.  Do not lift anything that is heavier than 10 lb (4.5 kg) or the limit that your health care provider tells you.  Maintain a healthy weight. Talk with your health care provider if you need help losing weight.  If directed, apply heat to the affected area as often as told by your health care provider. Use the heat source that your health care provider recommends, such as a moist heat pack or a heating pad. ? Place a towel between your skin and the heat source. ? Leave the heat on for 20-30 minutes. ? Remove the heat if your skin turns bright red. This is especially important if you are not able to feel pain, heat, or cold. You may have a greater risk of getting burned. General instructions  Take over-the-counter and prescription medicines only as told by your health care provider.  Do not use any products that contain nicotine or tobacco, such as cigarettes and e-cigarettes. If you need help quitting, ask your health care provider.  Eat a healthy diet. This includes plenty of fruits and vegetables, whole grains, and low-fat (lean) protein.  Keep all follow-up visits as told by your health care provider. This is important. Contact   a health care provider if:  Your symptoms do not get better or they get worse.  You have a fever. Get help right away if:  You have new or worse pain in your neck or upper back.  You have severe pain that cannot be controlled with medicines.  You are dizzy.  You have vision problems, blurred vision, or double vision.  You have a severe headache that is worse when you  stand.  You have nausea or you vomit.  You develop new or worse numbness or tingling in your back or legs.  You have pain, redness, swelling, or warmth in your arm or leg. Summary  Spinal stenosis occurs when the open space (spinal canal) between the bones of your spine (vertebrae) narrows. This narrowing puts pressure on the spinal cord or nerves.  Spinal stenosis can cause numbness, weakness, or pain in the neck, back, and legs.  This condition may be caused by a birth defect, arthritic deterioration of your vertebrae, injury, tumors, or calcium deposits.  This condition is usually diagnosed with MRIs, CT scans, and X-rays. This information is not intended to replace advice given to you by your health care provider. Make sure you discuss any questions you have with your health care provider. Document Released: 10/20/2003 Document Revised: 07/12/2017 Document Reviewed: 07/04/2016 Elsevier Patient Education  2020 [employer].   Neurofibromatosis, Adult Neurofibromatosis is a rare genetic disorder that causes skin and bone changes and the development of nerve tumors. The tumors vary depending on the type of neurofibromatosis you have, but they are usually not cancerous (are usually benign). There are three types of neurofibromatosis:  Neurofibromatosis 1 (NF1). This is the most common type.  Neurofibromatosis 2 (NF2).  Schwannomatosis. This is the least common type. In most cases, this condition gets worse over time. What are the causes? This condition is caused by a change, or mutation, in genes that control the growth of nerve cells. You may have neurofibromatosis:  At birth. This means you inherited mutated genes from your parents. Up to half of all cases of NF1 and NF2 are inherited.  After birth. This means that your genes mutated after you were born. It is not known why this happens. This is the most common cause of schwannomatosis. What are the signs or symptoms? Symptoms  of this condition depend on the type of neurofibromatosis you have.  Signs and symptoms of NF1 usually begin in childhood. They may include:  Coffee-colored skin spots (cafe au lait spots).  Freckles in the groin or under the arms.  Benign tumors (neurofibromas) attached to one or more nerves.  Discolored specks in the colored part of the eye (iris).  Curving of the spine or other bone deformities.  Short stature.  Headaches.  Seizures.  Learning disabilities. Signs and symptoms of NF2 usually begin in early adulthood. They may include:  Benign tumors that develop on a nerve inside the ear (schwannoma).  Ringing in the ears.  Sudden hearing loss or hearing loss that gets worse over time.  Balance problems or dizziness.  Headache.  Ear pressure or pain.  Facial pain on the affected side.  Vision changes.  Numbness or weakness of the face on the affected side. Signs and symptoms of schwannomatosis usually do not show up until adulthood. They may include:  Long-term pain. This is the most common symptom.  Development of many tumors throughout the body except in the ear.  Numbness.  Tingling. How is this diagnosed? This condition may  be diagnosed based on your symptoms and a physical exam. Your health care provider may also do tests to help make the diagnosis. These may include:  Blood tests.  Genetic testing to look for gene mutations.  Imaging studies such as an MRI or CT scan.  Hearing, vision, and balance tests.  Tests for learning disabilities. It may take many years to be diagnosed with the disease because signs and symptoms may develop slowly. How is this treated? There is no cure for this condition, but treatment can relieve symptoms. Treatment may include:  Medicines to control pain or seizures.  Physical therapy for disabilities.  Screening and close monitoring for complications such as neurofibromas in the eye and brain.  Surgery to remove  tumors that cause symptoms or become disfiguring. There is a small chance that tumors in people with NF1 will become cancerous. Tumors that become cancerous may need to be treated with: ? Surgery. ? Radiation. ? Medicines that kill cancer cells (chemotherapy). ? Combination of surgery, radiation, or chemotherapy. Follow these instructions at home: Medicines  Take over-the-counter and prescription medicines only as told by your health care provider.  Do not drive or use heavy machinery while taking prescription pain medicine.  If you are taking prescription pain medicine, take actions to prevent or treat constipation. Your health care provider may recommend that you: ? Drink enough fluid to keep your urine pale yellow. ? Eat foods that are high in fiber, such as fresh fruits and vegetables, whole grains, and beans. ? Limit foods that are high in fat and processed sugars, such as fried or sweet foods. ? Take an over-the-counter or prescription medicine for constipation. General instructions  Avoid activities that may put you at risk for injury. This is especially important if you have dizziness or seizures. Ask your health care provider what activities are safe for you.  Pay attention to any changes in your symptoms. Tell your health care provider about any changes or new symptoms.  Keep all follow-up visits as told by your health care provider. This is important. This may include visits for tests, therapy, or counseling. Contact a health care provider if:  You develop any new symptoms.  Your symptoms get worse. Get help right away if:  You have a seizure.  You have a severe headache.  You have a sudden change in vision, balance, or hearing. Summary  Neurofibromatosis is a rare genetic disorder that causes skin and bone changes and the development of nerve tumors.  The tumors vary depending on the type of neurofibromatosis you have, but they are usually not cancerous (are  usually benign).  Your health care provider can diagnose the condition based on your symptoms and a physical exam.  There is no cure for this condition. Treatment for your symptoms will be done by a team of health care specialists. It may include medicines, therapy, and surgery. This information is not intended to replace advice given to you by your health care provider. Make sure you discuss any questions you have with your health care provider. Document Released: 02/24/2014 Document Revised: 09/11/2017 Document Reviewed: 08/22/2017 Elsevier Patient Education  2020 [employer].

## 2019-08-12 ENCOUNTER — Telehealth: Payer: Self-pay | Admitting: Neurology

## 2019-08-12 NOTE — Telephone Encounter (Signed)
Patient calling and need a copy of her Genetic testing labs.

## 2019-08-17 NOTE — Telephone Encounter (Signed)
I called [facility] at [phone] and spoke with Jess. She will fax the results to our office at [phone].

## 2019-08-17 NOTE — Telephone Encounter (Signed)
Patient calling in requesting results for her Genetic Testing, she is suppose to bring results with her on 01/08 when she goes to [facility].

## 2019-08-18 ENCOUNTER — Encounter: Payer: Self-pay | Admitting: *Deleted

## 2019-08-18 NOTE — Telephone Encounter (Signed)
We have not received the fax. I called [facility] again at [phone] and spoke with the representative. She will fax the results to our office at [phone].

## 2019-08-18 NOTE — Telephone Encounter (Signed)
[facility] genetic test results for NF type 1 were negative. I messaged the pt in mychart to let her know. She can come by the office to pickup a copy.

## 2020-02-02 ENCOUNTER — Encounter: Payer: Self-pay | Admitting: Physician Assistant

## 2020-03-09 ENCOUNTER — Encounter: Payer: Self-pay | Admitting: Physician Assistant

## 2020-03-09 ENCOUNTER — Ambulatory Visit (INDEPENDENT_AMBULATORY_CARE_PROVIDER_SITE_OTHER): Payer: Medicare (Managed Care) | Admitting: Physician Assistant

## 2020-03-09 VITALS — BP 152/86 | HR 83 | Ht 66.0 in | Wt 227.6 lb

## 2020-03-09 DIAGNOSIS — K625 Hemorrhage of anus and rectum: Secondary | ICD-10-CM

## 2020-03-09 MED ORDER — SUTAB 1479-225-188 MG PO TABS: 1.0000 | ORAL_TABLET | Freq: Once | ORAL | 0 refills | Status: AC

## 2020-03-09 NOTE — Patient Instructions (Signed)
If you are age 52 or older, your body mass index should be between 23-30. Your Body mass index is 36.74 kg/m. If this is out of the aforementioned range listed, please consider follow up with your Primary Care Provider.  If you are age 56 or younger, your body mass index should be between 19-25. Your Body mass index is 36.74 kg/m. If this is out of the aformentioned range listed, please consider follow up with your Primary Care Provider.   You have been scheduled for a colonoscopy. Please follow written instructions given to you at your visit today.  Please pick up your prep supplies at the pharmacy within the next 1-3 days. If you use inhalers (even only as needed), please bring them with you on the day of your procedure.  Due to recent changes in healthcare laws, you may see the results of your imaging and laboratory studies on MyChart before your provider has had a chance to review them.  We understand that in some cases there may be results that are confusing or concerning to you. Not all laboratory results come back in the same time frame and the provider may be waiting for multiple results in order to interpret others.  Please give [place] 48 hours in order for your provider to thoroughly review all the results before contacting the office for clarification of your results.

## 2020-03-09 NOTE — Progress Notes (Signed)
Chief Complaint: Rectal bleeding  HPI:    Emma Munoz is a 54 year old African-American female with a past medical history as listed below including gastroparesis, reflux, IBS, SBO history and stenosis of the colon, known to Dr. Hilarie Fredrickson, who presents to clinic today as a referral from Chesley Noon, MD for a complaint of rectal bleeding.      01/20/2018 patient seen in clinic and at that time being evaluated for her elevated liver enzymes.  It was discussed this is likely fatty liver in the setting of dyslipidemia, hypertension and diabetes.  Further labs were ordered as well as an ultrasound.  Strongly recommend that she have her colonoscopy for screening which she had been avoiding.    01/20/2018 liver enzymes remain elevated but there is no evidence for autoimmune liver disease, iron overload, genetically related liver disease.    01/24/2018 ultrasound showed fatty liver which was likely the cause of her elevated LFTs.  Her hyperparathyroid was discussed and is that possibly her calcium was contributing.    01/30/2020 patient seen in the ED for bright red blood per rectum.  CT without acute findings.  Hemoglobin 17.5 at that point.    Today, the patient presents to clinic accompanied by her husband who does assist with her history.  She explains that her husband made her go to the ER after she had some bright red blood when she wiped and in the toilet about a month and a half ago.  Tells me that she has regional pain syndrome and is on "massive" painkillers and was trying to wean herself off of these and developed diarrhea for about 2 to 3 days and then started with some bright red blood when she would wipe and then blood that filled the toilet.  This occurred for 3 to 4 days and she went to the ER.  She has had none since and is having normal bowel movements and no further abdominal pain at this time.    Denies fever, chills, weight loss or symptoms thatawaken her from sleep.  Past Medical  History:  Diagnosis Date  . Anemia   . CRPS (complex regional pain syndrome type II)   . Depression   . Gastroparesis   . GERD (gastroesophageal reflux disease)   . Heart murmur   . Herniated cervical disc   . Hyperglycemia   . Hyperlipidemia   . Hyperparathyroidism ([place])   . Hypertension   . IBS (irritable bowel syndrome)   . Insomnia   . Metabolic syndrome X   . Migraines   . Obesity   . Primary hyperparathyroidism ([place])   . Reflex sympathetic dystrophy   . SBO (small bowel obstruction) ([place])   . 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 Tobacco abuse     Past Surgical History:  Procedure Laterality Date  . BACK SURGERY    . BOWEL RESECTION  04/25/2012   Procedure: SMALL BOWEL RESECTION;  Surgeon: Leighton Ruff, MD;  Location: [place];  Service: General;  Laterality: N/A;  small bowell resection, incisional hernia repair  . CESAREAN SECTION  85, 87, 95  . CHOLECYSTECTOMY  11/2011   Dr. Bary Castilla [facility]) - bowel perforation and repair  . COLLATERAL LIGAMENT REPAIR, KNEE  1987; 1990's   left  . HERNIA REPAIR  04/2012; 05/13/2012   incisional  . Hystrectomy  2005  . IMPLANTATION VAGAL NERVE STIMULATOR  07/2011   "lower back" (05/13/2012)  . INCISIONAL HERNIA  REPAIR  04/25/2012   Procedure: HERNIA REPAIR INCISIONAL;  Surgeon: Leighton Ruff, MD;  Location: [place];  Service: General;  Laterality: N/A;  . LAPAROTOMY N/A 01/07/2013   Procedure: EXPLORATORY LAPAROTOMY,  LYSIS OF ADHESIONS , SMALL BOWEL RESECTION;  Surgeon: Leighton Ruff, MD;  Location: [place];  Service: General;  Laterality: N/A;  . [place]; 2000  . MYOMECTOMY  [id number]   "multiple; mostly w/scopes" (05/13/2012)  . PARATHYROIDECTOMY N/A 04/10/2018   Procedure: PARATHYROIDECTOMY WITH NECK EXPLORATION;  Surgeon: Armandina Gemma, MD;  Location: [facility];  Service: General;  Laterality: N/A;  . SHOULDER ARTHROSCOPY W/ ROTATOR CUFF REPAIR  ~ 2005   left  . TUBAL  LIGATION  1995  . ULNAR NERVE REPAIR  1998   left "got my hand crushed"    Current Outpatient Medications  Medication Sig Dispense Refill  . alendronate (FOSAMAX) 70 MG tablet Take 70 mg by mouth every Sunday.   0  . amLODipine-olmesartan (AZOR) 10-40 MG tablet Take 1 tablet by mouth daily.    . cetirizine (ZYRTEC) 10 MG tablet Take 10 mg by mouth daily.    . cyclobenzaprine (FLEXERIL) 10 MG tablet Take 20 mg by mouth at bedtime.     Marland Kitchen morphine (MS CONTIN) 30 MG 12 hr tablet Take 30 mg by mouth 2 (two) times daily.     Marland Kitchen morphine (MSIR) 30 MG tablet Take 30 mg by mouth 3 (three) times daily as needed (for breakthrough pain.).    Marland Kitchen olmesartan (BENICAR) 40 MG tablet TAKE ONE TABLET (40 MG DOSE) BY MOUTH DAILY. **DISCONTINUE AZOR AND METOPROLOL**    . omeprazole (PRILOSEC) 40 MG capsule Take 1 capsule (40 mg total) by mouth daily. 90 capsule 3  . OYSCO 500 500 MG TABS Take 500 mg by mouth daily.   2  . TRESIBA FLEXTOUCH 200 UNIT/ML SOPN Inject 50 Units into the skin daily.  5   No current facility-administered medications for this visit.    Allergies as of 03/09/2020 - Review Complete 07/21/2019  Allergen Reaction Noted  . Aspirin Anaphylaxis and Other (See Comments)   . Dilaudid [hydromorphone hcl] Nausea And Vomiting 04/06/2012  . Ibuprofen Anaphylaxis and Other (See Comments)   . Nsaids Anaphylaxis and Other (See Comments)   . Gadolinium derivatives Nausea And Vomiting and Other (See Comments) 03/26/2012  . Latex Hives 03/27/2012  . Metformin and related Nausea And Vomiting 04/04/2018  . Statins Other (See Comments) 01/18/2016    Family History  Problem Relation Age of Onset  . Early death Father 25       GUNSHOT  . Kidney disease Brother        s/p transplant  . Other Mother        NF 1 positive  . Heart disease Maternal Grandmother        Heart defect  . Prostate cancer Maternal Grandfather   . Cancer Maternal Grandfather        prostate  . Other Daughter        NF 1  positive  . Other Granddaughter        NF 1 positive    Social History   Socioeconomic History  . Marital status: Married    Spouse name: Not on file  . Number of children: 3  . Years of education: Not on file  . Highest education level: Not on file  Occupational History  . Occupation: Retired  Tobacco Use  . Smoking status: Former Smoker  Packs/day: 1.00    Years: 24.00    Pack years: 24.00    Types: Cigarettes    Quit date: 2015    Years since quitting: 6.5  . Smokeless tobacco: Never Used  . Tobacco comment: 12/22/2012 offered smoking cessation materials; pt declines  Vaping Use  . Vaping Use: Never used  Substance and Sexual Activity  . Alcohol use: No    Comment: Rarely  . Drug use: No  . Sexual activity: Yes    Birth control/protection: Surgical  Other Topics Concern  . Not on file  Social History Narrative   Lives with husband and 17YO daughter. 1 dog. Disabled after left arm injury.   Right handed   Social Determinants of Health   Financial Resource Strain:   . Difficulty of Paying Living Expenses:   Food Insecurity:   . Worried About Charity fundraiser in the Last Year:   . Arboriculturist in the Last Year:   Transportation Needs:   . Film/video editor (Medical):   Marland Kitchen Lack of Transportation (Non-Medical):   Physical Activity:   . Days of Exercise per Week:   . Minutes of Exercise per Session:   Stress:   . Feeling of Stress :   Social Connections:   . Frequency of Communication with Friends and Family:   . Frequency of Social Gatherings with Friends and Family:   . Attends Religious Services:   . Active Member of Clubs or Organizations:   . Attends Archivist Meetings:   Marland Kitchen Marital Status:   Intimate Partner Violence:   . Fear of Current or Ex-Partner:   . Emotionally Abused:   Marland Kitchen Physically Abused:   . Sexually Abused:     Review of Systems:    Constitutional: No weight loss, fever or chills Cardiovascular: No chest  pain Respiratory: No SOB  Gastrointestinal: See HPI and otherwise negative   Physical Exam:  Vital signs: BP (!) 152/86   Pulse 83   Ht 5\' 6"  (1.676 m)   Wt (!) 227 lb 9.6 oz (103.2 kg)   SpO2 97%   BMI 36.74 kg/m   Constitutional:   AA female appears to be in NAD, Well developed, Well nourished, alert and cooperative Respiratory: Respirations even and unlabored. Lungs clear to auscultation bilaterally.   No wheezes, crackles, or rhonchi.  Cardiovascular: Normal S1, S2. No MRG. Regular rate and rhythm. No peripheral edema, cyanosis or pallor.  Gastrointestinal:  Soft, nondistended, nontender. No rebound or guarding. Normal bowel sounds. No appreciable masses or hepatomegaly. Rectal:  Declined Psychiatric:  Demonstrates good judgement and reason without abnormal affect or behaviors.  See HPI for recent labs and imaging.  Assessment: 1.  Rectal bleeding: For 3 to 4 days about a month and a half ago after diarrhea, now resolved, reports last colonoscopy 15 years ago (we do not have records); consider hemorrhoids versus polyps versus cancer  Plan: 1.  Scheduled patient for a diagnostic colonoscopy in the [place] with Dr. Hilarie Fredrickson.  Did discuss risks, benefits, limitations and alternatives and the patient agrees to proceed.  She will be Covid tested 2 days prior to time of procedure.  Patient requested sutab. 2.  Patient to follow in clinic per recommendations from Dr. Hilarie Fredrickson after time of procedure.  Ellouise Newer, PA-C [facility] 03/09/2020, 9:00 AM  Cc: Chesley Noon, MD

## 2020-03-09 NOTE — Addendum Note (Signed)
Addended by: Horris Latino on: 03/09/2020 10:06 AM   Modules accepted: Orders

## 2020-03-22 NOTE — Progress Notes (Signed)
Addendum: Reviewed and agree with assessment and management plan. Lux Skilton M, MD  

## 2020-03-28 ENCOUNTER — Ambulatory Visit (INDEPENDENT_AMBULATORY_CARE_PROVIDER_SITE_OTHER): Payer: Medicare (Managed Care)

## 2020-03-28 ENCOUNTER — Other Ambulatory Visit: Payer: Self-pay | Admitting: Internal Medicine

## 2020-03-28 DIAGNOSIS — Z1159 Encounter for screening for other viral diseases: Secondary | ICD-10-CM

## 2020-03-29 LAB — SARS CORONAVIRUS 2 (TAT 6-24 HRS): SARS Coronavirus 2: NEGATIVE

## 2020-03-30 ENCOUNTER — Encounter: Payer: Self-pay | Admitting: Internal Medicine

## 2020-03-30 ENCOUNTER — Other Ambulatory Visit: Payer: Self-pay

## 2020-03-30 ENCOUNTER — Ambulatory Visit (AMBULATORY_SURGERY_CENTER): Payer: Medicare (Managed Care) | Admitting: Internal Medicine

## 2020-03-30 VITALS — BP 126/87 | HR 78 | Temp 97.8°F | Resp 16 | Ht 66.0 in | Wt 227.0 lb

## 2020-03-30 DIAGNOSIS — K649 Unspecified hemorrhoids: Secondary | ICD-10-CM | POA: Diagnosis not present

## 2020-03-30 DIAGNOSIS — D179 Benign lipomatous neoplasm, unspecified: Secondary | ICD-10-CM

## 2020-03-30 DIAGNOSIS — D120 Benign neoplasm of cecum: Secondary | ICD-10-CM | POA: Diagnosis not present

## 2020-03-30 DIAGNOSIS — D122 Benign neoplasm of ascending colon: Secondary | ICD-10-CM

## 2020-03-30 DIAGNOSIS — K625 Hemorrhage of anus and rectum: Secondary | ICD-10-CM | POA: Diagnosis not present

## 2020-03-30 MED ORDER — SODIUM CHLORIDE 0.9 % IV SOLN: 500.0000 mL | Freq: Once | INTRAVENOUS | Status: DC

## 2020-03-30 NOTE — Progress Notes (Signed)
VS-CW 

## 2020-03-30 NOTE — Progress Notes (Signed)
Report given to PACU, vss 

## 2020-03-30 NOTE — Progress Notes (Signed)
Called to room to assist during endoscopic procedure.  Patient ID and intended procedure confirmed with present staff. Received instructions for my participation in the procedure from the performing physician.  

## 2020-03-30 NOTE — Op Note (Signed)
[facility] Patient Name: Emma Munoz Procedure Date: 03/30/2020 11:45 AM MRN: [id number] Endoscopist: Jerene Bears , MD Age: 54 Referring MD:  Date of Birth: 01-16-1966 Gender: Female Account #: [id number] Procedure:                Colonoscopy Indications:              Rectal bleeding Medicines:                Monitored Anesthesia Care Procedure:                Pre-Anesthesia Assessment:                           - Prior to the procedure, a History and Physical                            was performed, and patient medications and                            allergies were reviewed. The patient's tolerance of                            previous anesthesia was also reviewed. The risks                            and benefits of the procedure and the sedation                            options and risks were discussed with the patient.                            All questions were answered, and informed consent                            was obtained. Prior Anticoagulants: The patient has                            taken no previous anticoagulant or antiplatelet                            agents. ASA Grade Assessment: II - A patient with                            mild systemic disease. After reviewing the risks                            and benefits, the patient was deemed in                            satisfactory condition to undergo the procedure.                           After obtaining informed consent, the colonoscope  was passed under direct vision. Throughout the                            procedure, the patient's blood pressure, pulse, and                            oxygen saturations were monitored continuously. The                            Colonoscope was introduced through the anus and                            advanced to the cecum, identified by appendiceal                            orifice and ileocecal valve. The colonoscopy  was                            performed without difficulty. The patient tolerated                            the procedure well. The quality of the bowel                            preparation was good. The ileocecal valve,                            appendiceal orifice, and rectum were photographed. Scope In: 11:48:36 AM Scope Out: 12:08:50 PM Scope Withdrawal Time: 0 hours 16 minutes 22 seconds  Total Procedure Duration: 0 hours 20 minutes 14 seconds  Findings:                 The digital rectal exam was normal.                           A 6 mm polyp was found in the cecum. The polyp was                            sessile. The polyp was removed with a cold snare.                            Resection and retrieval were complete.                           Two sessile polyps were found in the ascending                            colon. The polyps were 4 to 6 mm in size. These                            polyps were removed with a cold snare. Resection  and retrieval were complete.                           There was a small lipoma, 10 mm in diameter, in the                            sigmoid colon.                           Internal hemorrhoids were found during                            retroflexion. The hemorrhoids were small. Complications:            No immediate complications. Estimated Blood Loss:     Estimated blood loss was minimal. Impression:               - One 6 mm polyp in the cecum, removed with a cold                            snare. Resected and retrieved.                           - Two 4 to 6 mm polyps in the ascending colon,                            removed with a cold snare. Resected and retrieved.                           - Small lipoma in the sigmoid colon.                           - Internal hemorrhoids. Recommendation:           - Patient has a contact number available for                            emergencies. The signs and  symptoms of potential                            delayed complications were discussed with the                            patient. Return to normal activities tomorrow.                            Written discharge instructions were provided to the                            patient.                           - Resume previous diet.                           - Continue present medications.                           -  Await pathology results.                           - Repeat colonoscopy is recommended for                            surveillance. The colonoscopy date will be                            determined after pathology results from today's                            exam become available for review. Jerene Bears, MD 03/30/2020 12:13:54 PM This report has been signed electronically.

## 2020-03-30 NOTE — Progress Notes (Signed)
BP  210/116, Labetalol given IV, MD update, vss

## 2020-03-30 NOTE — Patient Instructions (Signed)
Handouts provided on polyps and hemorrhoids.   YOU HAD AN ENDOSCOPIC PROCEDURE TODAY AT THE [facility]:   Refer to the procedure report that was given to you for any specific questions about what was found during the examination.  If the procedure report does not answer your questions, please call your gastroenterologist to clarify.  If you requested that your care partner not be given the details of your procedure findings, then the procedure report has been included in a sealed envelope for you to review at your convenience later.  YOU SHOULD EXPECT: Some feelings of bloating in the abdomen. Passage of more gas than usual.  Walking can help get rid of the air that was put into your GI tract during the procedure and reduce the bloating. If you had a lower endoscopy (such as a colonoscopy or flexible sigmoidoscopy) you may notice spotting of blood in your stool or on the toilet paper. If you underwent a bowel prep for your procedure, you may not have a normal bowel movement for a few days.  Please Note:  You might notice some irritation and congestion in your nose or some drainage.  This is from the oxygen used during your procedure.  There is no need for concern and it should clear up in a day or so.  SYMPTOMS TO REPORT IMMEDIATELY:  Following lower endoscopy (colonoscopy or flexible sigmoidoscopy):  Excessive amounts of blood in the stool  Significant tenderness or worsening of abdominal pains  Swelling of the abdomen that is new, acute  Fever of 100F or higher  For urgent or emergent issues, a gastroenterologist can be reached at any hour by calling [phone]. Do not use MyChart messaging for urgent concerns.    DIET:  We do recommend a small meal at first, but then you may proceed to your regular diet.  Drink plenty of fluids but you should avoid alcoholic beverages for 24 hours.  ACTIVITY:  You should plan to take it easy for the rest of today and you should NOT DRIVE  or use heavy machinery until tomorrow (because of the sedation medicines used during the test).    FOLLOW UP: Our staff will call the number listed on your records 48-72 hours following your procedure to check on you and address any questions or concerns that you may have regarding the information given to you following your procedure. If we do not reach you, we will leave a message.  We will attempt to reach you two times.  During this call, we will ask if you have developed any symptoms of COVID 19. If you develop any symptoms (ie: fever, flu-like symptoms, shortness of breath, cough etc.) before then, please call [phone].  If you test positive for Covid 19 in the 2 weeks post procedure, please call and report this information to us.    If any biopsies were taken you will be contacted by phone or by letter within the next 1-3 weeks.  Please call us at [phone] if you have not heard about the biopsies in 3 weeks.    SIGNATURES/CONFIDENTIALITY: You and/or your care partner have signed paperwork which will be entered into your electronic medical record.  These signatures attest to the fact that that the information above on your After Visit Summary has been reviewed and is understood.  Full responsibility of the confidentiality of this discharge information lies with you and/or your care-partner.  

## 2020-04-01 ENCOUNTER — Telehealth: Payer: Self-pay

## 2020-04-01 NOTE — Telephone Encounter (Signed)
  Follow up Call-  Call back number 03/30/2020 05/04/2019  Post procedure Call Back phone  # [phone]  Permission to leave phone message Yes -  Some recent data might be hidden     Patient questions:  Do you have a fever, pain , or abdominal swelling? No. Pain Score  0 *  Have you tolerated food without any problems? Yes.    Have you been able to return to your normal activities? Yes.    Do you have any questions about your discharge instructions: Diet   No. Medications  No. Follow up visit  No.  Do you have questions or concerns about your Care? No.  Actions: * If pain score is 4 or above: No action needed, pain <4.  1. Have you developed a fever since your procedure? no  2.   Have you had an respiratory symptoms (SOB or cough) since your procedure? no  3.   Have you tested positive for COVID 19 since your procedure no  4.   Have you had any family members/close contacts diagnosed with the COVID 19 since your procedure?  no   If yes to any of these questions please route to Joylene John, RN and Joella Prince, RN

## 2020-04-01 NOTE — Telephone Encounter (Signed)
1st follow up call made.  NAULM 

## 2020-04-04 ENCOUNTER — Encounter: Payer: Self-pay | Admitting: Internal Medicine

## 2021-08-02 ENCOUNTER — Encounter: Payer: Self-pay | Admitting: *Deleted

## 2021-09-11 ENCOUNTER — Ambulatory Visit: Payer: Medicare (Managed Care) | Admitting: Internal Medicine

## 2023-01-04 ENCOUNTER — Telehealth: Payer: Self-pay | Admitting: *Deleted

## 2023-01-04 NOTE — Telephone Encounter (Signed)
Patient was contacted to schedule follow up colonoscopy due to history of adenomatous colon polyps. Procedure would need to be completed at the hospital due to patient's history of difficult intubation. Unfortunately, patient indicates that she has [facility] which is out of network for our practice. She notes that she also has medicaid. I spoke with our insurance coordinator who states that we will still provide services for the patient and file insurance but out of network benefits likely will not be covered well. Medicaid would also be billed and they may pay some additional fees, but again, services still may not be covered as well as an in network provider for the patient. Patient verbalizes understanding of this. She indicates that she will think about whether she would rather have follow up with [place] or find a new in network gi provider. She will call us back with her decision.
